# Patient Record
Sex: Female | Born: 1935 | Race: Black or African American | Hispanic: No | Smoking: Never smoker
Health system: Southern US, Community
[De-identification: ages and names within clinical notes are randomized; demographics above are authoritative.]

## PROBLEM LIST (undated history)

## (undated) DIAGNOSIS — D892 Hypergammaglobulinemia, unspecified: Secondary | ICD-10-CM

## (undated) DIAGNOSIS — D131 Benign neoplasm of stomach: Secondary | ICD-10-CM

## (undated) DIAGNOSIS — N189 Chronic kidney disease, unspecified: Secondary | ICD-10-CM

## (undated) DIAGNOSIS — G459 Transient cerebral ischemic attack, unspecified: Secondary | ICD-10-CM

## (undated) DIAGNOSIS — K2990 Gastroduodenitis, unspecified, without bleeding: Secondary | ICD-10-CM

## (undated) DIAGNOSIS — I10 Essential (primary) hypertension: Secondary | ICD-10-CM

## (undated) DIAGNOSIS — H8110 Benign paroxysmal vertigo, unspecified ear: Secondary | ICD-10-CM

## (undated) DIAGNOSIS — Z531 Procedure and treatment not carried out because of patient's decision for reasons of belief and group pressure: Secondary | ICD-10-CM

## (undated) DIAGNOSIS — K2970 Gastritis, unspecified, without bleeding: Secondary | ICD-10-CM

## (undated) DIAGNOSIS — IMO0001 Reserved for inherently not codable concepts without codable children: Secondary | ICD-10-CM

## (undated) DIAGNOSIS — H269 Unspecified cataract: Secondary | ICD-10-CM

## (undated) DIAGNOSIS — I639 Cerebral infarction, unspecified: Secondary | ICD-10-CM

## (undated) HISTORY — DX: Hypergammaglobulinemia, unspecified: D89.2

## (undated) HISTORY — DX: Benign paroxysmal vertigo, unspecified ear: H81.10

## (undated) HISTORY — DX: Cerebral infarction, unspecified: I63.9

## (undated) HISTORY — DX: Chronic kidney disease, unspecified: N18.9

## (undated) HISTORY — PX: REPLACEMENT TOTAL KNEE: SUR1224

## (undated) HISTORY — PX: PARTIAL HIP ARTHROPLASTY: SHX733

## (undated) HISTORY — DX: Transient cerebral ischemic attack, unspecified: G45.9

## (undated) HISTORY — DX: Gastroduodenitis, unspecified, without bleeding: K29.90

## (undated) HISTORY — DX: Unspecified cataract: H26.9

## (undated) HISTORY — DX: Benign neoplasm of stomach: D13.1

## (undated) HISTORY — DX: Gastritis, unspecified, without bleeding: K29.70

---

## 1999-08-16 ENCOUNTER — Emergency Department (HOSPITAL_COMMUNITY): Admission: EM | Admit: 1999-08-16 | Discharge: 1999-08-16 | Payer: Self-pay | Admitting: Emergency Medicine

## 1999-08-16 ENCOUNTER — Encounter: Payer: Self-pay | Admitting: Emergency Medicine

## 2000-05-05 ENCOUNTER — Encounter: Admission: RE | Admit: 2000-05-05 | Discharge: 2000-05-05 | Payer: Self-pay | Admitting: *Deleted

## 2001-10-16 ENCOUNTER — Encounter: Payer: Self-pay | Admitting: Emergency Medicine

## 2001-10-16 ENCOUNTER — Emergency Department (HOSPITAL_COMMUNITY): Admission: EM | Admit: 2001-10-16 | Discharge: 2001-10-16 | Payer: Self-pay | Admitting: Emergency Medicine

## 2002-04-09 ENCOUNTER — Encounter: Payer: Self-pay | Admitting: Orthopedic Surgery

## 2002-04-12 ENCOUNTER — Inpatient Hospital Stay (HOSPITAL_COMMUNITY): Admission: RE | Admit: 2002-04-12 | Discharge: 2002-04-27 | Payer: Self-pay | Admitting: Pulmonary Disease

## 2002-04-12 ENCOUNTER — Encounter: Payer: Self-pay | Admitting: Orthopedic Surgery

## 2002-11-19 ENCOUNTER — Other Ambulatory Visit: Admission: RE | Admit: 2002-11-19 | Discharge: 2002-11-19 | Payer: Self-pay | Admitting: Obstetrics and Gynecology

## 2003-02-25 ENCOUNTER — Encounter: Payer: Self-pay | Admitting: Internal Medicine

## 2003-02-25 ENCOUNTER — Encounter: Admission: RE | Admit: 2003-02-25 | Discharge: 2003-02-25 | Payer: Self-pay | Admitting: Internal Medicine

## 2003-09-30 ENCOUNTER — Emergency Department (HOSPITAL_COMMUNITY): Admission: AD | Admit: 2003-09-30 | Discharge: 2003-09-30 | Payer: Self-pay | Admitting: Family Medicine

## 2003-10-25 ENCOUNTER — Emergency Department (HOSPITAL_COMMUNITY): Admission: AD | Admit: 2003-10-25 | Discharge: 2003-10-25 | Payer: Self-pay | Admitting: Family Medicine

## 2004-01-30 ENCOUNTER — Inpatient Hospital Stay (HOSPITAL_COMMUNITY): Admission: AD | Admit: 2004-01-30 | Discharge: 2004-02-10 | Payer: Self-pay | Admitting: Internal Medicine

## 2004-01-30 ENCOUNTER — Encounter: Payer: Self-pay | Admitting: Internal Medicine

## 2004-01-30 DIAGNOSIS — K2970 Gastritis, unspecified, without bleeding: Secondary | ICD-10-CM

## 2004-01-30 DIAGNOSIS — K2990 Gastroduodenitis, unspecified, without bleeding: Secondary | ICD-10-CM

## 2004-01-30 DIAGNOSIS — D131 Benign neoplasm of stomach: Secondary | ICD-10-CM | POA: Insufficient documentation

## 2004-01-30 HISTORY — DX: Gastroduodenitis, unspecified, without bleeding: K29.90

## 2004-01-30 HISTORY — DX: Benign neoplasm of stomach: D13.1

## 2004-01-30 HISTORY — DX: Gastritis, unspecified, without bleeding: K29.70

## 2004-02-03 ENCOUNTER — Encounter (INDEPENDENT_AMBULATORY_CARE_PROVIDER_SITE_OTHER): Payer: Self-pay | Admitting: *Deleted

## 2004-02-18 ENCOUNTER — Encounter (HOSPITAL_COMMUNITY): Admission: RE | Admit: 2004-02-18 | Discharge: 2004-05-18 | Payer: Self-pay | Admitting: Oncology

## 2004-07-17 ENCOUNTER — Encounter: Admission: RE | Admit: 2004-07-17 | Discharge: 2004-07-17 | Payer: Self-pay | Admitting: Surgery

## 2005-01-28 ENCOUNTER — Ambulatory Visit: Payer: Self-pay | Admitting: Internal Medicine

## 2005-02-16 ENCOUNTER — Ambulatory Visit: Payer: Self-pay | Admitting: Oncology

## 2005-03-16 ENCOUNTER — Ambulatory Visit: Payer: Self-pay | Admitting: Internal Medicine

## 2005-04-07 ENCOUNTER — Inpatient Hospital Stay (HOSPITAL_COMMUNITY): Admission: RE | Admit: 2005-04-07 | Discharge: 2005-04-16 | Payer: Self-pay | Admitting: Orthopedic Surgery

## 2005-04-07 ENCOUNTER — Ambulatory Visit: Payer: Self-pay | Admitting: Physical Medicine & Rehabilitation

## 2005-04-08 ENCOUNTER — Ambulatory Visit: Payer: Self-pay | Admitting: Internal Medicine

## 2005-05-18 ENCOUNTER — Encounter: Admission: RE | Admit: 2005-05-18 | Discharge: 2005-06-10 | Payer: Self-pay | Admitting: Orthopedic Surgery

## 2005-07-08 IMAGING — CR DG CHEST 2V
2 series · 2 of 2 positions shown · non-contrast
Comparison: none

CLINICAL DATA: GI bleeding, anemia, renal insufficiency, preoperative respiratory exam.
 CHEST (TWO VIEWS)
 The heart is at the upper limits of normal in size.  The thoracic aorta is unfolded.  The lungs are clear.  No effusions.  No acute soft tissue or bony finding.
 IMPRESSION
 No active disease.

[view not recorded (1 of 2)]
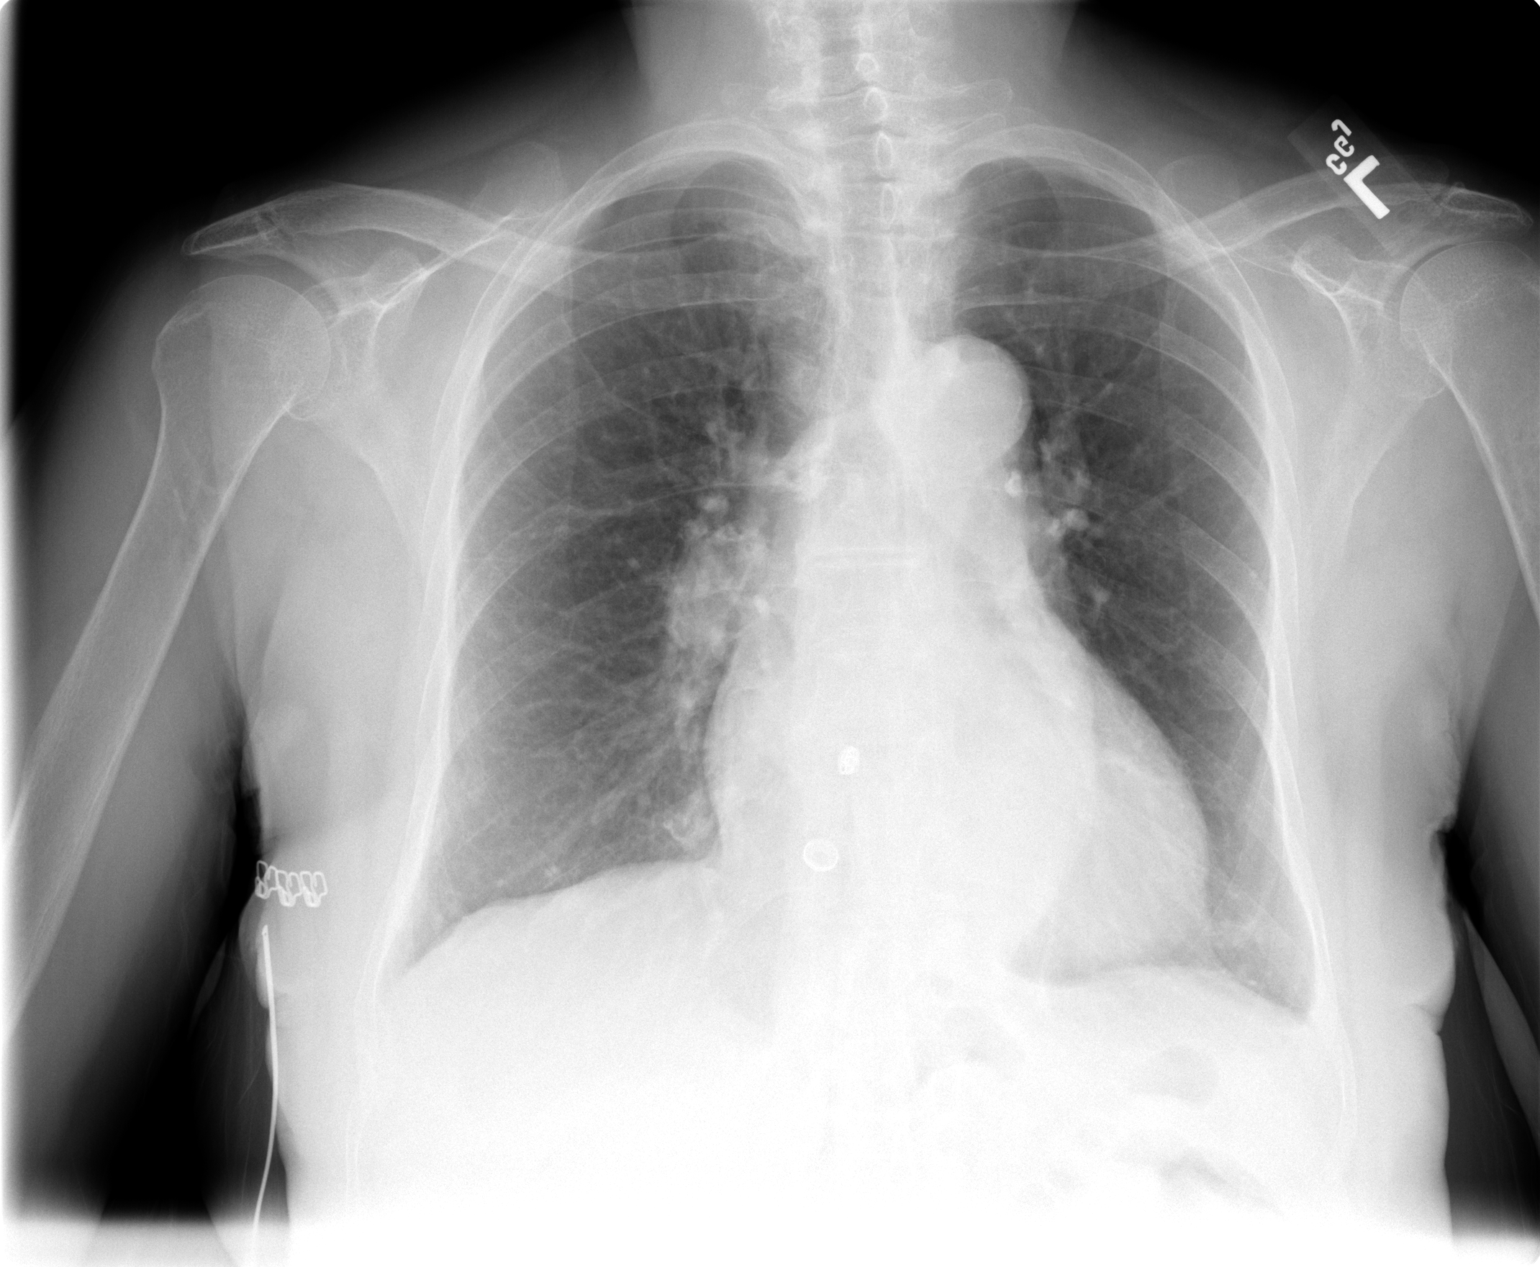

[view not recorded (2 of 2)]
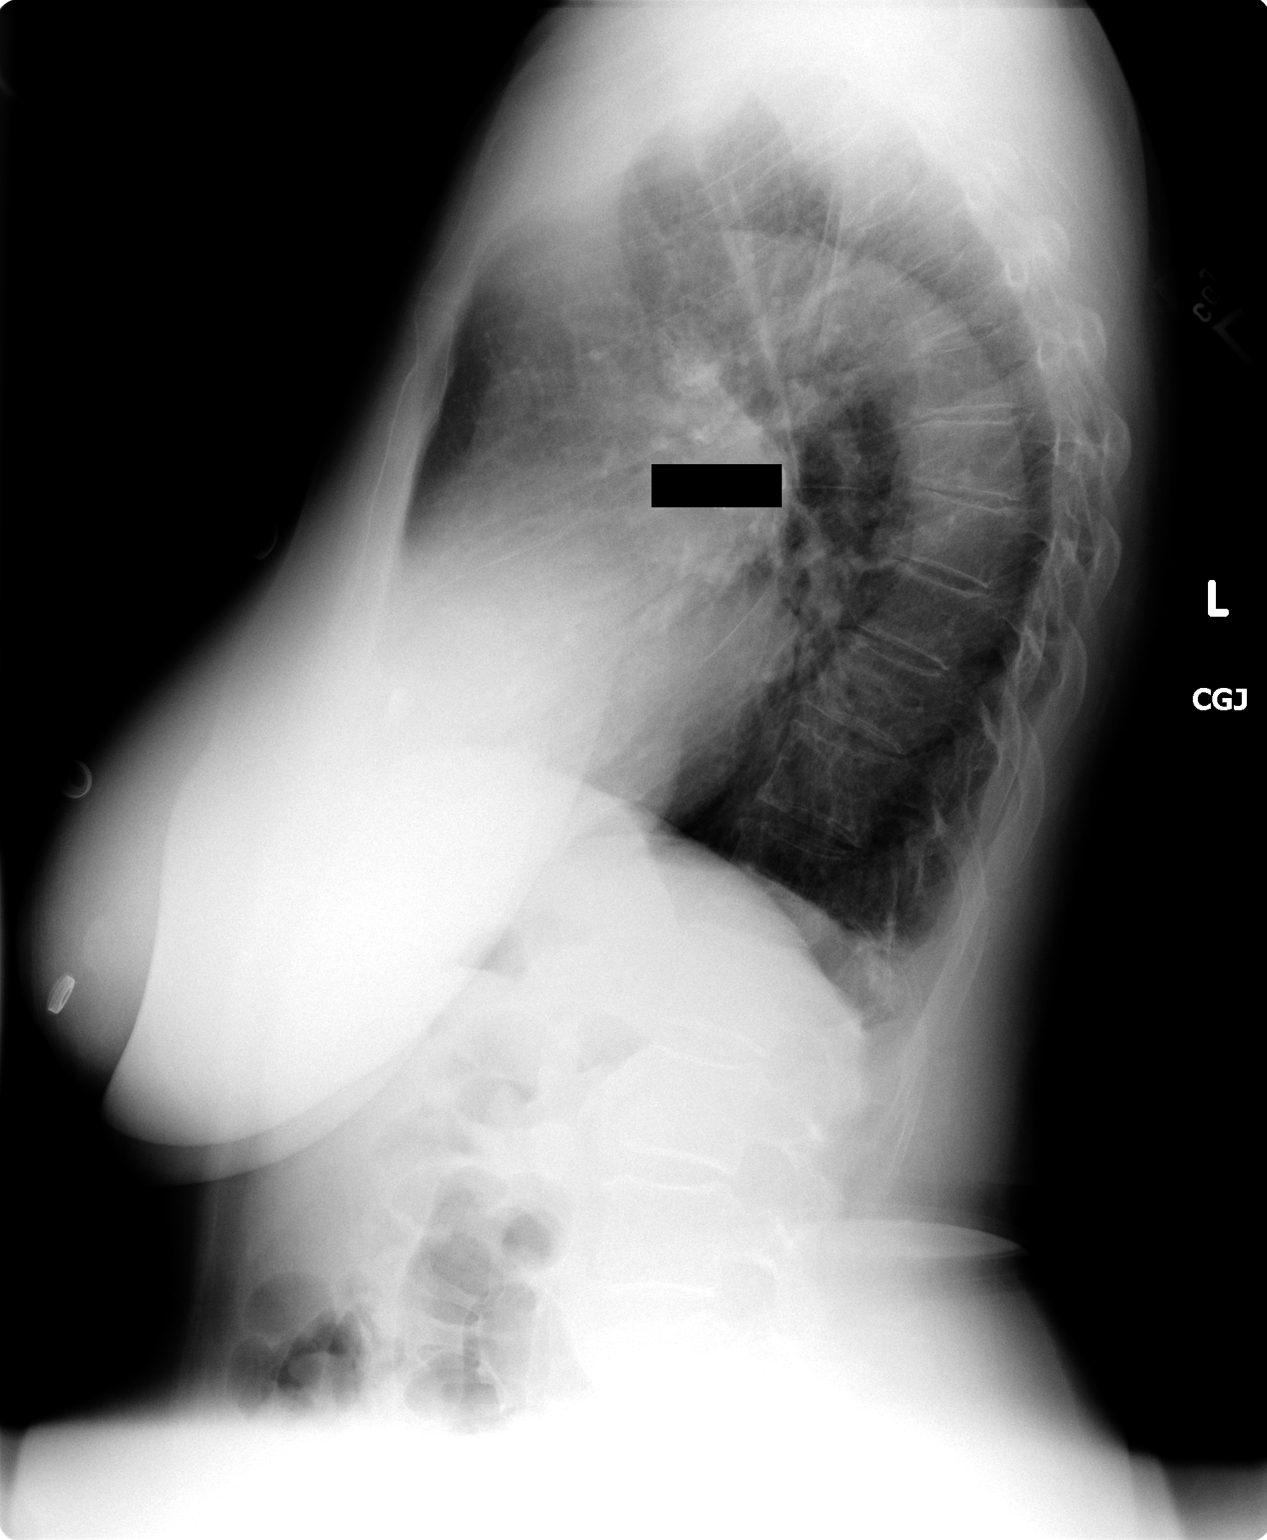

[2 of 2 positions shown; findings below may reference images not displayed]

## 2005-10-04 ENCOUNTER — Ambulatory Visit: Payer: Self-pay | Admitting: Internal Medicine

## 2005-10-08 ENCOUNTER — Ambulatory Visit: Payer: Self-pay | Admitting: Internal Medicine

## 2005-11-05 ENCOUNTER — Encounter: Admission: RE | Admit: 2005-11-05 | Discharge: 2005-11-05 | Payer: Self-pay | Admitting: Internal Medicine

## 2005-11-17 ENCOUNTER — Ambulatory Visit: Payer: Self-pay | Admitting: Endocrinology

## 2005-11-26 ENCOUNTER — Ambulatory Visit: Payer: Self-pay

## 2005-12-08 ENCOUNTER — Ambulatory Visit: Payer: Self-pay | Admitting: Internal Medicine

## 2006-06-08 ENCOUNTER — Encounter: Admission: RE | Admit: 2006-06-08 | Discharge: 2006-06-08 | Payer: Self-pay | Admitting: Orthopedic Surgery

## 2007-07-03 ENCOUNTER — Ambulatory Visit: Payer: Self-pay | Admitting: Internal Medicine

## 2007-07-06 ENCOUNTER — Ambulatory Visit: Payer: Self-pay | Admitting: Internal Medicine

## 2007-08-11 ENCOUNTER — Encounter: Payer: Self-pay | Admitting: *Deleted

## 2007-08-11 DIAGNOSIS — N259 Disorder resulting from impaired renal tubular function, unspecified: Secondary | ICD-10-CM | POA: Insufficient documentation

## 2007-08-11 DIAGNOSIS — E538 Deficiency of other specified B group vitamins: Secondary | ICD-10-CM

## 2007-08-11 DIAGNOSIS — M1990 Unspecified osteoarthritis, unspecified site: Secondary | ICD-10-CM | POA: Insufficient documentation

## 2007-08-11 DIAGNOSIS — K649 Unspecified hemorrhoids: Secondary | ICD-10-CM | POA: Insufficient documentation

## 2007-08-11 DIAGNOSIS — I10 Essential (primary) hypertension: Secondary | ICD-10-CM | POA: Insufficient documentation

## 2007-08-11 DIAGNOSIS — K5909 Other constipation: Secondary | ICD-10-CM

## 2007-11-03 ENCOUNTER — Encounter: Payer: Self-pay | Admitting: Internal Medicine

## 2007-11-03 ENCOUNTER — Ambulatory Visit: Payer: Self-pay | Admitting: Internal Medicine

## 2007-11-06 ENCOUNTER — Emergency Department (HOSPITAL_COMMUNITY): Admission: EM | Admit: 2007-11-06 | Discharge: 2007-11-06 | Payer: Self-pay | Admitting: Emergency Medicine

## 2008-02-08 DIAGNOSIS — H269 Unspecified cataract: Secondary | ICD-10-CM

## 2008-02-08 HISTORY — DX: Unspecified cataract: H26.9

## 2008-03-04 ENCOUNTER — Emergency Department (HOSPITAL_COMMUNITY): Admission: EM | Admit: 2008-03-04 | Discharge: 2008-03-04 | Payer: Self-pay | Admitting: Emergency Medicine

## 2008-06-10 ENCOUNTER — Emergency Department (HOSPITAL_COMMUNITY): Admission: EM | Admit: 2008-06-10 | Discharge: 2008-06-10 | Payer: Self-pay | Admitting: Emergency Medicine

## 2008-07-30 ENCOUNTER — Inpatient Hospital Stay (HOSPITAL_COMMUNITY): Admission: EM | Admit: 2008-07-30 | Discharge: 2008-08-04 | Payer: Self-pay | Admitting: Emergency Medicine

## 2008-07-31 ENCOUNTER — Ambulatory Visit: Payer: Self-pay | Admitting: Internal Medicine

## 2008-08-02 ENCOUNTER — Encounter: Payer: Self-pay | Admitting: Internal Medicine

## 2009-01-02 ENCOUNTER — Ambulatory Visit (HOSPITAL_BASED_OUTPATIENT_CLINIC_OR_DEPARTMENT_OTHER): Admission: RE | Admit: 2009-01-02 | Discharge: 2009-01-03 | Payer: Self-pay | Admitting: Orthopedic Surgery

## 2010-11-29 ENCOUNTER — Encounter: Payer: Self-pay | Admitting: Oncology

## 2011-02-23 LAB — POCT HEMOGLOBIN-HEMACUE: Hemoglobin: 10.6 g/dL — ABNORMAL LOW (ref 12.0–15.0)

## 2011-03-23 NOTE — Assessment & Plan Note (Signed)
[facility]                         GASTROENTEROLOGY OFFICE NOTE   NAME:Rohleder, MAIRE GOVAN                       MRN:          [id number]  DATE:07/03/2007                            DOB:          04-04-1923    ADDENDUM   PAST MEDICAL HISTORY:  B12 deficiency.  Prior appendectomy.  Prior right  total hip replacement.  Cataracts bilaterally.   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     Iva Boop, MD,FACG  Electronically Signed    CEG/MedQ  DD: 07/04/2007  DT: 07/04/2007  Job #: [id number]

## 2011-03-23 NOTE — Op Note (Signed)
NAMECHAROLETTE, BULTMAN                ACCOUNT NO.:  [id number]   MEDICAL RECORD NO.:  [id number]          PATIENT TYPE:  AMB   LOCATION:  NESC                         FACILITY:  [facility]   PHYSICIAN:  Ollen Gross, M.D.    DATE OF BIRTH:  December 17, 1922   DATE OF PROCEDURE:  01/02/2009  DATE OF DISCHARGE:  01/03/2009                               OPERATIVE REPORT   PREOPERATIVE DIAGNOSIS:  Right knee synovitis, questionable infection.   POSTOPERATIVE DIAGNOSES:  Right knee synovitis, questionable infection.   PROCEDURE:  Right knee arthroscopic irrigation, debridement and  synovectomy.   SURGEON:  Ollen Gross, M.D.   ASSISTANT:  None.   ANESTHESIA:  General.   BLOOD LOSS:  Minimal.   DRAIN:  Hemovac times one.   COMPLICATIONS:  None.   CONDITION:  Stable to recovery.   INDICATIONS:  Ms. Lindsay Shannon is an 77 year old female who had a right total  knee arthroplasty done approximately 4 years ago.  She did fine with no  problems at all, but then, a few weeks ago started to develop painful  swelling in the knee spontaneously.  She denied any trauma.  She denied  any recent systemic illnesses.  We did an aspiration in the office,  showing bloody fluid.  The cultures were all negative.  I did a  subsequent aspiration a week later and sent it for cell count also,  which had an elevated white count, but again negative cultures.  Given  her clinical scenario, it was decided to do an irrigation and  debridement arthroscopically.   PROCEDURE IN DETAIL:  After successful administration of general  anesthetic, a tourniquet was placed high on the right thigh and right  lower extremity prepped and draped in the usual sterile fashion.  Standard superomedial and inferolateral incisions were made, inflow  cannula passed superomedial and camera passed inferolateral.  Arthroscopic visualization proceeds.   There is some inflamed synovium in the suprapatellar area.  When we  opened the joint with  the trocars, bloody type fluid came out.  I did  not see any purulence.  We thoroughly irrigated the joint.  Through an  inferomedial portal I then placed a shaver to debride the synovium.  We  got it back to healthy, bleeding tissue.  I used the ArthroCare to  cauterize any bleeding points and to remove any of the remaining  synovium.  After thorough synovectomy was completed, we washed  approximately 12 liters of saline through the joint under pressure.  This effectively cleansed the joint and I then removed the arthroscopic  equipment from the inferior portals, which were closed with interrupted  4-0 nylon.  Then 20 mL of 0.25% Marcaine with epi injected through the  inflow cannula and then a  Hemovac drain was threaded through the cannula.  The cannula was  removed.  The incision was closed with interrupted 4-0 nylon, but the  drain is not sewn in.  The drain is hooked to suction and a bulky  sterile dressing is applied.  She is then awakened and transferred to  recovery in stable condition.  Ollen Gross, M.D.  Electronically Signed     FA/MEDQ  D:  01/02/2009  T:  01/03/2009  Job:  [id number]

## 2011-03-23 NOTE — H&P (Signed)
NAMEPAULETT, KAUFHOLD                ACCOUNT NO.:  [id number]   MEDICAL RECORD NO.:  [id number]          PATIENT TYPE:  INP   LOCATION:  6533                         FACILITY:  [facility]   PHYSICIAN:  Gwen Pounds, MD       DATE OF BIRTH:  1923-09-08   DATE OF ADMISSION:  07/30/2008  DATE OF DISCHARGE:                              HISTORY & PHYSICAL   PRIMARY CARE Rhyanna Sorce:  Vania Rea. Jarold Motto, MD, [facility], F[facility]   GASTROINTESTINAL:  Iva Boop, MD, [facility]   CHIEF COMPLAINT:  Rectal bleed with abdominal pain.   HISTORY OF PRESENT ILLNESS:  This is an 77 year old female who came to  the ED at 7:30 after noting abdominal discomfort, bright blood per  rectum followed by coughing up blood, positive nausea and vomiting  thereafter.  Now currently back to her baseline and no complaints.  She  reports that the bloody bowel movements are over.  The patient is  Jehovah's Witnesses and will not take blood products.  We will admit for  evaluation and treatment.  She has been seen by [facility], Dr. Leone Payor,  in the past and had EGDs and colonoscopies.  Of note, she has got  significant constipation in the past.  Her last colonoscopy, I do not  have results for it now, presumably this showed some polyps,  diverticula, and hemorrhoids.  Currently, feels better with lying down;  if gets up, she gets recurrent symptoms.  No other complaints.  She has  been weak and tired lately.  Four bloody stools at home, she reports.   PAST MEDICAL HISTORY:  1. Hypertension.  2. Constipation, status post taking Amitiza in the past.  3. History of bilateral cataracts.  4. History of right total hip replacement.  5. Chronic renal insufficiency.  6. B12 deficiency.  7. History of appendectomy.  8. History of hemorrhoids.  9. Duodenal stromal cell tumor, status post resection.  10.History of hysterectomy.   ALLERGIES:  No known drug allergies.   MEDICATION LIST:  1. Hydrochlorothiazide 12.5.  2.  Metamucil as needed.  3. Multivitamin p.o. daily.  4. Natural C.  5. Super B-complex.  6. Iron.  7. Potassium 20 mEq p.o. daily.  8. Calcium and D.   SOCIAL HISTORY:  She lives by herself.  No tobacco.  No alcohol.   FAMILY HISTORY:  Old age and tuberculosis.   REVIEW OF SYSTEMS:  She has been weak and tired.  No other complaints  except for what is in the HPI.  Feels better now.   PHYSICAL EXAMINATION:  VITAL SIGNS:  Blood pressure 121/74, heart rate  106, respiratory rate 21, and sating 100% on room air.  GENERAL:  Alert and oriented.  HEENT:  Oropharynx is mildly dry.  PULMONARY:  Clear to auscultation bilaterally.  CARDIAC:  Regular.  ABDOMEN:  Soft, nontender, and nondistended.  Bowel sounds positive.  RECTAL:  Done per ED shows dark stool, heme-positive.  EXTREMITIES:  No edema.   ANCILLARY DATA:  White count 8.6, hemoglobin 10.1, and platelet count  202.  Sodium 146, potassium  3.3, chloride 116, bicarb 22, BUN 31,  creatinine 2.38, glucose 148, and AST 26.  EKG showed sinus rhythm with  first-degree AV block and right bundle-branch block noted.  An i-STAT  was done afterwards and showed hemoglobin 8.8, I do not know the  accuracy of it.   ASSESSMENT:  This is an elderly female Jehovah's Witnesses with a  history of gastrointestinal bleeds, and history of colonoscopy and EGD  per [place] in the last 1 year, being admitted for rectal bleeding and  presumed diverticular bleed.   PLAN:  1. Admit.  2. Telemetry.  3. Follow CBCs.  4. No transfusions.  5. GI consult as needed.  6. If opened up and become hemodynamically unstable, the only options      are surgery, embolization, or comfort measures.  7. Follow blood pressure, hold hydrochlorothiazide, especially with      her creatinine of 2.4.  8. No aspirin, Plavix, or anticoagulation at this current time is      needed or warranted and may make things worse.  9. Follow creatinine.  10.Protonix in case of upper GI  bleed, although I doubt.  11.SCDs for DVT prophylaxis.  12.Dr. Jarold Motto will see her in the morning and further aggressive      care.  Clear liquids for now.      Gwen Pounds, MD  Electronically Signed     JMR/MEDQ  D:  07/30/2008  T:  07/31/2008  Job:  [id number]

## 2011-03-23 NOTE — Assessment & Plan Note (Signed)
[facility]                         GASTROENTEROLOGY OFFICE NOTE   NAME:Lindsay Shannon, Lindsay Shannon                       MRN:          [id number]  DATE:07/03/2007                            DOB:          01-07-1923    REFERRING PHYSICIAN:  Barry Dienes. Eloise Harman, M.D.   CHIEF COMPLAINT:  Heme-positive stool.   ASSESSMENT:  An 77 year old African-American woman who is having  problems with constipation, improved on Amitiza and also with recent  Hemoccult positive stool in May.  She has had an EGD demonstrating  stromal cell tumor January 11, 2003 status post resection.  That was in the  duodenum and has had a colonoscopy at that time, January 11, 2003,  demonstrating mixed hemorrhoids.   PLAN:  She needs a repeat GI workup given the constipation and recurring  heme-positive stool/changed bowel habits.  Plan for colonoscopy July 06, 2007.   Risks, benefits, and indications reviewed and explained.  She  understands and agrees to proceed.  If that is unrevealing, further  workup could include CT scanning, looking for any recurrent stromal cell  tumor and upper GI endoscopy.   HISTORY:  As above, this lady has developed problems with constipation.  She has been started on Amitiza and it is better.  She has had some left  lower quadrant discomfort, it sounds like, which improves when she  empties her bowels.  Her GI review of systems is otherwise negative.  Stool hemoccults coordinated by Dr. Eloise Harman were positive in May.  Laboratory investigation May 22, 2007 shows normal hemoglobin and  hematocrit.  Creatinine 2.4 at that time.   MEDICATIONS:  1. GlycoLax p.r.n.  2. Amitiza 24 mcg daily.   DRUG ALLERGIES:  None known.   PAST MEDICAL HISTORY:  1. Renal insufficiency.  2. Mixed hemorrhoids.  3. Duodenal stromal cell tumor status post resection.  4. Osteoarthritis in the right knee with right total knee arthroplasty      Apr 07, 2005.  5. Hypertension.  6.  History of constipation in the past as well.  7. Prior hysterectomy.  8. B12 deficiency.  9. Prior appendectomy.  10.Prior right total hip replacement.  11.Cataracts bilaterally.   FAMILY HISTORY:  Noncontributory.  No colon cancer.   SOCIAL HISTORY:  Francis Dowse is widowed.  She is here with a friend.  No  alcohol, tobacco, or drugs.  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   REVIEW OF SYSTEMS:  See my medical history form for full details.   PHYSICAL EXAM:  Reveals a spry elderly black woman looking somewhat  younger than stated age.  Weight 126 pounds, blood pressure 138/78, pulse 68.  Eyes anicteric.  HENT:  Dentures.  Otherwise, free of oral lesions.  No posterior  pharyngeal lesions.  NECK:  Supple.  No thyromegaly or mas.  CHEST:  Clear.  HEART:  S1, S2.  No murmurs, rubs, or gallops.  ABDOMEN:  Soft and nontender without organomegaly or mass.  RECTAL:  Deferred at this time.  LYMPHATICS:  No neck or supraclavicular nodes.  EXTREMITIES:  No peripheral edema  noted.  SKIN:  No acute rash.  Warm and dry.  PSYCH:  She is alert and oriented x3.   I appreciate the opportunity to care for this patient.     Iva Boop, MD,FACG  Electronically Signed    CEG/MedQ  DD: 07/04/2007  DT: 07/04/2007  Job #: [id number]   cc:   Barry Dienes. Eloise Harman, M.D.

## 2011-03-26 NOTE — Op Note (Signed)
[place]  Patient:    Lindsay, Shannon Visit Number: [id number] MRN: [id number]          Service Type: SUR Location: MICU 2102 01 Attending Physician:  Burnard Bunting Dictated by:   Cammy Copa, M.D. Proc. Date: 04/12/02 Admit Date:  04/12/2002                             Operative Report  PREOPERATIVE DIAGNOSIS:  Right hip arthritis.  POSTOPERATIVE DIAGNOSIS:  Right hip arthritis.  PROCEDURE:  Right total hip arthroplasty.  SURGEON:  Cammy Copa, M.D.  ASSISTANT:  Humberto Leep. Wyonia Hough, M.D.  ANESTHESIA:  General endotracheal.  ESTIMATED BLOOD LOSS:  950 cc.  DRAINS:  None.  DESCRIPTION OF PROCEDURE:  The patient was brought to the operating room where general endotracheal anesthesia was induced.  Preoperative IV antibiotics were administered.  The right hip, leg, and foot was prepped with DuraPrep solution, and draped in a sterile manner.  Collier Flowers was used in the operative field.  A posterior approach to the hip was utilized.  The skin and subcutaneous tissue were sharply divided.  Bleeding points were controlled using electrocautery.  The fascia lata was identified and divided over the greater trochanteric region.  The bursa was incised.  The external rotators were identified including the piriformis tendon.  The piriformis tendon was tagged and then detached.  The other external rotators were then detached which showed the capsule.  The capsule was split in a T-shaped manner.  At this time, a Steinmann pin was placed into the superior acetabular roof, and a drill bit was placed into the trochanter to measure leg length which was approximately 60 mm.  The hip was then removed and then the hip was dislocated.  A femoral neck cut was made with the oscillating saw about one fingerbreadth above the superior aspect of the lesser trochanter.  With the femoral neck cut made, the anterior acetabular retractor was placed.   The acetabular labrum was removed and the osteophytes around the anterior and posterior walls of the acetabulum were also removed with an osteotome.  The patient had a lot of _______ as well as one loose body in the hip joint itself which was removed.  At this time, the acetabulum was reamed up to a size 54, and according to the preoperative templating.  A 54 trial cup gave a good interfering fit.  The real hydroxyapatite-coated cup was then placed and two screws measuring 16 mm and 20 mm were placed to secure the cup into the posterior-superior quadrant of the acetabulum.  At this time, the femur was prepared.  A sequential cylindrical reaming and broaching was performed.  With a cement 7 broach in position, the hip was reduced with a +0 and +5 neck length.  The +5 neck length gave the best soft tissue tension and was stable on external rotation and full extension, and was also stable in a position of sleep as well as 90 degrees of hip flexion, slight adduction, and internal rotation to about 70 degrees.  At this time, the canal was prepared with a distal cement spacer.  Cement was then placed and a size 7 broach and a size 7 stem was cemented into position in approximately 15 degrees of anteversion.  The cement was allowed to harden. Trial reduction was again performed with the +0 and +5 head.  With the +5 head in position,  the patient was noted to be lengthened by about a centimeter. Preoperatively, he was noted to be about a quarter inch short on that side, and thus this was felt to be acceptable.  At this time, the true +5, 32 mm head was then tapped into [facility] taper stem, and the hip was reduced.  The true trial 10 mm trial liner was placed prior to cementing the stem into position.  At this time, the hip was thoroughly irrigated.  The capsule was closed using the #1 Ethibond suture.  The fascia lata was then closed using #1 Vicryl figure-of-eight sutures.  The skin was closed  using interrupted inverted 2-0 Vicryl and skin staples.  Impervious dressing and a pressure wrap was applied over the hip region.  The patient tolerated the procedure well without any immediate complications. A knee immobilizer was placed.  He was transferred to the recovery room in stable condition. Dictated by:   Cammy Copa, M.D. Attending Physician:  Burnard Bunting DD:  04/12/02 TD:  04/15/02 Job: [id number]

## 2011-03-26 NOTE — Discharge Summary (Signed)
[place]  Patient:    Lindsay Shannon, Lindsay Shannon Visit Number: [id number] MRN: [id number]          Service Type: SUR Location: 5000 [id number] Attending Physician:  Silvio Pate Dictated by:   Cammy Copa, M.D. Admit Date:  04/12/2002 Disc. Date: 04/27/02                             Discharge Summary  DISCHARGE DIAGNOSIS:  Right hip osteoarthritis status post total hip arthroplasty.  SECONDARY DIAGNOSIS:  Postoperative anemia.  OPERATIONS AND PROCEDURES:  Right total hip arthroplasty performed April 12, 2002.  CONSULTATIONS: 1. Critical care. 2. Cardiology.  HISTORY OF PRESENT ILLNESS:  The patient is a 77 year old [place] Witness who had right hip arthritis.  Her medical history is otherwise healthy.  HOSPITAL COURSE:  The patient was admitted to the orthopedic service on April 12, 2002.  At that time, she underwent total hip arthroplasty.  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  This was managed by a five day stay in the intensive care unit. Critical care consultation was obtained and EPO protocol was initiated.  The patient did go into transient atrial fibrillation on postoperative day three which resolved with medication. She was maintained on Lovenox for DVT prophylaxis.  She was transferred to the floor on April 18, 2002.  The patient was started with physical therapy for immobilization.  Incision was intact during the hospitalization. Dorsiflexion and plantar flexion of the feet were also intact.  The patients hemoglobin increased to 7.4 at the time of discharge.  She had no orthostatic hypertension and was ambulating in the halls and performing stairs at that time.  Bilateral lower extremity ultrasounds were negative for deep vein thrombosis.  She was switched from Lovenox to Coumadin on the day of discharge. She will continue with ambulation, weight bearing as tolerated. She will be  transferred to [facility] for two to three weeks and then home thereafter.  I will see her in follow up in three to four weeks.  DISCHARGE MEDICATIONS:  Admission medications plus iron and Coumadin.Dictated by:   Cammy Copa, M.D. Attending Physician:  Silvio Pate DD:  04/27/02 TD:  04/27/02 Job: [id number]

## 2011-03-26 NOTE — Consult Note (Signed)
NAME:  Lindsay Shannon, Lindsay Shannon                          ACCOUNT NO.:  [id number]   MEDICAL RECORD NO.:  [id number]                   PATIENT TYPE:  INP   LOCATION:  5736                                 FACILITY:  [facility]   PHYSICIAN:  Valentino Hue. Magrinat, M.D.            DATE OF BIRTH:  07-05-1923   DATE OF CONSULTATION:  DATE OF DISCHARGE:                                   CONSULTATION   We are asked to see this 77 year old female with a history of anemia and  abnormal serum protein electrophoresis.   HISTORY:  Lindsay Shannon is a 77 year old female who is __________ and has a  history of hypertension and degenerative joint disease.  She was admitted on  the 24th of March 2005 after 10 days of jelly-type hematochezia.  Upon  initial evaluation, her hemoglobin was approximately 8.4 gm/dl with a  creatinine of 2.0.  Unfortunately, I do not have any old records but it  appears that she progressively had a normal hemoglobin concentration.  She  subsequently was referred to see Lindsay Shannon and she had an EGD  performed which showed a mass in the prepyloric area with some ulcerations.  A test for H. pylori was negative.  She was subsequently admitted to the  inpatient service for surgical resection.  She had an exploratory laparotomy  performed on the 28th of March, 2005, with minimal blood loss.  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  As the patient is a  Jehovah's witness, she has not received any blood transfusions during her  hospital course.  She has been begun on treatment with Epogen 5000 units  three times a week, as well as intravenous iron.   Today, she denies any complaints.  She had good energy levels, having  improved over the last several days.  She is able to ambulate without  shortness of breath and she  denies any chest pain, orthopnea, or paroxysmal  nocturnal dyspnea.  She does have some mild, occasional cough, but denies  any fevers or chills.  She has no nausea, vomiting, or change in bowel  habits.  She denies any back pain, groin pain, but does have occasional  right knee pain which she attributes to her known history of degenerative  joint disease.  She denies any paresthesias.   PAST MEDICAL HISTORY:  1. Hypertension.  2. Degenerative joint disease.  3. Status post right hip replacement.  4. Status post appendectomy.  5. Status post hysterectomy.  6. She had a colonoscopy in March 2004 which was essentially unremarkable     except for internal and external hemorrhoids.   CURRENT MEDICATIONS:  1. Epogen 5000 units subcu three times a week.  2. Iron.  3. Sucrose 100  mg IV q.24h.  4. Protonix 40 mg p.o. q.d.  5. Tylenol 50 mg p.o. q.6h. p.r.n. for pain.  6. Benadryl 25 mg p.o. q.4h. p.r.n.  7. Phenergan 12.5-25 mg IV q.4h. p.r.n. for nausea and vomiting.  8. Senna/Docusate one tablet p.o. q.d.  9. Lortab one tablet p.o. q.4h. p.r.n. for pain.   She has no known drug allergies.   PERSONAL HISTORY:  She is a widow and previously worked as a Games developer.  She has no children and lives alone.  She is a TEFL teacher witness.  She  denies any history of smoking or alcohol abuse.   FAMILY HISTORY:  Significant for her mother dying from complications of  tuberculosis, her father died from unknown cause.  Her sister has a history  of a cancer of the bone and her brother died from complications of throat  cancer.  She has a sister who is currently on dialysis.   REVIEW OF SYSTEMS:  CARDIOVASCULAR:  She denies any chest pain,  palpitations, or syncope.  RESPIRATORY:  She has no shortness of breath,  orthopnea, paroxysmal nocturnal dyspnea, but has a mild, nonproductive  cough.  GI:  She has no nausea or vomiting or change in bowel habits.  The  rest of her systems have been  reviewed and are as per the history of present  illness.   PHYSICAL EXAMINATION:  VITAL SIGNS:  She is afebrile with a temperature of  99.8 degrees Fahrenheit.  Blood pressure 121/70; pulse 82 per minute;  respirations 22 per minute.  Oxygen saturation is 100% on room air.  GENERAL:  She is found to be an elderly female in no acute distress who  looks younger than her stated age.  HEENT:  Her pupils are equal, round, and  reactive to light.  She is anicteric with pale conjunctivae.  Her oropharynx  is clear with no thrush or petechiae.  NECK:  Supple with no jugular venous  distention.  LYMPH NODES:  She has no palpable adenopathy.  CHEST:  Lung  sounds are clinically clear.  HEART:  First and second heart sounds are  heard with a soft systolic murmur.  ABDOMEN:  Full, soft, and she is status  post recent surgery with a vertical incision line which appears clean.  She  has mild right lower quadrant tenderness but she has no guarding or rebound.  She has palpable hepatosplenomegaly.  She has normal bowel sounds.  EXTREMITIES:  Reveal no edema, cyanosis, or clubbing.  CNS:  Grossly  nonfocal.   LABS:  White count 5.4, hemoglobin 8.2, hematocrit 24.3, platelet count 266,  MCV 93.4.  Most recent chemistries obtained on the 29th of March revealed a  sodium of 141, potassium 4.5, chloride 108, bicarbonate 26, creatinine 1.2,  BUN 12, glucose 142, bilirubin 1.0, alkaline phosphatase 70, SGOT 42, SGPT  16, total protein 5.7, albumin 3.2, calcium 8.4.  Serum protein  electrophoresis revealed an IgA of 290, IgG 1120, IgM 57, and she had a  protein which was quantitated at 0.044 gm/dl.  Immunofixation revealed a  monoclonal gammopathy with IgG kappa and IgA kappa proteins present.  Microglobulin was 5.4.   ASSESSMENT/PLAN:  Lindsay Shannon is a 77 year old female admitted following the  recent upper gastrointestinal bleed.  ONCOLOGY--Her pathology is consistent with a benign gastrointestinal  stromal  tumor.  It appears that the tumor has been completely excised.  There is no  need for further therapy at this time.   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  Unfortunately, I do not have any old records confirming her  previous normal blood counts.  However, I think it is reasonable to continue  with Epogen and iron.  I would recommend that she obtain Epogen at 20,000  units subcu q.week for convenience.  I would also consider changing her iron  to oral iron, particularly upon discharge to Niferex 160 mg p.o. daily.   For her monoclonal gammopathy, we would need to rule out multiple myeloma.  She will need to get a urine protein electrophoresis while she is in the  hospital, as well as a skeletal survey to rule out lytic lesions.  She will  also require a bone marrow aspiration and biopsy which will be done while  she is in-house or if she chooses to follow up in clinic.  Dr. Darnelle Catalan will  follow the patient before she is discharged and may see her in clinic in  followup.     Tish Frederickson. Sherwood Gambler, MD                      Valentino Hue Magrinat, M.D.    Duane Boston  D:  02/09/2004  T:  02/10/2004  Job:  [id number]

## 2011-03-26 NOTE — Op Note (Signed)
NAME:  Lindsay Shannon, Lindsay Shannon                          ACCOUNT NO.:  [id number]   MEDICAL RECORD NO.:  [id number]                   PATIENT TYPE:  INP   LOCATION:  5725                                 FACILITY:  [facility]   PHYSICIAN:  Abigail Miyamoto, M.D.              DATE OF BIRTH:  February 05, 1923   DATE OF PROCEDURE:  02/03/2004  DATE OF DISCHARGE:                                 OPERATIVE REPORT   PREOPERATIVE DIAGNOSIS:  Gastric tumor.   POSTOPERATIVE DIAGNOSIS:  Gastric tumor.   PROCEDURE:  Exploratory laparotomy with resection of gastric tumor.   SURGEON:  Abigail Miyamoto, M.D.   ASSISTANT:  Thornton Park. Daphine Deutscher, M.D.   ANESTHESIA:  General endotracheal anesthesia.   ESTIMATED BLOOD LOSS:  Minimal.   INDICATIONS:  Lindsay Shannon is a 77 year old female Jehovah's Witness who was  found to be having progressively worsening anemia.  She had an upper  endoscopy by Iva Boop, M.D., after being admitted by her primary care  physician, Dr. 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  Follow-up CT scan showed indeed the 2-3 cm tumor in the anterior wall of the  stomach.  Given these findings, a decision was made to proceed with  exploration.   FINDINGS:  The patient was indeed to found to have an approximate 3 cm  submucosal mass in the anterior wall of the stomach.   PROCEDURE IN DETAIL:  The patient was brought to the operating room and  identified as Lindsay Shannon.  She was placed supine on the operating room  table and general endotracheal anesthesia was induced.  Her abdomen was then  prepped and draped in the usual sterile fashion.  Using a #10 blade, an  upper midline incision was then created.  The incision was carried down  through the fascia with the electrocautery.  The peritoneum was then opened  the entire length of the incision and the falciform ligament was taken down  with the  electrocautery.  The stomach was then examined and the mass was  found to be along the greater curvature of the stomach anteriorly and in the  submucosal area.  The omentum to the stomach was then taken down with  hemostats and 2-0 silk ties.  The mass was then identified  circumferentially.  Stay sutures consisting of 2-0 silk were placed  circumferentially around the mass.  An ellipse was then made around the mass  longitudinally through the stomach wall.  This was taken down into the lumen  of the stomach.  The entire gastric wall was then removed circumferentially  around the submucosal mass.  The mass was then sent to pathology for  identification.  The longitudinal incision was then closed in a transverse  fashion with interrupted 2-0 silk sutures.  Excellent hemostasis appeared to  be achieved in  the suture line and the lumen appeared patent.  The abdomen  was then irrigated with normal saline. Hemostasis appeared to be  achieved.  The midline was then closed with a running #1 PDS suture.  Skin  was then irrigated and closed with skin staples.  The patient tolerated the  procedure well.  All counts were correct at the end of the procedure.  The  patient was then extubated in the operating room and taken in stable  condition to the operating room.                                               Abigail Miyamoto, M.D.    DB/MEDQ  D:  02/03/2004  T:  02/03/2004  Job:  [id number]   cc:   Iva Boop, M.D. [facility]   Rosalyn Gess. Norins, M.D. [facility]

## 2011-03-26 NOTE — H&P (Signed)
NAMEVENNIE, Lindsay Shannon                ACCOUNT NO.:  [id number]   MEDICAL RECORD NO.:  [id number]          PATIENT TYPE:  INP   LOCATION:  0012                         FACILITY:  [facility]   PHYSICIAN:  Ollen Gross, M.D.    DATE OF BIRTH:  1923/07/31   DATE OF ADMISSION:  04/07/2005  DATE OF DISCHARGE:                                HISTORY & PHYSICAL   CHIEF COMPLAINT:  Right knee pain.   HISTORY OF PRESENT ILLNESS:  A 77 year old female with a one to two-year  history of worsening right knee pain.  No specific injury.  She has been  seen in the past by Dr. Burnard Bunting who did a right hip replacement about  two years ago.  She has noted problems in the knee following that which have  been progressive.  She was seen in the office by Dr. Lequita Halt, where x-rays  of the right hip show the prosthesis to be in good position.  No  paraprosthetic abnormalities.  Unfortunately, the right knee x-ray shows  severe end stage arthritis in the right knee with bone-on-bone loss in the  lateral compartment and some lateral femoral condyle erosion.  It is felt  she has reached the point where she could benefit undergoing total knee  replacement.  Risks and benefits are discussed.  The patient is subsequently  admitted to the hospital.   ALLERGIES:  No known drug allergies.   CURRENT MEDICATIONS:  1.  Hydrochlorothiazide 25 mg daily.  2.  Enulose 10 mg/15 ml one tablespoon b.i.d.   PAST MEDICAL HISTORY:  1.  Hypertension.  2.  Hemorrhoids.  3.  History of constipation.  4.  Past history of upper gastrointestinal bleed, March 2005.  5.  Benign gastric tumor.  6.  History of renal insufficiency.   PAST SURGICAL HISTORY:  1.  Appendectomy.  2.  Partial hysterectomy.  3.  Colonoscopy.   SOCIAL HISTORY:  Widowed, nonsmoker, no alcohol, no children.   FAMILY HISTORY:  Sister with a history of hypertension and a sister with a  history of cancer.   REVIEW OF SYSTEMS:  GENERAL:  No fevers, chills,  night sweats.  NEURO:  No  seizures, syncope, paralysis.  She does have a little bit of blurred vision.  RESPIRATORY:  No shortness of breath, productive cough, or hemoptysis.  CARDIOVASCULAR:  No chest pain, angina, orthopnea.  GI:  History of  constipation which she takes Enulose for.  A history of hemorrhoids but no  recent flares.  No blood or mucus in the stool.  No nausea or vomiting.  GU:  History of urinary frequency and nocturia.  No dysuria, hematuria,  discharge.  MUSCULOSKELETAL:  Right knee found in history of present  illness.   PHYSICAL EXAMINATION:  VITAL SIGNS:  Pulse 64, respirations 12, blood  pressure 162/84.  GENERAL:  A 77 year old [place] American female, well nourished, well  developed, no acute distress, alert, oriented, cooperative.  HEENT:  Normocephalic, atraumatic.  Pupils round reactive.  Oropharynx  clear.  EOMs are intact.  Upper and lower dentures noted.  NECK:  Supple.  No carotid bruits.  CHEST:  Clear anterior posterior chest wall.  No rhonchi, rales, or  wheezing.  HEART:  Regular rate and rhythm.  No murmurs.  ABDOMEN:  Soft, nontender.  Bowel sounds present.  RECTAL/BREASTS/GENITALIA:  Not done, not pertinent to present illness.  EXTREMITIES:  Right knee:  Right knee shows a valgus malalignment deformity  over 25 degrees with standing.  She does ambulate with an antalgic gait.  She has an unsteady gait.  Range of motion in the knee of 5-115 degrees.   IMPRESSION:  1.  Osteoarthritis, right knee with severe valgus malalignment deformity.  2.  Renal insufficiency.  3.  Hypertension.  4.  History of gastrointestinal bleed requiring hospitalization, March 2005.  5.  History of benign gastric tumor.  6.  Constipation.   PLAN:  The patient is admitted to [facility] to undergo a right  total knee arthroplasty.  The surgery will be performed by Dr. Ollen Gross.  The patient does live alone and wants to look into inpatient   rehabilitation services postoperatively.   The patient's medical physician is Dr. Debby Bud.  Dr. Debby Bud will be notified  of the room number on admission, be consulted if needed for medical  assistance with the patient throughout the hospital course.      ALP/MEDQ  D:  04/07/2005  T:  04/07/2005  Job:  [id number]   cc:   Rosalyn Gess. Norins, M.D. [facility]   Ollen Gross, M.D.  [facility]  [address]  [address]  [place]  [place] [address]  Fax: [phone]

## 2011-03-26 NOTE — Consult Note (Signed)
[place]  Patient:    Lindsay Shannon, Lindsay Shannon Visit Number: [id number] MRN: [id number]          Service Type: SUR Location: MICU 2102 01 Attending Physician:  Burnard Bunting Dictated by:   Charlcie Cradle Delford Field, M.D. [facility] Proc. Date: 04/12/02 Admit Date:  04/12/2002   CC:         Cammy Copa, M.D.   Consultation Report  CHIEF COMPLAINT:  Shock with anemia status post total hip replacement.  HISTORY OF PRESENT ILLNESS:  A 77 year old African-American female, a Jehovahs Witness, status post right total hip replacement postoperatively, hemoglobin is now down to 5.8, it was 13.1 preop. The patient has hypotension and shock postop. Denies chest pain, denies any shortness of breath. Pre-existing history of hypertension and chronic headaches and severe osteoarthritis only. Postoperatively the patient now went from the recovery room to the TCU and it was apparent the patient required ICU monitoring, therefore now transferred to intensive care unit for further monitoring.  PAST MEDICAL HISTORY:  Medical as noted above, history of hypertension.  SOCIAL HISTORY:  Does not smoke or drink.  ALLERGIES:  None.  PREOPERATIVE MEDICATIONS: 1. Atenolol 75 mg daily. 2. Trileptal 150 mg p.r.n.  PAST SURGICAL HISTORY:  None except as noted above.  FAMILY HISTORY:  Otherwise noncontributory.  PHYSICAL EXAMINATION:  VITAL SIGNS:  Temp is 98, blood pressure 96/60, heart rate 100, respirations 14.  GENERAL:  An ill-appearing African-American female in no acute distress.  CHEST:  Clear bilaterally to auscultation and percussion. There is no evidence of wheeze or rhonchi.  CARDIAC:  Resting tachycardia without S3, normal S1 & S2.  ABDOMEN:  Soft, nontender, bowel sounds hypoactive.  EXTREMITIES:  Cool, poorly perfused.  NEUROLOGICAL:  Intact. The patient moves all fours.  LABORATORY AND ACCESSORY DATA:  Hemoglobin has dropped down to 5.8 from  13.1 preop. Preop white count 8.6. Preop sodium 140, potassium 2.9, chloride 107, CO 2 26, BUN 20, creatinine 1.6, blood sugar 92.  Chest x-ray preop showed no active disease. EKG preop showed sinus bradycardia, first-degree AV block.  IMPRESSION:  Severe acute blood loss anemia status post right total hip replacement in a Jehovahs Witness.  RECOMMENDATIONS:  Begin Epo protocol with erythropoietin and iron. Minimize blood draws. Give colloids for volume expansion. Administer Levophed for pressor support. Administer oxygen. Transfer to ICU, close monitoring involved. Dictated by:   Charlcie Cradle Delford Field, M.D. [facility] Attending Physician:  Burnard Bunting DD:  04/12/02 TD:  04/14/02 Job: [id number]

## 2011-03-26 NOTE — Op Note (Signed)
NAMECHARLCIE, Lindsay Shannon                ACCOUNT NO.:  [id number]   MEDICAL RECORD NO.:  [id number]          PATIENT TYPE:  INP   LOCATION:  0012                         FACILITY:  [facility]   PHYSICIAN:  Ollen Gross, M.D.    DATE OF BIRTH:  1923-08-02   DATE OF PROCEDURE:  04/07/2005  DATE OF DISCHARGE:                                 OPERATIVE REPORT   PREOPERATIVE DIAGNOSIS:  Osteoarthritis, right knee.   POSTOPERATIVE DIAGNOSIS:  Osteoarthritis, right knee.   PROCEDURE:  Right total knee arthroplasty.   SURGEON:  Ollen Gross, M.D.   ASSISTANT:  Avel Peace, PA-C.   ANESTHESIA:  General.   ESTIMATED BLOOD LOSS:  200.   DRAINS:  Hemovac x1.   TOURNIQUET TIME:  Thirty-six minutes at 300 mmHg.   COMPLICATIONS:  None.   CONDITION:  Stable to the recovery room.   CLINICAL NOTE:  Lindsay Shannon is a 77 year old female with a severe end-stage  arthritic change of the right knee with severe valgus deformity.  She has  had pain and instability refractory to nonoperative management and presents  now for total knee arthroplasty.   PROCEDURE IN DETAIL:  After successful administration of general anesthetic,  a tourniquet is placed high on her right thigh and right lower extremity  prepped and draped in the usual sterile fashion.  The extremity is wrapped  in esmarch, the knee flexed, and the tourniquet inflated to 300 mmHg.  A  standard midline incision was made with a 10 blade through the subcutaneous  tissue to the level of the extensor mechanism.  A fresh blade is used to  make a lateral parapatellar arthrotomy, given the severe valgus deformity.  The soft tissue over the proximal medial tibia is subperiosteally elevated  to the joint line with a knife.  The patella is then everted medially.  The  ACL and PCL removed.  The drill is used to create a starting hole in the  distal femur, and the canal is irrigated.  A 5 degree right valgus alignment  guide is placed, and referencing off  the posterior condyle, rotation is  marked in a block pin to remove approximately 10 mm off the distal femur.  Distal femoral resection is made with an oscillating saw.  The sizing block  is placed, and size 3 is most appropriate.  The size 3 cutting block is  placed, rotating the block on the epicondylar axis, and then the anterior  and posterior chamfer cuts are made.   The tibia is subluxed forward, and the menisci are removed.  An  extramedullary tibial alignment guide is placed, referencing proximally at  the medial aspect of the tibial tubercle and distally along the second  metatarsal axis of the tibial crest.  We removed approximately 2 mm off the  deficient lateral side, as there was a pretty large defect.  Tibial  resection is made with an oscillating saw.  The size 2.5 is the most  appropriate tibial component, and then the proximal tibia is prepared with a  modular drill and keel punch for a size 2.5  Femoral  preparation is  completed with the intercondylar cut for the size 3.   A size 2.5 mobile-bearing tibial tray and size 3 posterior stabilized femur,  and a 10 mm posterior stabilized rotating platform insert trial was placed.  With the 10, full extension is achieved with excellent varus and valgus  balance throughout with full range of motion.  The patella is again everted  medially, and thickness measured to be 20 mm.  Free-hand resection is taken  to 12 mm.  A 35 template is placed.  Lug holes are drilled.  The trial  patella is placed, and it tracks normally.  The osteophytes are then removed  off the posterior femur with a trial in place.  All trials were removed,  then the cut-bone surfaces are prepared with pulsatile lavage.  Cement is  mixed, and once ready for implantation, the size 2.5 mobile-bearing tibial  tray, size 3 posterior stabilized femur, and 35 patella are cemented into  place.  The patella is held with a clamp.  A trial 10 mm insert is placed.  The  knee held in full extension, and all extruded cement is removed.  Once  the cement is fully hardened, then the permanent 10 mm posterior stabilized  rotating platform insert is placed into the tibial tray.  The wound is  copiously irrigated with saline solution.  The tourniquet is released for a  total time of 36 minutes.  Minor bleeding stopped with cautery.  The  arthrotomy is closed over a Hemovac drain with #1 interrupted PDS, leaving  an open area laterally from the inferior to superior pole of the patella to  effectively serve as a mini lateral release to assist with tracking.  The  subcu is closed with interrupted 2-0 Vicryl and the subcuticular with  running 4-0 Monocryl.  The incision is cleaned and dried, and Steri-Strips  and a bulky sterile dressing applied.  She is placed into a knee  immobilizer, awakened, and transported to recovery in stable condition with  2+ dorsalis pedis pulses.      FA/MEDQ  D:  04/07/2005  T:  04/07/2005  Job:  [id number]

## 2011-03-26 NOTE — Discharge Summary (Signed)
NAMEJEMIA, Lindsay Shannon                ACCOUNT NO.:  [id number]   MEDICAL RECORD NO.:  [id number]          PATIENT TYPE:  INP   LOCATION:  1409                         FACILITY:  [facility]   PHYSICIAN:  Ollen Gross, M.D.    DATE OF BIRTH:  [birth year]   DATE OF ADMISSION:  04/07/2005  DATE OF DISCHARGE:  04/16/2005                                 DISCHARGE SUMMARY   ADMISSION DIAGNOSES:  1.  Osteoarthritis right knee with severe valgus alignment deformity.  2.  Renal insufficiency.  3.  Hypertension.  4.  History of gastrointestinal bleeding requiring hospitalization.  5.  History of benign gastric tumor.  6.  Constipation.   DISCHARGE DIAGNOSES:  1.  Osteoarthritis right knee status post right total knee arthroplasty.  2.  Postoperative blood loss anemia.  3.  Hypokalemia improved.  4.  Chronic renal insufficiency.  5.  Postoperative hypotension multifactorial.  6.  Hypertension.  7.  History of gastrointestinal bleeding requiring hospitalization.  8.  History of benign gastric tumor.  9.  Constipation.   PROCEDURE:  Apr 07, 2005 right total knee arthroplasty, surgeon Ollen Gross, M.D., assistant Avel Peace, P.A.-C., anesthesia general, 200 mL  blood loss. Hemovac drain x1. Tourniquet time x36 minutes at 300 mmHg.   BRIEF HISTORY:  Ms. Furnari is a 77 year old female with severe end-stage  arthritic changes of the right knee with severe valgus deformity. The pain  has been refractory to nonoperative management and now presents for a total  knee.   CONSULTATIONS:  1.  [place] hospitalist internal medicine, Rene Paci, M.D.  2.  Rehab services.   HOSPITAL COURSE:  The patient was admitted to [facility], taken to  the OR, underwent above procedure without complications. The patient  tolerated the procedure well, later transferred to the recovery room and  then to the orthopedic floor to continue postop care. The patient did have  some hypotension which was  asymptomatic initially following the surgery but  she was noted to have some acute blood loss anemia, hemoglobin of 7.2. She  was started on Procrit. A hemovac drain placed at the time of surgery. Also  had known chronic renal insufficiency. Held her hydrochlorothiazide,  consulted internal medicine hospitalist with Louis Meckel,  M.D., to assist with medical management. Also consulted rehab [facility]  to see  if possibility of staying on the rehab floor for a short period of time. She  was doing well by day two. She had already been up walking 120 feet.  Fortunately her blood pressure remained low and was seen by Dr. Felicity Coyer and  felt to be due to the volume loss. Aggressive IV fluid resuscitation. She  was known to have chronic renal insufficiency also as her BUN and creatinine  was followed very closely and actually remained very stable postoperatively.  Due to religious beliefs being Jehovah's Witness, she refused blood  products. She was also seen by [facility]  and felt that she would be a good  patient in the [facility]  setting. She would be transferred at which time she was  stable. Unfortunately  her hemoglobin continued to decline down, it was noted  as low as 5.5 on day two. Blood pressure was still low; however, she does  not have any symptoms. Creatinine was holding in at 1.7. She was therapeutic  on her INR; however, due to her low hemoglobin and history of GI problems,  Coumadin was held, stools were monitored for any type blood loss. It was  decided if her hemoglobin continued to drop any lower that she would be  transferred to the ICU for strict monitoring. She was on telemetry postop.  All therapy was held until her hemoglobin was back above 6. Hemoglobin  declined a little bit further down to 5.3 by postop day three although again  she remained asymptomatic. All ambulation was held. She remained at bedrest.  She did use the CPM. She remained at bedrest until the hemoglobin  was back  up and drifted a little bit further down to 4.8. Again she had bee placed on  Procrit and it was noted that she still remained asymptomatic even at this  point. However, by postoperative day 5, her hemoglobin had turned around and  started trending back up. She was noted to be 5.1. The knee was looking  fantastic postoperatively, healing well, very little swelling, no obvious  bleed intraarticularly. She denies any symptoms with the anemia, her renal  function was holding steady compared with her preop levels. Once her  hemoglobin was back up, it came back up to 5.7, her therapy was resumed. On  postoperative day six, she slowly started getting up with physical therapy  and she actually walked 400 feet with a rolling walker and did feel fatigue  and tired after this. Hemoglobin was back up to 5.9 by postoperative day  seven. The incision continued to heal well. There was a question of whether  she would go to [facility]  versus skilled nursing facility. The patient's family  preferred [facility] . By postoperative day eight, the patient was sitting up in  the bed, no complaints. She had had some difficulty after the massive amount  of therapy that she performed the day before. When the events were reviewed,  she actually did have a slight syncopal episode secondary to her anemia;  however, by day eight, this had resolved and she was feeling much better.  Hemoglobin was back up to 6.2, steady on the incline. It was noted that the  patient was doing quite well. She had progressed well with physical therapy  and decided that she wanted to go home. Arrangements were made for the  patient to receive home health therapy. Once all arrangements had been made,  the patient was discharged home on April 16, 2005.   PLAN:  1.  The patient discharged home on April 16, 2005.  2.  Discharge diagnoses please see above. 3.  Discharge meds:  Continue current home medications, aspirin, Trinsicon,      Percocet  and Robaxin.   DIET:  Resume previous home diet.   ACTIVITY:  Weightbearing as tolerated right lower extremity, continue gait  training, ambulation, ADL's as per home health PT and home health nursing.   No Coumadin protocol postoperatively. Aspirin daily. Followup two weeks from  surgery. Call the office at [phone].   DISPOSITION:  Home with family.   CONDITION ON DISCHARGE:  Improving.       ALP/MEDQ  D:  05/19/2005  T:  05/19/2005  Job:  [id number]   cc:   Rosalyn Gess. Norins, M.D.  [facility]   Rehab [facility]

## 2011-03-26 NOTE — Consult Note (Signed)
[place]  Patient:    Lindsay, Shannon Visit Number: [id number] MRN: [id number]          Service Type: SUR Location: MICU 2102 01 Attending Physician:  Burnard Bunting Dictated by:   Noralyn Pick Eden Emms, M.D. [facility] Admit Date:  04/12/2002                            Consultation Report  HISTORY OF PRESENT ILLNESS:  Lindsay Shannon is a 77 year old [place] Witness who is just status post right hip arthroplasty.  She lost some blood during her surgery and her hemoglobin dropped to the 3-4 range.  She does not have a previous cardiac history.  She has a history of hypertension, non-insulin-dependent diabetes.  She has been fairly asymptomatic postoperatively.  However, she developed rapid atrial fibrillation with a rate of 130-140.  Her baseline pressure was only in the 95 range and it dropped to the 70 range. Dr. Sherene Sires had been seeing her and started the patient on Neo-Synephrine and Cardizem drip.  She is currently stable but I would agree with him that it is better to proceed with cardioversion given the fact that she cannot take blood thinners and her hemodynamics are somewhat tenuous.  The patient herself is not really coherent enough to consent to this.  I talked at length to the two nieces and family members who consented to cardioversion if needed.  PHYSICAL EXAMINATION  VITAL SIGNS:  Blood pressure 80/palp.  She is in atrial fibrillation at a rate of 90.  LUNGS:  Clear.  NECK:  Carotids normal.  HEART:  There is an S1, S2 with systolic ejection murmur.  ABDOMEN:  Benign.  EXTREMITIES:  She is status post right hip arthroplasty.  LABORATORIES:  EKG shows atrial fibrillation with no acute changes.  IMPRESSION:  After being on the Cardizem drip and Neo-Synephrine, the patient appears to have converted to sinus rhythm on her own.  If this is the case we will not have to mechanically cardiovert her.  If she has converted and her blood  pressure is near 100 we will stop her Cardizem and start her on intravenous amiodarone for 48 hours to make sure she does not flip back into atrial fibrillation recurrently.  Dr. Sherene Sires will manage her other issues including weaning her Neo-Synephrine.  I do not think there is any need for further cardiac work-up at this time. She is on iron and Epogen.  In regards to getting her hemoglobin back up, further care will be provided by Dr. Sherene Sires and Dr. August Saucer. Dictated by:   Noralyn Pick Eden Emms, M.D. [facility] Attending Physician:  Burnard Bunting DD:  04/15/02 TD:  04/17/02 Job: [id number]

## 2011-03-26 NOTE — Consult Note (Signed)
NAME:  Lindsay Shannon, Lindsay Shannon                          ACCOUNT NO.:  [id number]   MEDICAL RECORD NO.:  [id number]                   PATIENT TYPE:  INP   LOCATION:  5725                                 FACILITY:  [facility]   PHYSICIAN:  Abigail Miyamoto, M.D.              DATE OF BIRTH:  07/19/1923   DATE OF CONSULTATION:  01/31/2004  DATE OF DISCHARGE:                                   CONSULTATION   REFERRING PHYSICIAN:  Rosalyn Gess. Norins, M.D.   CHIEF COMPLAINT:  1. Anemia.  2. Gastrointestinal bleed.  3. Leiomyoma of the stomach.   HISTORY:  Lindsay Shannon is a pleasant, 77 year old, African American female,  who is also a TEFL teacher Witness, who presented with some possible  hematochezia for approximately 10-14 days prior to admission.  She had been  seen in the office on January 27, 2004, and at that time had a hemoglobin of  8.4 and heme-positive stools.  She has since been seen by Iva Boop,  M.D., for GI evaluation and had an upper endoscopy on January 30, 2004,  showing her to have a possible leiomyoma with some central ulceration which  was not actively bleeding, but certainly would explain her anemia.  She had  had a negative colonoscopy in March of 2004.  The patient was admitted with  weakness.  She currently reports that she is feeling moderately well.  She  has had no syncopal episodes and no chest pain, fever, or shortness of  breath.  She has had no dysuria and has otherwise been moving her bowels  well.   PAST MEDICAL HISTORY:  Significant for:  1. Hypertension.  2. Some renal insufficiency.  3. Osteoarthritis as well.   PAST SURGICAL HISTORY:  1. Appendectomy.  2. Hysterectomy.  3. Right hip replacement.   MEDICATIONS:  Hydrochlorothiazide.   ALLERGIES:  None.   SOCIAL HISTORY:  She does not smoke and she does not drink alcohol.  Again,  she is a Scientist, product/process development.   REVIEW OF SYSTEMS:  Otherwise negative for a cardiopulmonary standpoint or  as above.   PHYSICAL EXAMINATION:  GENERAL APPEARANCE:  A thin female in no acute  distress.  She is well appearing.  VITAL SIGNS:  The temperature is 98.8 degrees, the respiratory rate is 16,  the pulse is 81, and the blood pressure is 115/65.  HEENT:  Eyes:  She is anicteric.  Pupils are reactive bilaterally.  Ears,  Nose, Mouth, and Throat:  The external ears and nose are normal.  Hearing is  normal.  The oropharynx is clear.  NECK:  Supple.  There is no cervical adenopathy.  There is no thyromegaly.  LUNGS:  Clear to auscultation bilaterally.  Normal respiratory effort.  CARDIOVASCULAR:  Regular rate and rhythm with no murmurs.  There is  peripheral edema.  ABDOMEN:  Soft.  There are well-healed incisions with no hernias.  There are  no  masses.  There is no organomegaly.  EXTREMITIES:  Warm and well perfused with no cyanosis, clubbing, or edema.   X-RAY DATA:  The patient has a CT scan of the abdomen and pelvis which shows  her to have approximately a 3 cm mass in the anterior wall of the stomach in  the antral area.  She also has polycystic kidneys.  There is no adenopathy  and no evidence of metastatic disease.   LABORATORY DATA:  Current laboratory data shows her to have a hemoglobin of  7.9.  Creatinine 2.3.  Potassium 2.8.   IMPRESSION AND PLAN:  This is a patient with anemia most likely from a  leiomyoma of the stomach.  At this point, this remains a difficult  situation.  Her hemoglobin has drifted down and she refuses blood products.  I explained to her the need to remove this mass surgically, but the great  risk it plays upon her with postoperative bleeding and the cardiopulmonary  issues she may run into as her hemoglobin drifts down further.  She  understands the potential risk of death should she not receive a  transfusion.  At this point, I have recommended anything to help boost her  hemoglobin and hematocrit like iron, erythropoietin, etc.  We will plan for  exploratory  laparotomy and resection of this tumor next week should she  agree to this, but we will do it this weekend should she acutely bleed.                                               Abigail Miyamoto, M.D.    DB/MEDQ  D:  01/31/2004  T:  02/01/2004  Job:  [id number]

## 2011-03-26 NOTE — Discharge Summary (Signed)
NAME:  Lindsay Shannon, Lindsay Shannon                          ACCOUNT NO.:  [id number]   MEDICAL RECORD NO.:  [id number]                   PATIENT TYPE:  INP   LOCATION:  5736                                 FACILITY:  [facility]   PHYSICIAN:  Rene Paci, M.D. [facility]          DATE OF BIRTH:  1923-07-16   DATE OF ADMISSION:  01/30/2004  DATE OF DISCHARGE:                                 DISCHARGE SUMMARY   DISCHARGE DIAGNOSES:  1. Upper gastrointestinal bleed.  2. Gastritis.  3. Central ulceration.  4. Multifactorial anemia secondary to chronic renal insufficiency/iron     deficiency/upper gastrointestinal bleed.  5. Benign gastrointestinal tumor.   BRIEF ADMISSION HISTORY:  Lindsay Shannon is a 77 year old white female who  underwent endoscopy on January 30, 2004. This revealed neoplasia that appeared  to be benign in the stomach with gastritis. She also had evidence of a  leiomyoma with central ulceration, not currently actively bleeding but  thought to be the source of her anemia and probable upper GI bleed. The  patient was admitted for further radiological imaging and a surgery consult.   PAST MEDICAL HISTORY:  1. Hypertension.  2. Chronic renal insufficiency.  3. Osteoarthritis.  4. Status post appendectomy.  5. Hysterectomy.  6. Right hip replacement.   HOSPITAL COURSE:  1. Gastrointestinal. The patient presented with a probable leiomyoma with     central ulceration. She was admitted for CT of her abdomen and pelvis.     This was unremarkable. She also had a surgical consult, and it was     recommended that she have a resection. On February 03, 2004, the patient     underwent exploratory laparotomy with resection of a gastric tumor.     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 The patient has slowly improved and has recovered from her     surgery. She is currently tolerating a regular diet and felt to be stable     for discharge.  2. Anemia. The patient  had significant anemia. This is felt to be     multifactorial secondary to chronic renal insufficiency and iron     deficiency with a recent GI bleed. The patient is a Jehovah's Witness and     unable to receive blood products. Therefore, transfusion has not been an     option. The patient has been treated with Epogen and IV Venofer. The     patient was seen in consultation by hematology who agreed with her     diagnosis and made recommendations to continue Epogen every week and to     change her to oral Niferex. He did feel that multiple myeloma needed to     be ruled out. We have obtained a 24-hour urine protein for     electrophoresis which is still pending. Skeletal bone scan is also     pending. The patient will need  outpatient followup with Dr. Darnelle Catalan for     possible bone marrow aspiration and biopsy. She is hemodynamically stable     with a hemoglobin of 7.8.   DISCHARGE LABORATORY DATA:  Hemoglobin 10.8, hematocrit 23.3. BUN 12,  creatinine 1.8. AST 42, otherwise LFTs were normal. Twenty-four urine  protein electrophoresis pending. Blood cultures were negative. ANA negative.   MEDICATIONS AT DISCHARGE:  1. Epogen 20,000 units subcu every week. We will arrange for this at day     hospital.  2. Niferex 150 mg q.d.  3. Protonix 40 mg q.d.  4. Hydrochlorothiazide 25 mg q.h.s.   FOLLOW UP:  With Dr. Magnus Ivan as instructed. We will encourage the patient  to follow up with Dr. Darnelle Catalan in one to two weeks and follow up with Dr.  Debby Bud in about six weeks.      Cornell Barman, P.A. [facility]                  Rene Paci, M.D. [facility]    LC/MEDQ  D:  02/10/2004  T:  02/11/2004  Job:  [id number]   cc:   Abigail Miyamoto, M.D.  [address]  [place]  [place] [address]  Fax: [phone]   Valentino Hue. Magrinat, M.D.  [address] [facility]  [place]  [place] [address]  Fax: [phone]   Rosalyn Gess. Norins, M.D. [facility]

## 2011-03-26 NOTE — H&P (Signed)
NAME:  Lindsay Shannon, Lindsay Shannon                          ACCOUNT NO.:  [id number]   MEDICAL RECORD NO.:  [id number]                   PATIENT TYPE:  INP   LOCATION:  5725                                 FACILITY:  [facility]   PHYSICIAN:  Rosalyn Gess. Norins, M.D. [facility]         DATE OF BIRTH:  06-11-1923   DATE OF ADMISSION:  01/30/2004  DATE OF DISCHARGE:                                HISTORY & PHYSICAL   CHIEF COMPLAINT:  Upper GI bleed.   HISTORY OF PRESENT ILLNESS:  Lindsay Shannon is a 77 year old widowed black  female who has a history of weakness, a history of an episodes of currant  jelly-type hematochezia approximately 10 days prior to admission.  She was  seen in the office January 27, 2004 and at that time was hemodynamically  stable and did have heme positive stool.  Laboratory was drawn and revealed  a hemoglobin of 8.4 gm as well as creatinine of 2.0 up from her baseline of  1.4.   The patient was referred urgently to Dr. Stan Head in the GI department  who saw the patient.  She was taken on the day of admission to the  [facility] for EGD which revealed a probable  leiomyoma of the stomach lining with ulceration as the source of her blood  loss.  Repeat laboratory revealed that her hemoglobin had dropped to 8.0 gm.  The patient is now admitted for IV hydration, IV iron, further diagnostic  testing and surgical consult.   PAST MEDICAL HISTORY:  SURGICAL:  Appendectomy in 1944.  Hysterectomy in the  past.  Right hip replacement in the past.  MEDICAL ILLNESSES:  The patient had the usual childhood diseases.  She is a  gravida 0, para 0.  She has a history of osteoarthritis, history of  hypertension.  The patient did have heme positive stool and underwent  colonoscopy in March of 2004 which was unremarkable except for internal and  external hemorrhoids.   CURRENT MEDICATIONS:  1. Hydrochlorothiazide.  2. Calcium.  3. Oral iron.   HABITS:  Tobacco none.   Alcohol none.   FAMILY HISTORY:  Mother died in her 18's from tuberculosis.  Father died in  his 70's of old age, cause of death not known.  She had a brother who died  at age 63 of throat cancer.   SOCIAL HISTORY:  The patient was widowed in 50.  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  She has no children.  She does  live alone, she is independent in her activities of daily living.  She does  have a very supportive family network.  The patient is a TEFL teacher Witness  and by the precepts of her belief will not accept transfusion.   REVIEW OF SYSTEMS:  The patient has had weakness and light headedness per  the history of present illness but no other  systemic or constitutional  complaints.   PHYSICAL EXAM AT ADMISSION AND PERFORMED IN THE OFFICE:  VITAL SIGNS:  Blood  pressure 138/60, respirations were 16, heart rate 75, weight 129.  GENERAL:  This is a slender, well-nourished elderly black female, awake,  alert and cooperative.  HEENT:  Exam normocephalic, atraumatic.  The patient is edentulous with  loose fitting dentures.  Posterior pharynx was clear.  Conjunctivae and  sclerae were clear.  Pupils are equal, round and reactive.  NECK:  Supple, there was no thyromegaly, nodes, no adenopathy was noted in  the cervical or supraclavicular regions.  CHEST:  No CVA tenderness.  LUNGS:  Clear to auscultation and percussion with no rales, wheezes or  rhonchi.  She had no CVA tenderness.  BREAST EXAM:  Breasts were pendulous, skin was normal, nipples without  discharge. There were no fixed masses or lesions.  There is no axillary  adenopathy.  CARDIOVASCULAR:  She had 2+ radial pulse, no JVD or carotid bruits.  She had  a quiet precordium, a regular rate and rhythm without murmurs, rubs, or  gallops.  ABDOMEN:  The patient had positive bowel sounds in all 4 quadrants, no  organomegaly or splenomegaly, no guarding, no rebound.  RECTAL EXAM:  Deferred to exam on  the 21st which had revealed normal  sphincter tone, no masses in the rectal vault. Heme positive stool.  PELVIC EXAM:  Deferred.  EXTREMITIES:  The patient has a valgus deformity of her right knee,  otherwise unremarkable.  NEUROLOGIC EXAM:  Revealed the patient to be awake, alert and oriented to  person, place, time and context.  It was a nonfocal examination.   DATA BASE:  The patient did have laboratory performed at [facility] on January 29, 2004 revealing a hemoglobin of 8 gm, white count was 7100, MCV was  normal at 93.6.  The patient had a total iron of 37, transferrin was low at  182.9, iron saturation was low at 14.4%.  Ferritin was normal at 45, folate  was high at 12.4, B12 was normal at 7.91.   Upper endoscopy performed at the [facility], reported test revealed a mass in the  stomach in the prepyloric area.  Biopsy was taken for H. pylori and was  negative.  It did appear that one of these lesions had erosions suggestive  of a possible source of early bleeding.  Dr. Marvell Fuller impression was  neoplasia, probably benign and nonspecific gastritis.  Recommendation was  for proton pump inhibitors b.i.d.   ASSESSMENT AND PLAN:  1. Anemia.  A patient with a history of recent acute blood loss.  She has     had a colonoscopy recently which was unremarkable.  Upper GI as noted.     Suspect that as the source of her anemia and blood loss.  The patient's     hemoglobin at baseline was 12.2 gm.  PLAN:  The patient will be given iron by IV infusion to stimulating  reticulation and improve her hemoglobin.  If this is unsuccessful in getting  a good response would need to talk with her about the use of Epogen which is  an albumin product.   1. Gastrointestinal.  A patient with upper GI bleed from erosion of     leiomyoma.  PLAN:  CT scan of the abdomen and pelvis to look for adenopathy, thickening of the gastric wall or other indications of disease.  We will request a  surgical consult and I  have  spoken with Dr. Avel Peace.  The patient  will probably be seen on January 31, 2004.   1. Renal insufficiency.  A patient with significant bump in her creatinine     from a baseline of 1.2 to 2.0.  Question whether this represents prerenal     azotemia or an indigenous renal disease.  Question of whether she has     some underlying anemia secondary to erythropoietin deficiency.  PLAN:  A 24 hour urine for creatinine clearance and total protein.  Serum  protein electrophoresis, urine protein electrophoresis, ANA, imaging of her  kidneys by CT as noted.   IN SUMMARY:  This is a very pleasant 77 year old woman with problems  outlined above.  I have explained the plan of action to the patient in  detail and she is willing to proceed although would prefer as brief a  hospital stay as possible.                                                Rosalyn Gess Norins, M.D. [facility]    MEN/MEDQ  D:  01/30/2004  T:  02/01/2004  Job:  [id number]   cc:   Iva Boop, M.D. [facility]

## 2011-03-26 NOTE — Discharge Summary (Signed)
   NAME:  Lindsay Shannon, Lindsay Shannon NO.:  [id number]   MEDICAL RECORD NO.:  [id number]                   PATIENT TYPE:   LOCATION:                                       FACILITY:   PHYSICIAN:  Burnard Bunting, M.D.                 DATE OF BIRTH:   DATE OF ADMISSION:  04/12/2002  DATE OF DISCHARGE:  04/27/2002                                 DISCHARGE SUMMARY   DISCHARGE DIAGNOSIS:  Right hip arthritis.   SECONDARY DIAGNOSIS:  Hypertension.   OPERATIONS/PROCEDURES:  Right total hip arthroplasty performed April 12, 2002.   See dictated History and Physical for history of present illness.   HOSPITAL COURSE:  The patient is a 77 year old Jehovah's Witness who  underwent total hip arthroplasty on April 12, 2002.  The patient tolerated the  procedure well but had acute blood loss anemia and was transferred to the  unit postoperatively.  At that time EPO protocol was initiated.  The patient  did not require pressors and maintained her mentation during her admission  to the unit.  Critical care consultation was obtained.  Mechanical DVT  prophylaxis was initiated.  The patient was maintained on EPO protocol.  She  did develop atrial fibrillation on April 16, 2002 which did not require  cardioversion.  She did convert on her own.  She maintained her oxygen  saturation and came off of pressors on April 17, 2002.  Her hemoglobin and  hematocrit began to slowly rise.  She was transferred to the orthopedic  floor on April 20, 2002.  Hemoglobin was 5.3 at that time.  She was placed on  Lovenox for DVT prophylaxis.  The patient was started with physical therapy  on April 23, 2002 and actually made good progress in mobilization.  Ultrasound was negative for DVT in the lower extremities.  Her incision was  intact on postoperative day #14.  Staples were removed April 27, 2002.  She  was transferred to the rehab service at that time.   DISCHARGE MEDICATIONS:  Iron, Percocet for pain, as  well as Coumadin for DVT  prophylaxis.   FOLLOW-UP:  She will follow up with me in about a month for repeat x-rays.                                               Burnard Bunting, M.D.    GSD/MEDQ  D:  06/27/2002  T:  06/29/2002  Job:  [id number]

## 2011-08-09 LAB — OCCULT BLOOD X 1 CARD TO LAB, STOOL
Fecal Occult Bld: POSITIVE
Fecal Occult Bld: POSITIVE

## 2011-08-09 LAB — VITAMIN B12: Vitamin B-12: 358 (ref 211–911)

## 2011-08-09 LAB — CBC
HCT: 23.5 — ABNORMAL LOW
Hemoglobin: 10.1 — ABNORMAL LOW
Hemoglobin: 8 — ABNORMAL LOW
Hemoglobin: 8.3 — ABNORMAL LOW
MCHC: 33
MCHC: 33.4
MCHC: 34.1
MCV: 94.2
Platelets: 155
Platelets: 169
Platelets: 178
RBC: 2.52 — ABNORMAL LOW
RBC: 2.63 — ABNORMAL LOW
RBC: 3.22 — ABNORMAL LOW
RDW: 13.5
RDW: 13.9
WBC: 6.5
WBC: 7.9

## 2011-08-09 LAB — POCT I-STAT, CHEM 8
BUN: 32 — ABNORMAL HIGH
Calcium, Ion: 1.03 — ABNORMAL LOW
Chloride: 113 — ABNORMAL HIGH
Creatinine, Ser: 2.4 — ABNORMAL HIGH
Glucose, Bld: 137 — ABNORMAL HIGH
Potassium: 3.3 — ABNORMAL LOW

## 2011-08-09 LAB — DIFFERENTIAL
Basophils Relative: 0
Eosinophils Absolute: 0.1
Eosinophils Relative: 1
Lymphs Abs: 1.8
Monocytes Relative: 8

## 2011-08-09 LAB — COMPREHENSIVE METABOLIC PANEL
ALT: 15
AST: 26
Albumin: 2.9 — ABNORMAL LOW
Alkaline Phosphatase: 80
Alkaline Phosphatase: 92
BUN: 28 — ABNORMAL HIGH
CO2: 22
CO2: 22
Calcium: 8.5
Chloride: 119 — ABNORMAL HIGH
GFR calc Af Amer: 24 — ABNORMAL LOW
GFR calc non Af Amer: 19 — ABNORMAL LOW
GFR calc non Af Amer: 22 — ABNORMAL LOW
Glucose, Bld: 103 — ABNORMAL HIGH
Glucose, Bld: 148 — ABNORMAL HIGH
Potassium: 3.3 — ABNORMAL LOW
Potassium: 3.9
Sodium: 146 — ABNORMAL HIGH
Total Bilirubin: 0.5
Total Protein: 6.1

## 2011-08-09 LAB — BASIC METABOLIC PANEL
BUN: 17
CO2: 21
Calcium: 8.5
Chloride: 119 — ABNORMAL HIGH
Creatinine, Ser: 2.03 — ABNORMAL HIGH
GFR calc Af Amer: 26 — ABNORMAL LOW
GFR calc non Af Amer: 21 — ABNORMAL LOW
Sodium: 142

## 2011-08-09 LAB — FOLATE: Folate: 5.5

## 2011-08-09 LAB — IRON AND TIBC: TIBC: 177 — ABNORMAL LOW

## 2011-08-09 LAB — RETICULOCYTES: Retic Count, Absolute: 34.3

## 2011-08-09 LAB — HEMOGLOBIN AND HEMATOCRIT, BLOOD: Hemoglobin: 8.9 — ABNORMAL LOW

## 2011-08-09 LAB — PROTIME-INR: Prothrombin Time: 14.6

## 2011-10-30 ENCOUNTER — Emergency Department (HOSPITAL_COMMUNITY): Payer: Medicare Other

## 2011-10-30 ENCOUNTER — Encounter: Payer: Self-pay | Admitting: *Deleted

## 2011-10-30 ENCOUNTER — Emergency Department (HOSPITAL_COMMUNITY)
Admission: EM | Admit: 2011-10-30 | Discharge: 2011-10-30 | Disposition: A | Discharge status: Home / Self Care | Payer: Medicare Other | Attending: Emergency Medicine | Admitting: Emergency Medicine

## 2011-10-30 DIAGNOSIS — M25461 Effusion, right knee: Secondary | ICD-10-CM

## 2011-10-30 DIAGNOSIS — R110 Nausea: Secondary | ICD-10-CM | POA: Insufficient documentation

## 2011-10-30 DIAGNOSIS — M25469 Effusion, unspecified knee: Secondary | ICD-10-CM | POA: Insufficient documentation

## 2011-10-30 DIAGNOSIS — M25569 Pain in unspecified knee: Secondary | ICD-10-CM | POA: Insufficient documentation

## 2011-10-30 DIAGNOSIS — I10 Essential (primary) hypertension: Secondary | ICD-10-CM | POA: Insufficient documentation

## 2011-10-30 DIAGNOSIS — Z79899 Other long term (current) drug therapy: Secondary | ICD-10-CM | POA: Insufficient documentation

## 2011-10-30 HISTORY — DX: Essential (primary) hypertension: I10

## 2011-10-30 MED ORDER — HYDROCODONE-ACETAMINOPHEN 5-325 MG PO TABS
1.0000 | ORAL_TABLET | Freq: Once | ORAL | Status: AC
  Administered 2011-10-30: 1 via ORAL
  Filled 2011-10-30: qty 1

## 2011-10-30 MED ORDER — LIDOCAINE HCL (PF) 1 % IJ SOLN: 5.0000 mL | Freq: Once | INTRAMUSCULAR | Status: DC

## 2011-10-30 MED ORDER — HYDROCODONE-ACETAMINOPHEN 5-500 MG PO TABS: 1.0000 | ORAL_TABLET | Freq: Four times a day (QID) | ORAL | Status: AC | PRN

## 2011-10-30 NOTE — ED Notes (Signed)
Discharge instructions reviewed with pt and friend at bedside.  Pt verbalizes understanding.  No questions asked; no further c/o's voiced.  Pt to lobby via wheelchair escorted by tech.  NAD noted;  VSS.

## 2011-10-30 NOTE — ED Provider Notes (Signed)
History     CSN: [id number]  Arrival date & time 10/30/11  1533   First MD Initiated Contact with Patient 10/30/11 1733      Chief Complaint  Patient presents with  . Leg Pain    (Consider location/radiation/quality/duration/timing/severity/associated sxs/prior treatment) HPI Comments: Hx of R knee replacement 2-3 years ago. Patient w/ R knee pain for a few days, increased swelling today. Unable to bear weight after attempting to walk this morning. No hx of trauma. After initial surgery had multiple arthrocenteses to remove fluid.  Patient is a 77 y.o. female presenting with leg pain. The history is provided by the patient. No language interpreter was used.  Leg Pain  The incident occurred 2 days ago. The incident occurred at home. There was no injury mechanism. The pain is present in the right knee. The quality of the pain is described as aching. The pain is at a severity of 5/10. The pain is moderate. The pain has been constant since onset. Associated symptoms include inability to bear weight (walked to kitchen this morning but experienced severe pain). Pertinent negatives include no numbness, no loss of motion, no muscle weakness, no loss of sensation and no tingling. She reports no foreign bodies present. The symptoms are aggravated by activity and bearing weight. She has tried nothing for the symptoms. The treatment provided no relief.    Past Medical History  Diagnosis Date  . Hypertension     Past Surgical History  Procedure Date  . Partial hip arthroplasty     No family history on file.  History  Substance Use Topics  . Smoking status: Never Smoker   . Smokeless tobacco: Not on file  . Alcohol Use: No    OB History    Grav Para Term Preterm Abortions TAB SAB Ect Mult Living                  Review of Systems  Constitutional: Negative for fever and chills.  Respiratory: Negative for cough and shortness of breath.   Cardiovascular: Negative for chest pain.    Gastrointestinal: Positive for nausea. Negative for vomiting and diarrhea.  Neurological: Negative for tingling and numbness.  All other systems reviewed and are negative.    Allergies  Review of patient's allergies indicates no known allergies.  Home Medications   Current Outpatient Rx  Name Route Sig Dispense Refill  . AMLODIPINE BESYLATE 5 MG PO TABS Oral Take 5 mg by mouth daily.      Marland Kitchen OVER THE COUNTER MEDICATION Oral Take 2 tablets by mouth daily. Natural swiss kriss laxative tablets     . SENNOSIDES 25 MG PO TABS Oral Take 2 tablets by mouth 2 (two) times daily.        BP 159/93  Pulse 75  Temp(Src) 99.5 F (37.5 C) (Oral)  Resp 16  SpO2 99%  Physical Exam  Nursing note and vitals reviewed. Constitutional: She is oriented to person, place, and time. She appears well-developed and well-nourished. No distress.  HENT:  Head: Normocephalic and atraumatic.  Eyes: EOM are normal. Pupils are equal, round, and reactive to light.  Neck: Normal range of motion. Neck supple.  Cardiovascular: Normal rate and regular rhythm.  Exam reveals no friction rub.   No murmur heard. Pulmonary/Chest: Effort normal and breath sounds normal. No respiratory distress. She has no wheezes. She has no rales.  Abdominal: Soft. She exhibits no distension. There is no tenderness. There is no rebound.  Musculoskeletal: She exhibits no  edema.       Right knee: She exhibits decreased range of motion (Decreased active ROM. Able to passively flex and extend knee) and effusion (Moderate). She exhibits no deformity, no laceration, no erythema and normal alignment. tenderness (entire joint) found.  Neurological: She is alert and oriented to person, place, and time.  Skin: She is not diaphoretic.    ED Course  ARTHOCENTESIS Date/Time: 10/30/2011 10:00 PM Performed by: Elwin Mocha Authorized by: Geoffery Lyons Consent: Verbal consent obtained. Risks and benefits: risks, benefits and alternatives were  discussed Consent given by: patient Patient understanding: patient states understanding of the procedure being performed Indications: joint swelling  Body area: knee Joint: right knee Local anesthesia used: yes Anesthesia: local infiltration Local anesthetic: lidocaine 1% without epinephrine Anesthetic total: 10 ml Patient sedated: no Preparation: Patient was prepped and draped in the usual sterile fashion. Needle gauge: 18 & 21 gauge needle used. Approach: lateral Aspirate amount: 0 ml Comments: Unable to enter joint space   (including critical care time)  Labs Reviewed - No data to display No results found.   No diagnosis found.  DG Knee 2 Views Right (Final result)   Result time:10/30/11 1935    Final result by Rad Results In Interface (10/30/11 19:35:14)    Narrative:   *RADIOLOGY REPORT*  Clinical Data: Pain and swelling at right knee, effusion, prior right knee joint replacement  RIGHT KNEE - 1-2 VIEW  Comparison: 02/10/2004  Findings: Diffuse osseous demineralization. Components of right knee prosthesis in expected positions, new since previous exam. Knee joint effusion present. No periprosthetic lucency. No acute fracture, dislocation or bone destruction. Scattered atherosclerotic calcification.  IMPRESSION: Right knee prosthesis. Osseous demineralization. Knee joint effusion.  Original Report Authenticated By: Lollie Marrow, M.D.      MDM  [phone] p/w knee pain. Began 2 days ago. Increased swelling and inability to ambulate earlier today. No hx of trauma. Moderate-sized R knee effusion with increased warmth compared to contralateral side.  Knee tender. No erythema or induration. Decreased active ROM, however full ROM  Xray obtained showing effusion, no fractures. Arthrocentesis performed, unable to enter joint space. Arthritic bone encountered. With improvement with Vicodin and no physical findings concerning for infection, patient given Vicodin and  instructed to f/u with Orthopedics. Discharged home in stable condition.     Elwin Mocha, MD 10/31/11 [phone]

## 2011-10-30 NOTE — ED Notes (Signed)
Patient had right knee replacement years ago and her right knee has not been the same.  Right hip replacement a week ago and now her right leg hurts.  Right knee is swollen and very painful

## 2011-11-01 NOTE — ED Provider Notes (Signed)
I saw and evaluated the patient, reviewed the resident's note and I agree with the findings and plan.  The patient arrives complaining of pain and swelling of the right knee.  She had surgery on the same in the past.  On exam, the knee has an effusion , but is not hot or red.  Dr. Gwendolyn Grant made an attempt at aspiration of the fluid, however this was unable to be obtained.  Will discharge to home with pain meds and follow up with ortho.  To return prn for fevers or increasing pain.  Geoffery Lyons, MD 11/01/11 [phone]

## 2012-08-29 ENCOUNTER — Telehealth: Payer: Self-pay | Admitting: Oncology

## 2012-08-29 NOTE — Telephone Encounter (Signed)
C/D 08/29/12 for appt 09/06/12

## 2012-08-29 NOTE — Telephone Encounter (Signed)
S/W PT IN REF TO NP APPT. ON 09/06/12 @ 10:30 REFERRING DR. Arman Bogus MAILED NP PACKET

## 2012-09-01 ENCOUNTER — Other Ambulatory Visit: Payer: Self-pay | Admitting: Oncology

## 2012-09-01 DIAGNOSIS — D729 Disorder of white blood cells, unspecified: Secondary | ICD-10-CM

## 2012-09-06 ENCOUNTER — Ambulatory Visit: Payer: Medicare Other

## 2012-09-06 ENCOUNTER — Ambulatory Visit (HOSPITAL_COMMUNITY)
Admission: RE | Admit: 2012-09-06 | Discharge: 2012-09-06 | Disposition: A | Discharge status: Home / Self Care | Payer: Medicare Other | Source: Ambulatory Visit | Attending: Oncology | Admitting: Oncology

## 2012-09-06 ENCOUNTER — Telehealth: Payer: Self-pay | Admitting: Oncology

## 2012-09-06 ENCOUNTER — Ambulatory Visit (HOSPITAL_BASED_OUTPATIENT_CLINIC_OR_DEPARTMENT_OTHER): Payer: Medicare Other | Admitting: Oncology

## 2012-09-06 ENCOUNTER — Other Ambulatory Visit (HOSPITAL_BASED_OUTPATIENT_CLINIC_OR_DEPARTMENT_OTHER): Payer: Medicare Other | Admitting: Lab

## 2012-09-06 ENCOUNTER — Encounter: Payer: Self-pay | Admitting: Oncology

## 2012-09-06 VITALS — BP 158/85 | HR 55 | Temp 97.0°F | Resp 20 | Ht 62.0 in | Wt 108.3 lb

## 2012-09-06 DIAGNOSIS — D649 Anemia, unspecified: Secondary | ICD-10-CM

## 2012-09-06 DIAGNOSIS — R894 Abnormal immunological findings in specimens from other organs, systems and tissues: Secondary | ICD-10-CM

## 2012-09-06 DIAGNOSIS — D729 Disorder of white blood cells, unspecified: Secondary | ICD-10-CM

## 2012-09-06 DIAGNOSIS — D892 Hypergammaglobulinemia, unspecified: Secondary | ICD-10-CM

## 2012-09-06 DIAGNOSIS — M899 Disorder of bone, unspecified: Secondary | ICD-10-CM | POA: Insufficient documentation

## 2012-09-06 HISTORY — DX: Hypergammaglobulinemia, unspecified: D89.2

## 2012-09-06 LAB — COMPREHENSIVE METABOLIC PANEL (CC13)
ALT: <6 U/L (ref 0–55)
Albumin: 3.1 g/dL — ABNORMAL LOW (ref 3.5–5.0)
CO2: 22 mEq/L (ref 22–29)
Calcium: 8.8 mg/dL (ref 8.4–10.4)
Chloride: 114 mEq/L — ABNORMAL HIGH (ref 98–107)
Glucose: 90 mg/dl (ref 70–99)
Potassium: 4.2 mEq/L (ref 3.5–5.1)
Sodium: 144 mEq/L (ref 136–145)
Total Bilirubin: 0.24 mg/dL (ref 0.20–1.20)
Total Protein: 7.5 g/dL (ref 6.4–8.3)

## 2012-09-06 LAB — CBC WITH DIFFERENTIAL/PLATELET
BASO%: 0.2 % (ref 0.0–2.0)
Eosinophils Absolute: 0.1 10*3/uL (ref 0.0–0.5)
LYMPH%: 22.6 % (ref 14.0–49.7)
MONO#: 0.6 10*3/uL (ref 0.1–0.9)
NEUT#: 4.4 10*3/uL (ref 1.5–6.5)
Platelets: 246 10*3/uL (ref 145–400)
RBC: 3.05 10*6/uL — ABNORMAL LOW (ref 3.70–5.45)
WBC: 6.6 10*3/uL (ref 3.9–10.3)
lymph#: 1.5 10*3/uL (ref 0.9–3.3)

## 2012-09-06 NOTE — Telephone Encounter (Signed)
gv and printed appt schedule for pt for NOV °

## 2012-09-06 NOTE — Progress Notes (Signed)
Note dictated

## 2012-09-06 NOTE — Progress Notes (Signed)
Checked in new patient. No financial issues. Patient has misplaced her card. We gave her ph# to call and request a new card. She has no drivers license. She does not drive.

## 2012-09-07 NOTE — Progress Notes (Signed)
CC:   Lindsay Shannon, M.D. Lindsay Showers, MD  REASON FOR CONSULTATION:  Evaluation for plasma cell disorder.  HISTORY OF PRESENT ILLNESS:  This is an 78 year old African American woman currently of [place], lived the majority of her life around this area.  She lives alone and has had multiple jobs in the past, but currently retired.  She has a past medical history significant for hypertension and long-standing renal insufficiency.  Her creatinine dating back to 2009 around 2.1; however, most recently was up to 2.67 and a GFR of about 20 cc/minute.  She was referred by Dr. Clent Ridges to Dr. Lowell Shannon at the [facility] for an evaluation.  The patient had a repeat workup at that time including a serum protein electrophoresis which showed an M spike about 1.2 g/dL.  Quantitative immunoglobulin showed a biclonal elevation in her quantitative immunoglobulin.  Her IgG level was elevated at 2406.  Her IgA level was 869.  The immunofixation did confirm the presence of IgG and IgA monoclonal kappa subtype.  Iron levels showed iron binding capacity to be low at 177, iron level was low at 34, vitamin B12 was normal, ferritin was 117, which was normal.  Based on these findings, the patient was referred to me for evaluation for plasma cell disorder and her chronic renal insufficiency.  Clinically, she reports some weakness, some fatigue, some tiredness, but is not reporting any back pain.  Does not report any shoulder pain.  She does report some occasional knee pain.  She also has lost some weight, but she is exhibiting a lot of signs of geriatric syndrome.  She has had a lot of forgetfulness, maybe early signs of dementia.  REVIEW OF SYSTEMS:  She does not report any headaches, blurry vision, double vision.  Does not report any motor or sensory neuropathy.  Does not report any alteration in mental status.  Does not report any psychiatric issues or depression.  Does not report any  fever, chills, sweats.  Does not report any cough, hemoptysis, hematemesis.  No nausea or vomiting, abdominal pain, hematochezia, melena, genitourinary complaints.  Rest of review of systems is unremarkable.  PAST MEDICAL HISTORY:  Really unremarkable other than history of hypertension, status post knee replacement, hip replacement, status post appendectomy.  MEDICATION:  She is on amlodipine 10 mg daily.  She is on Tenormin 50 mg.  ALLERGIES:  None.  FAMILY HISTORY:  Mother had tuberculosis, but no history of any chronic diseases, no history of any malignancies.  She has a number of siblings, close to 9.  She is not really clear about their health.  There is history of some dementia from what she can tell.  SOCIAL HISTORY:  She is widowed.  She does not have any children. Denied any alcohol or tobacco abuse.  She currently lives alone, currently not driving.  She does have friends that check on her on a regular basis.  PHYSICAL EXAMINATION:  General:  Alert, awake woman, does not appear in any active distress today.  Vital Signs:  Blood pressure is 146/67, pulse 58, respirations 20, temperature is 97.  Weight is 108 pounds. ECOG performance status is 1.  HEENT:  Head is normocephalic, atraumatic.  Pupils equal, round, and reactive to light.  Oral mucosa moist and pink.  Neck:  Supple without adenopathy.  Heart:  Regular rate and rhythm.  S1 and S2.  Lungs:  Clear to auscultation.  No rhonchi, wheeze, dullness to percussion.  Abdomen:  Soft, nontender.  No hepatosplenomegaly.  Extremities:  No clubbing, cyanosis, or edema. Neurological:  Intact motor, sensory, and deep tendon reflexes.  LABORATORY DATA:  Today showed a hemoglobin of 9.1, white count of 6.6, platelet count of 246.  ASSESSMENT AND PLAN:  An 78 year old woman with the following issues:  1. A biclonal elevation of her protein electrophoresis with an M spike     of 1.2 g/dL.  She has an elevation of IgG and IgA  kappa light     chain.  The differential diagnosis was discussed today in detail     with Lindsay Shannon and her friend who accompanied her today.  I     explained to her that the biclonal elevation of her IgA and IgG     could be a sign of a plasma cell disorder such as monoclonal     gammopathy of undetermined significance, amyloidosis, multiple     myeloma.  It could also be a reactive process.  To work this up I     will obtain a skeletal survey.  Also I would like to obtain a 24-     hour urine collection to check for protein electrophoresis.  Based     on these findings, will determine the possible need for a bone     marrow biopsy.  She is not in the greatest of shapes, I do not     think she will be a great candidate for any aggressive treatment.     She is relatively asymptomatic.  I think a lot of her symptoms are     probably related to geriatric syndrome.  The mild dementia is     probably what is causing her weight loss.  However, I will make     that discussion with her after all these results.  Again, I favor     probably a monoclonal gammopathy of unknown significance at this     point. 2. Anemia.  Multifactorial in nature.  This could be an anemia of     chronic disease, anemia of renal disease, but also anemia due to a     plasma cell disorder.  We are investigating that.  All their questions were answered today.    ______________________________ Benjiman Core, M.D. FNS/MEDQ  D:  09/06/2012  T:  09/07/2012  Job:  [id number]

## 2012-09-08 LAB — SPEP & IFE WITH QIG
Albumin ELP: 45.2 % — ABNORMAL LOW (ref 55.8–66.1)
Alpha-1-Globulin: 5.4 % — ABNORMAL HIGH (ref 2.9–4.9)
Alpha-2-Globulin: 10 % (ref 7.1–11.8)
Beta 2: 10.4 % — ABNORMAL HIGH (ref 3.2–6.5)
Beta Globulin: 5.2 % (ref 4.7–7.2)
Gamma Globulin: 23.8 % — ABNORMAL HIGH (ref 11.1–18.8)
IgA: 856 mg/dL — ABNORMAL HIGH (ref 69–380)
IgG (Immunoglobin G), Serum: 2270 mg/dL — ABNORMAL HIGH (ref 690–1700)
IgM, Serum: 81 mg/dL (ref 52–322)
M-Spike, %: 0.68 g/dL
Total Protein, Serum Electrophoresis: 7.5 g/dL (ref 6.0–8.3)

## 2012-09-08 LAB — KAPPA/LAMBDA LIGHT CHAINS
Kappa free light chain: 5.51 mg/dL — ABNORMAL HIGH (ref 0.33–1.94)
Kappa:Lambda Ratio: 1.65 (ref 0.26–1.65)
Lambda Free Lght Chn: 3.34 mg/dL — ABNORMAL HIGH (ref 0.57–2.63)

## 2012-09-12 LAB — UIFE/LIGHT CHAINS/TP QN, 24-HR UR
Alpha 1, Urine: DETECTED — AB
Alpha 2, Urine: DETECTED — AB
Free Kappa Lt Chains,Ur: 10.1 mg/dL — ABNORMAL HIGH (ref 0.14–2.42)
Free Kappa/Lambda Ratio: 20.2 ratio — ABNORMAL HIGH (ref 2.04–10.37)
Free Lambda Excretion/Day: 7.25 mg/d
Free Lt Chn Excr Rate: 146.45 mg/d
Time: 24 hours
Total Protein, Urine-Ur/day: 162 mg/d — ABNORMAL HIGH (ref 10–140)
Total Protein, Urine: 11.2 mg/dL

## 2012-09-28 ENCOUNTER — Ambulatory Visit: Payer: Medicare Other | Admitting: Oncology

## 2012-10-02 ENCOUNTER — Other Ambulatory Visit: Payer: Self-pay | Admitting: Nephrology

## 2012-10-02 DIAGNOSIS — I10 Essential (primary) hypertension: Secondary | ICD-10-CM

## 2012-10-11 ENCOUNTER — Ambulatory Visit
Admission: RE | Admit: 2012-10-11 | Discharge: 2012-10-11 | Disposition: A | Discharge status: Home / Self Care | Payer: Medicare Other | Source: Ambulatory Visit | Attending: Nephrology | Admitting: Nephrology

## 2012-10-11 DIAGNOSIS — I10 Essential (primary) hypertension: Secondary | ICD-10-CM

## 2012-11-09 ENCOUNTER — Telehealth: Payer: Self-pay | Admitting: Oncology

## 2012-11-09 NOTE — Telephone Encounter (Signed)
Pt came into office and needed to r/s missed appt from Nov 2013...Marland KitchenMarland KitchenDone

## 2012-11-29 ENCOUNTER — Telehealth: Payer: Self-pay | Admitting: Oncology

## 2012-11-29 NOTE — Telephone Encounter (Signed)
s.w. pt and advised on r/s appt to 1.28.14...Marland KitchenMarland KitchenMarland Kitchenpt ok and aware

## 2012-12-08 ENCOUNTER — Ambulatory Visit: Payer: Medicare Other | Admitting: Oncology

## 2013-01-05 ENCOUNTER — Encounter: Payer: Self-pay | Admitting: *Deleted

## 2013-01-05 ENCOUNTER — Ambulatory Visit (HOSPITAL_BASED_OUTPATIENT_CLINIC_OR_DEPARTMENT_OTHER): Payer: Medicare Other | Admitting: Oncology

## 2013-01-05 ENCOUNTER — Telehealth: Payer: Self-pay | Admitting: Oncology

## 2013-01-05 DIAGNOSIS — R894 Abnormal immunological findings in specimens from other organs, systems and tissues: Secondary | ICD-10-CM

## 2013-01-05 NOTE — Telephone Encounter (Signed)
Gave pt appt for for August 2014 MD and lab

## 2013-01-05 NOTE — Progress Notes (Signed)
Hematology and Oncology Follow Up Visit  Lindsay Shannon [id number] [birth year] 79 y.o. 01/05/2013 3:45 PM   Principle Diagnosis: 79 year old with possible plasma cell disorder with M spike about 1.2 g/dL. Quantitative immunoglobulin showed a biclonal elevation in her quantitative immunoglobulin. Her IgG level was elevated at 2406. Her IgA level was  869  Current therapy: observation and follow up.   Interim History: Lindsay Shannon presents today for a follow up visit. She is a pleasant women with the above history and renal insufficiency. Her multiple myeloma work up is negative at this point. She has no bone lesions or her skeletal survey. She has an elevated Kappa and Lambda chains with a normal ration indicating less likely a monoclonal elevation. She continued to be asymptomatic at this point.   Medications: I have reviewed the patient's current medications. Current outpatient prescriptions:amLODipine (NORVASC) 5 MG tablet, Take 10 mg by mouth daily. , Disp: , Rfl: ;  atenolol (TENORMIN) 50 MG tablet, Take 50 mg by mouth daily., Disp: , Rfl:   Allergies: No Known Allergies  Past Medical History, Surgical history, Social history, and Family History were reviewed and updated.  Review of Systems: Constitutional:  Negative for fever, chills, night sweats, anorexia, weight loss, pain. Cardiovascular: no chest pain or dyspnea on exertion Respiratory: negative Neurological: negative Dermatological: negative ENT: negative Skin: Negative. Gastrointestinal: negative Genito-Urinary: negative Hematological and Lymphatic: negative Breast: negative Musculoskeletal: negative Remaining ROS negative. Physical Exam: There were no vitals taken for this visit. ECOG: 1 General appearance: alert Head: Normocephalic, without obvious abnormality, atraumatic Neck: no adenopathy, no carotid bruit, no JVD, supple, symmetrical, trachea midline and thyroid not enlarged, symmetric, no  tenderness/mass/nodules Lymph nodes: Cervical, supraclavicular, and axillary nodes normal. Heart:regular rate and rhythm, S1, S2 normal, no murmur, click, rub or gallop Lung:chest clear, no wheezing, rales, normal symmetric air entry Abdomin: soft, non-tender, without masses or organomegaly EXT:no erythema, induration, or nodules   Lab Results: Lab Results  Component Value Date   WBC 6.6 09/06/2012   HGB 9.1* 09/06/2012   HCT 27.5* 09/06/2012   MCV 90.3 09/06/2012   PLT 246 09/06/2012     Chemistry      Component Value Date/Time   NA 144 09/06/2012 1032   NA 142 08/03/2008 1030   K 4.2 09/06/2012 1032   K 4.1 08/03/2008 1030   CL 114* 09/06/2012 1032   CL 115* 08/03/2008 1030   CO2 22 09/06/2012 1032   CO2 20 08/03/2008 1030   BUN 26.0 09/06/2012 1032   BUN 13 08/03/2008 1030   CREATININE 2.6* 09/06/2012 1032   CREATININE 2.21* 08/03/2008 1030      Component Value Date/Time   CALCIUM 8.8 09/06/2012 1032   CALCIUM 8.5 08/03/2008 1030   ALKPHOS 96 09/06/2012 1032   ALKPHOS 80 07/31/2008 0340   AST 14 09/06/2012 1032   AST 21 07/31/2008 0340   ALT <6 09/06/2012 1032   ALT 15 07/31/2008 0340   BILITOT 0.24 09/06/2012 1032   BILITOT 0.5 07/31/2008 0340     Results for Lindsay Shannon, Lindsay Shannon (MRN [id number]) as of 01/05/2013 14:51  Ref. Range 09/06/2012 10:32  M-SPIKE, % No range found 0.68  SPE Interp. No range found *  IgG (Immunoglobin G), Serum Latest Range: [phone] mg/dL 8657 (H)  IgA Latest Range: 69-380 mg/dL 846 (H)  IgM, Serum Latest Range: 52-322 mg/dL 81  Total Protein, serum electrophor Latest Range: 6.0-8.3 g/dL 7.5  Kappa free light chain Latest Range: 0.33-1.94 mg/dL 9.62 (  H)  Lambda Free Lght Chn Latest Range: 0.57-2.63 mg/dL 1.61 (H)  Kappa:Lambda Ratio Latest Range: 0.26-1.65  1.65    Impression and Plan:  79 year old woman with the following issues:  1. A biclonal elevation of her protein electrophoresis with an M spike of 0.68 g/dL. She has an elevation of IgG  and IgA and both Kappa and lambda. This goes against multiple myeloma or any plasma cell disorder. Her work up has been negative at this point.  I see no need for a bone marrow biopsy but I will continue to follow her and repeat her protein studies in 6 months.   2. Anemia. Multifactorial in nature. This could be an anemia of chronic disease, anemia of renal disease.    [facility], MD 2/28/20143:45 PM

## 2013-06-13 ENCOUNTER — Ambulatory Visit (HOSPITAL_COMMUNITY)
Admission: RE | Admit: 2013-06-13 | Discharge: 2013-06-13 | Disposition: A | Discharge status: Home / Self Care | Payer: Medicare Other | Source: Ambulatory Visit | Attending: Nephrology | Admitting: Nephrology

## 2013-06-13 ENCOUNTER — Other Ambulatory Visit (HOSPITAL_COMMUNITY): Payer: Self-pay | Admitting: Nephrology

## 2013-06-13 DIAGNOSIS — R930 Abnormal findings on diagnostic imaging of skull and head, not elsewhere classified: Secondary | ICD-10-CM | POA: Insufficient documentation

## 2013-06-15 ENCOUNTER — Telehealth: Payer: Self-pay | Admitting: *Deleted

## 2013-06-15 NOTE — Telephone Encounter (Signed)
sw pt informed her that FNS will be out of the office on 07/06/13. gv appt d/t for 07/12/13 labs @1pm  and ov@ 1:30pm. Pt ask me to mail her a reminder , and im gladly doing so...td

## 2013-06-21 ENCOUNTER — Telehealth: Payer: Self-pay | Admitting: Oncology

## 2013-06-21 NOTE — Telephone Encounter (Signed)
Talked to pt and gave pt appt again for September 2014

## 2013-06-21 NOTE — Telephone Encounter (Signed)
Talked to pt gave her appt for September 2014 lab and MD

## 2013-06-22 ENCOUNTER — Telehealth: Payer: Self-pay | Admitting: Oncology

## 2013-06-22 NOTE — Telephone Encounter (Signed)
returned pt call and advised on 9.4.14 appt...pt ok and awaer

## 2013-07-06 ENCOUNTER — Ambulatory Visit: Payer: Medicare Other | Admitting: Oncology

## 2013-07-06 ENCOUNTER — Other Ambulatory Visit: Payer: Medicare Other | Admitting: Lab

## 2013-07-09 DIAGNOSIS — I639 Cerebral infarction, unspecified: Secondary | ICD-10-CM

## 2013-07-09 HISTORY — DX: Cerebral infarction, unspecified: I63.9

## 2013-07-12 ENCOUNTER — Ambulatory Visit (HOSPITAL_BASED_OUTPATIENT_CLINIC_OR_DEPARTMENT_OTHER): Payer: Medicare Other | Admitting: Oncology

## 2013-07-12 ENCOUNTER — Telehealth: Payer: Self-pay | Admitting: Oncology

## 2013-07-12 ENCOUNTER — Other Ambulatory Visit (HOSPITAL_BASED_OUTPATIENT_CLINIC_OR_DEPARTMENT_OTHER): Payer: Medicare Other | Admitting: Lab

## 2013-07-12 VITALS — BP 152/98 | HR 77 | Temp 97.8°F | Resp 18 | Ht 62.0 in | Wt 106.7 lb

## 2013-07-12 DIAGNOSIS — D892 Hypergammaglobulinemia, unspecified: Secondary | ICD-10-CM

## 2013-07-12 DIAGNOSIS — D729 Disorder of white blood cells, unspecified: Secondary | ICD-10-CM

## 2013-07-12 DIAGNOSIS — D649 Anemia, unspecified: Secondary | ICD-10-CM

## 2013-07-12 DIAGNOSIS — D7289 Other specified disorders of white blood cells: Secondary | ICD-10-CM

## 2013-07-12 LAB — COMPREHENSIVE METABOLIC PANEL (CC13)
ALT: 8 U/L (ref 0–55)
AST: 16 U/L (ref 5–34)
Albumin: 3.2 g/dL — ABNORMAL LOW (ref 3.5–5.0)
Alkaline Phosphatase: 103 U/L (ref 40–150)
Calcium: 9.4 mg/dL (ref 8.4–10.4)
Chloride: 113 mEq/L — ABNORMAL HIGH (ref 98–109)
Potassium: 4.6 mEq/L (ref 3.5–5.1)
Sodium: 144 mEq/L (ref 136–145)
Total Protein: 8.3 g/dL (ref 6.4–8.3)

## 2013-07-12 LAB — CBC WITH DIFFERENTIAL/PLATELET
Basophils Absolute: 0 10*3/uL (ref 0.0–0.1)
EOS%: 0.7 % (ref 0.0–7.0)
HGB: 10.2 g/dL — ABNORMAL LOW (ref 11.6–15.9)
MCH: 30.5 pg (ref 25.1–34.0)
MCV: 93.8 fL (ref 79.5–101.0)
MONO%: 9.6 % (ref 0.0–14.0)
NEUT#: 5.7 10*3/uL (ref 1.5–6.5)
RBC: 3.34 10*6/uL — ABNORMAL LOW (ref 3.70–5.45)
RDW: 13.9 % (ref 11.2–14.5)
lymph#: 1.6 10*3/uL (ref 0.9–3.3)

## 2013-07-12 NOTE — Progress Notes (Signed)
Hematology and Oncology Follow Up Visit  Lindsay Shannon [id number] 1923-08-29 79 y.o. 07/12/2013 1:51 PM   Principle Diagnosis: 79 year old with possible plasma cell disorder with M spike about 1.2 g/dL. Quantitative immunoglobulin showed a biclonal elevation in her quantitative immunoglobulin. Her IgG level was elevated at 2406. Her IgA level was 869.  Current therapy: observation and follow up.   Interim History: Lindsay Shannon presents today for a follow up visit. She is a pleasant women with the above history and renal insufficiency. Her multiple myeloma work up is negative at this point. She has no bone lesions or her skeletal survey. She has an elevated Kappa and Lambda chains with a normal ration indicating less likely a monoclonal elevation. She continued to be asymptomatic at this point. She does report fatigue at times and but still functional and lives a lone.   Medications: I have reviewed the patient's current medications.  Current Outpatient Prescriptions  Medication Sig Dispense Refill  . amLODipine (NORVASC) 5 MG tablet Take 10 mg by mouth daily.       Marland Kitchen atenolol (TENORMIN) 50 MG tablet Take 50 mg by mouth daily.       No current facility-administered medications for this visit.    Allergies: No Known Allergies  Past Medical History, Surgical history, Social history, and Family History were reviewed and updated.  Review of Systems: Remaining ROS negative. Physical Exam: Blood pressure 152/98, pulse 77, temperature 97.8 F (36.6 C), temperature source Oral, resp. rate 18, height 5\' 2"  (1.575 m), weight 106 lb 11.2 oz (48.399 kg), SpO2 100.00%. ECOG: 1 General appearance: alert Head: Normocephalic, without obvious abnormality, atraumatic Neck: no adenopathy, no carotid bruit, no JVD, supple, symmetrical, trachea midline and thyroid not enlarged, symmetric, no tenderness/mass/nodules Lymph nodes: Cervical, supraclavicular, and axillary nodes normal. Heart:regular rate and  rhythm, S1, S2 normal, no murmur, click, rub or gallop Lung:chest clear, no wheezing, rales, normal symmetric air entry Abdomin: soft, non-tender, without masses or organomegaly EXT:no erythema, induration, or nodules   Lab Results: Lab Results  Component Value Date   WBC 8.2 07/12/2013   HGB 10.2* 07/12/2013   HCT 31.3* 07/12/2013   MCV 93.8 07/12/2013   PLT 271 07/12/2013     Chemistry      Component Value Date/Time   NA 144 09/06/2012 1032   NA 142 08/03/2008 1030   K 4.2 09/06/2012 1032   K 4.1 08/03/2008 1030   CL 114* 09/06/2012 1032   CL 115* 08/03/2008 1030   CO2 22 09/06/2012 1032   CO2 20 08/03/2008 1030   BUN 26.0 09/06/2012 1032   BUN 13 08/03/2008 1030   CREATININE 2.6* 09/06/2012 1032   CREATININE 2.21* 08/03/2008 1030      Component Value Date/Time   CALCIUM 8.8 09/06/2012 1032   CALCIUM 8.5 08/03/2008 1030   ALKPHOS 96 09/06/2012 1032   ALKPHOS 80 07/31/2008 0340   AST 14 09/06/2012 1032   AST 21 07/31/2008 0340   ALT <6 09/06/2012 1032   ALT 15 07/31/2008 0340   BILITOT 0.24 09/06/2012 1032   BILITOT 0.5 07/31/2008 0340      Impression and Plan:  79 year old woman with the following issues:  1. A biclonal elevation of her protein electrophoresis with an M spike of 0.68 g/dL. She has an elevation of IgG and IgA and both Kappa and lambda. This goes against multiple myeloma or any plasma cell disorder. Her work up has been negative at this point.  I see no  need for a bone marrow biopsy but I will continue to follow her and repeat her protein studies in 6 months.   2. Anemia. Multifactorial in nature. This could be an anemia of chronic disease, anemia of renal disease. Her Hgb is better today.   Lindsay Hose, MD 9/4/20141:51 PM

## 2013-07-12 NOTE — Telephone Encounter (Signed)
gv and printed appt sched and avs forpt for March 2015  °

## 2013-07-13 ENCOUNTER — Encounter: Payer: Self-pay | Admitting: Nephrology

## 2013-07-16 LAB — SPEP & IFE WITH QIG
Albumin ELP: 45.4 % — ABNORMAL LOW (ref 55.8–66.1)
Alpha-1-Globulin: 5.3 % — ABNORMAL HIGH (ref 2.9–4.9)
Alpha-2-Globulin: 9.6 % (ref 7.1–11.8)
Beta 2: 10.9 % — ABNORMAL HIGH (ref 3.2–6.5)
Beta Globulin: 5 % (ref 4.7–7.2)
Gamma Globulin: 23.8 % — ABNORMAL HIGH (ref 11.1–18.8)

## 2013-07-16 LAB — KAPPA/LAMBDA LIGHT CHAINS: Kappa:Lambda Ratio: 1.68 — ABNORMAL HIGH (ref 0.26–1.65)

## 2013-07-23 ENCOUNTER — Inpatient Hospital Stay (HOSPITAL_COMMUNITY)
Admission: EM | Admit: 2013-07-23 | Discharge: 2013-07-27 | DRG: 065 | Disposition: A | Discharge status: Skilled Nursing Facility | Payer: Medicare Other | Attending: Internal Medicine | Admitting: Internal Medicine

## 2013-07-23 ENCOUNTER — Encounter (HOSPITAL_COMMUNITY): Payer: Self-pay | Admitting: *Deleted

## 2013-07-23 DIAGNOSIS — I6529 Occlusion and stenosis of unspecified carotid artery: Secondary | ICD-10-CM | POA: Diagnosis present

## 2013-07-23 DIAGNOSIS — I129 Hypertensive chronic kidney disease with stage 1 through stage 4 chronic kidney disease, or unspecified chronic kidney disease: Secondary | ICD-10-CM | POA: Diagnosis present

## 2013-07-23 DIAGNOSIS — N390 Urinary tract infection, site not specified: Secondary | ICD-10-CM | POA: Diagnosis present

## 2013-07-23 DIAGNOSIS — R4701 Aphasia: Secondary | ICD-10-CM | POA: Diagnosis present

## 2013-07-23 DIAGNOSIS — I4510 Unspecified right bundle-branch block: Secondary | ICD-10-CM | POA: Diagnosis present

## 2013-07-23 DIAGNOSIS — H269 Unspecified cataract: Secondary | ICD-10-CM

## 2013-07-23 DIAGNOSIS — K2970 Gastritis, unspecified, without bleeding: Secondary | ICD-10-CM

## 2013-07-23 DIAGNOSIS — M1990 Unspecified osteoarthritis, unspecified site: Secondary | ICD-10-CM

## 2013-07-23 DIAGNOSIS — N259 Disorder resulting from impaired renal tubular function, unspecified: Secondary | ICD-10-CM

## 2013-07-23 DIAGNOSIS — G459 Transient cerebral ischemic attack, unspecified: Secondary | ICD-10-CM

## 2013-07-23 DIAGNOSIS — D892 Hypergammaglobulinemia, unspecified: Secondary | ICD-10-CM

## 2013-07-23 DIAGNOSIS — Z96649 Presence of unspecified artificial hip joint: Secondary | ICD-10-CM

## 2013-07-23 DIAGNOSIS — E538 Deficiency of other specified B group vitamins: Secondary | ICD-10-CM

## 2013-07-23 DIAGNOSIS — I10 Essential (primary) hypertension: Secondary | ICD-10-CM | POA: Diagnosis present

## 2013-07-23 DIAGNOSIS — K5909 Other constipation: Secondary | ICD-10-CM

## 2013-07-23 DIAGNOSIS — Z79899 Other long term (current) drug therapy: Secondary | ICD-10-CM

## 2013-07-23 DIAGNOSIS — G8190 Hemiplegia, unspecified affecting unspecified side: Secondary | ICD-10-CM | POA: Diagnosis present

## 2013-07-23 DIAGNOSIS — Z66 Do not resuscitate: Secondary | ICD-10-CM | POA: Diagnosis present

## 2013-07-23 DIAGNOSIS — I6350 Cerebral infarction due to unspecified occlusion or stenosis of unspecified cerebral artery: Principal | ICD-10-CM | POA: Diagnosis present

## 2013-07-23 DIAGNOSIS — F0391 Unspecified dementia with behavioral disturbance: Secondary | ICD-10-CM

## 2013-07-23 DIAGNOSIS — D131 Benign neoplasm of stomach: Secondary | ICD-10-CM

## 2013-07-23 DIAGNOSIS — I639 Cerebral infarction, unspecified: Secondary | ICD-10-CM

## 2013-07-23 DIAGNOSIS — R269 Unspecified abnormalities of gait and mobility: Secondary | ICD-10-CM | POA: Diagnosis present

## 2013-07-23 DIAGNOSIS — N189 Chronic kidney disease, unspecified: Secondary | ICD-10-CM

## 2013-07-23 DIAGNOSIS — R471 Dysarthria and anarthria: Secondary | ICD-10-CM | POA: Diagnosis present

## 2013-07-23 DIAGNOSIS — N184 Chronic kidney disease, stage 4 (severe): Secondary | ICD-10-CM | POA: Diagnosis present

## 2013-07-23 DIAGNOSIS — I6509 Occlusion and stenosis of unspecified vertebral artery: Secondary | ICD-10-CM | POA: Diagnosis present

## 2013-07-23 DIAGNOSIS — K649 Unspecified hemorrhoids: Secondary | ICD-10-CM

## 2013-07-23 LAB — COMPREHENSIVE METABOLIC PANEL
ALT: 6 U/L (ref 0–35)
Alkaline Phosphatase: 102 U/L (ref 39–117)
BUN: 31 mg/dL — ABNORMAL HIGH (ref 6–23)
CO2: 20 mEq/L (ref 19–32)
GFR calc Af Amer: 14 mL/min — ABNORMAL LOW (ref >90)
GFR calc non Af Amer: 12 mL/min — ABNORMAL LOW (ref >90)
Glucose, Bld: 98 mg/dL (ref 70–99)
Potassium: 4 mEq/L (ref 3.5–5.1)
Sodium: 144 mEq/L (ref 135–145)
Total Bilirubin: 0.2 mg/dL — ABNORMAL LOW (ref 0.3–1.2)

## 2013-07-23 LAB — CBC
HCT: 28.7 % — ABNORMAL LOW (ref 36.0–46.0)
Hemoglobin: 9.6 g/dL — ABNORMAL LOW (ref 12.0–15.0)
RBC: 3.16 MIL/uL — ABNORMAL LOW (ref 3.87–5.11)

## 2013-07-23 NOTE — ED Notes (Signed)
Per pt's good friend/POA pt admits to fall today and was "on the floor for a while" - pt leaves independently at home alone - pt also exhibiting slurred speech - facial symmetry WNL, pt w/ chronic RLE weakness, equal hand grips. Per pt's friend pt was last seen normal approx 10am today. Pt alert to person, place, and situation - has difficulty stating date.

## 2013-07-24 ENCOUNTER — Inpatient Hospital Stay (HOSPITAL_COMMUNITY): Payer: Medicare Other

## 2013-07-24 ENCOUNTER — Emergency Department (HOSPITAL_COMMUNITY): Payer: Medicare Other

## 2013-07-24 ENCOUNTER — Encounter (HOSPITAL_COMMUNITY): Payer: Self-pay | Admitting: Radiology

## 2013-07-24 ENCOUNTER — Observation Stay (HOSPITAL_COMMUNITY): Payer: Medicare Other

## 2013-07-24 DIAGNOSIS — N390 Urinary tract infection, site not specified: Secondary | ICD-10-CM | POA: Diagnosis present

## 2013-07-24 DIAGNOSIS — D892 Hypergammaglobulinemia, unspecified: Secondary | ICD-10-CM

## 2013-07-24 DIAGNOSIS — I639 Cerebral infarction, unspecified: Secondary | ICD-10-CM | POA: Diagnosis present

## 2013-07-24 DIAGNOSIS — K5909 Other constipation: Secondary | ICD-10-CM

## 2013-07-24 DIAGNOSIS — E538 Deficiency of other specified B group vitamins: Secondary | ICD-10-CM

## 2013-07-24 DIAGNOSIS — G459 Transient cerebral ischemic attack, unspecified: Secondary | ICD-10-CM

## 2013-07-24 DIAGNOSIS — I129 Hypertensive chronic kidney disease with stage 1 through stage 4 chronic kidney disease, or unspecified chronic kidney disease: Secondary | ICD-10-CM | POA: Diagnosis present

## 2013-07-24 DIAGNOSIS — I6350 Cerebral infarction due to unspecified occlusion or stenosis of unspecified cerebral artery: Principal | ICD-10-CM

## 2013-07-24 DIAGNOSIS — M1990 Unspecified osteoarthritis, unspecified site: Secondary | ICD-10-CM

## 2013-07-24 HISTORY — DX: Transient cerebral ischemic attack, unspecified: G45.9

## 2013-07-24 LAB — URINALYSIS, ROUTINE W REFLEX MICROSCOPIC
Bilirubin Urine: NEGATIVE
Glucose, UA: NEGATIVE mg/dL
Glucose, UA: NEGATIVE mg/dL
Hgb urine dipstick: NEGATIVE
Hgb urine dipstick: NEGATIVE
Ketones, ur: NEGATIVE mg/dL
Protein, ur: 30 mg/dL — AB
Specific Gravity, Urine: 1.01 (ref 1.005–1.030)
pH: 5.5 (ref 5.0–8.0)

## 2013-07-24 LAB — URINE MICROSCOPIC-ADD ON

## 2013-07-24 LAB — CK: Total CK: 110 U/L (ref 7–177)

## 2013-07-24 LAB — HEMOGLOBIN A1C
Hgb A1c MFr Bld: 5.6 % (ref <5.7)
Mean Plasma Glucose: 114 mg/dL (ref <117)

## 2013-07-24 LAB — CBC
HCT: 32.8 % — ABNORMAL LOW (ref 36.0–46.0)
MCV: 91.6 fL (ref 78.0–100.0)
RDW: 14 % (ref 11.5–15.5)
WBC: 6.8 10*3/uL (ref 4.0–10.5)

## 2013-07-24 LAB — GLUCOSE, CAPILLARY: Glucose-Capillary: 115 mg/dL — ABNORMAL HIGH (ref 70–99)

## 2013-07-24 LAB — CREATININE, SERUM: GFR calc Af Amer: 17 mL/min — ABNORMAL LOW (ref >90)

## 2013-07-24 MED ORDER — AMLODIPINE BESYLATE 10 MG PO TABS
10.0000 mg | ORAL_TABLET | Freq: Every day | ORAL | Status: DC
  Administered 2013-07-24 – 2013-07-27 (×4): 10 mg via ORAL
  Filled 2013-07-24 (×4): qty 1

## 2013-07-24 MED ORDER — ASPIRIN 325 MG PO TABS
325.0000 mg | ORAL_TABLET | Freq: Every day | ORAL | Status: DC
  Administered 2013-07-24 – 2013-07-27 (×4): 325 mg via ORAL
  Filled 2013-07-24 (×4): qty 1

## 2013-07-24 MED ORDER — SODIUM CHLORIDE 0.9 % IV SOLN
INTRAVENOUS | Status: DC
  Administered 2013-07-24 – 2013-07-26 (×3): via INTRAVENOUS

## 2013-07-24 MED ORDER — HYDRALAZINE HCL 20 MG/ML IJ SOLN: 5.0000 mg | INTRAMUSCULAR | Status: DC | PRN

## 2013-07-24 MED ORDER — CEFTRIAXONE SODIUM 1 G IJ SOLR
1.0000 g | INTRAMUSCULAR | Status: DC
  Administered 2013-07-24 – 2013-07-25 (×2): 1 g via INTRAVENOUS
  Filled 2013-07-24 (×3): qty 10

## 2013-07-24 MED ORDER — ATENOLOL 50 MG PO TABS
50.0000 mg | ORAL_TABLET | Freq: Every day | ORAL | Status: DC
  Administered 2013-07-24 – 2013-07-27 (×4): 50 mg via ORAL
  Filled 2013-07-24 (×4): qty 1

## 2013-07-24 MED ORDER — HEPARIN SODIUM (PORCINE) 5000 UNIT/ML IJ SOLN
5000.0000 [IU] | Freq: Three times a day (TID) | INTRAMUSCULAR | Status: DC
  Administered 2013-07-24 – 2013-07-27 (×10): 5000 [IU] via SUBCUTANEOUS
  Filled 2013-07-24 (×13): qty 1

## 2013-07-24 NOTE — Progress Notes (Signed)
Patient seen and examined, admitted by Dr. Joneen Roach this morning  Briefly 79 year old female with a history of hypertension, ? Dementia who presented to ER with slurred speech, accelerated hypertension. Patient was found to have acute CVA, creatinine of 3.12 - Continue CVA workup - MRI positive for acute infarct in the left external capsule, moderate to advanced atrophy - MRA showed intracranial atherosclerotic ds, stenosis of the distal left vertebral artery. 2-D echo, carotid Dopplers pending, ordered bedside swallow evaluation, speech therapy for PT, OT evaluation - Continue aspirin, BP control - Lipid panel, hemoglobin A1c pending Neurology following   Urinary tract infection?: Urinary with positive leukocytes, WBC 7-10, rare bacteria, F/u urine cultures. For now placed on Rocephin   Cailee Blanke M.D. [facility] 07/24/2013, 1:53 PM  Pager: [phone]

## 2013-07-24 NOTE — Evaluation (Signed)
Occupational Therapy Evaluation Patient Details Name: Lindsay Shannon MRN: [id number] DOB: Mar 18, 1923 Today's Date: 07/24/2013 Time: 4098-1191 OT Time Calculation (min): 16 min  OT Assessment / Plan / Recommendation History of present illness 79 year old female who states she's been having a headache for a "good while". She went to develop dizziness and staggering and fell today. Friends found pt with slurred speech and brought her to ED. Pt with hx of Hip and knee surg in past MRI (+) Acute infarct left external capsule   Clinical Impression   PT admitted with dizziness and s/p fall. Pt currently with functional limitiations due to the deficits listed below (see OT problem list). MRI (+) Left external capsule infarct Pt will benefit from skilled OT to increase their independence and safety with adls and balance to allow discharge CIR. Pt demonstrates balance and cognition deficits that affect adls     OT Assessment  Patient needs continued OT Services    Follow Up Recommendations  CIR    Barriers to Discharge      Equipment Recommendations  Other (comment) (tba)    Recommendations for Other Services Rehab consult  Frequency  Min 2X/week    Precautions / Restrictions Precautions Precautions: Fall (high risk for skin break down) Restrictions Weight Bearing Restrictions: No   Pertinent Vitals/Pain No pain reported    ADL  Eating/Feeding: Set up Where Assessed - Eating/Feeding: Chair Grooming: Wash/dry face;Set up Where Assessed - Grooming: Supported sitting Toilet Transfer: Maximal assistance (dragging right LE) Toilet Transfer Method: Sit to stand Toilet Transfer Equipment: Raised toilet seat with arms (or 3-in-1 over toilet) Toileting - Clothing Manipulation and Hygiene: Maximal assistance Where Assessed - Toileting Clothing Manipulation and Hygiene: Sit to stand from 3-in-1 or toilet Equipment Used: Gait belt Transfers/Ambulation Related to ADLs: Pt prior to admission  ambulating without DME and living alone. pt now with Rt foot dragging.  ADL Comments: Pt supine on arrival. pt educated on reason for OT and asked orientation questions. pt unaware of location. pt educated that she is currently at hospital and pt said "[facility] Pt encouraged for OOB. pt states "nah lets not" when therapist states "Come lets sit up - do you have pain? Do you not want to get up?" Pt pauses and states with laughter "oh I can't even think of an excuse now" Pt voiding bladder and needing max (A) for hygiene    OT Diagnosis: Generalized weakness;Cognitive deficits;Paresis  OT Problem List: Decreased strength;Decreased activity tolerance;Impaired balance (sitting and/or standing);Decreased safety awareness;Decreased knowledge of use of DME or AE;Decreased knowledge of precautions;Decreased cognition;Impaired UE functional use OT Treatment Interventions: Self-care/ADL training;Therapeutic exercise;Neuromuscular education;DME and/or AE instruction;Therapeutic activities;Patient/family education;Balance training;Cognitive remediation/compensation   OT Goals(Current goals can be found in the care plan section) Acute Rehab OT Goals Patient Stated Goal: none specifically stated. Pt admiring furniture in hospital room  OT Goal Formulation: With patient Time For Goal Achievement: 08/07/13 Potential to Achieve Goals: Good  Visit Information  Last OT Received On: 07/24/13 Assistance Needed: +1 History of Present Illness: 79 year old female who states she's been having a headache for a "good while". She went to develop dizziness and staggering and fell today. Friends found pt with slurred speech and brought her to ED. Pt with hx of Hip and knee surg in past MRI (+) Acute infarct left external capsule       Prior Functioning     Home Living Family/patient expects to be discharged to:: Private residence Living Arrangements: Alone Available  Help at Discharge: Family Type of Home:  House Home Access: Stairs to enter Home Layout: One level Prior Function Level of Independence: Independent Communication Communication: Expressive difficulties (slurred speech) Dominant Hand: Right         Vision/Perception Vision - History Patient Visual Report:  (reading calendar with central vision) Vision - Assessment Vision Assessment: Vision not tested   Cognition  Cognition Arousal/Alertness: Awake/alert Behavior During Therapy: WFL for tasks assessed/performed Overall Cognitive Status: Impaired/Different from baseline Area of Impairment: Orientation;Memory;Following commands;Safety/judgement;Awareness;Attention Orientation Level: Disoriented to;Time;Situation Current Attention Level: Sustained Memory: Decreased short-term memory Following Commands: Follows one step commands with increased time Safety/Judgement: Decreased awareness of safety Awareness: Emergent;Anticipatory    Extremity/Trunk Assessment Upper Extremity Assessment Upper Extremity Assessment: RUE deficits/detail RUE Deficits / Details: shoulder flexion 3 out 5, grasp 3 out 5, decr pronation / supination RUE Coordination: decreased fine motor;decreased gross motor Lower Extremity Assessment Lower Extremity Assessment: Defer to PT evaluation Cervical / Trunk Assessment Cervical / Trunk Assessment: Normal     Mobility Bed Mobility Bed Mobility: Supine to Sit;Sitting - Scoot to [place] of Bed;Rolling Left;Left Sidelying to Sit Rolling Left: 4: Min assist Left Sidelying to Sit: 4: Min assist;HOB elevated;With rails Supine to Sit: 4: Min assist;With rails;HOB elevated Sitting - Scoot to Edge of Bed: 4: Min assist Details for Bed Mobility Assistance: cues for safety and progressing toward EOB. pt easily distracted Transfers Transfers: Sit to Stand;Stand to Sit Sit to Stand: 2: Max assist;With upper extremity assist;From bed Stand to Sit: 2: Max assist;With upper extremity assist;To  chair/3-in-1 Details for Transfer Assistance: pt dragging right LE and reaching for environmental supports. pt needed max v/c only to allow therapist to (A).      Exercise     Balance Static Sitting Balance Static Sitting - Balance Support: Bilateral upper extremity supported;Feet supported Static Sitting - Level of Assistance: 5: Stand by assistance   End of Session OT - End of Session Activity Tolerance: Patient tolerated treatment well Patient left: in chair;with call bell/phone within reach Nurse Communication: Mobility status;Precautions  GO Functional Assessment Tool Used: clinical judgement Functional Limitation: Self care Self Care Current Status (O1308): At least 60 percent but less than 80 percent impaired, limited or restricted Self Care Goal Status (M5784): At least 60 percent but less than 80 percent impaired, limited or restricted   Harolyn Rutherford 07/24/2013, 1:36 PM Pager: [phone]

## 2013-07-24 NOTE — Evaluation (Signed)
Speech Language Pathology Evaluation Patient Details Name: Lindsay Shannon MRN: [id number] DOB: 1923/03/01 Today's Date: 07/24/2013 Time: 7829-5621 SLP Time Calculation (min): 20 min  Problem List:  Patient Active Problem List   Diagnosis Date Noted  . TIA (transient ischemic attack) 07/24/2013  . CVA (cerebral infarction) 07/24/2013  . UTI (urinary tract infection) 07/24/2013  . Chronic kidney disease- stage IV 07/24/2013  . Paraproteinemia 09/06/2012  . CATARACTS, BILATERAL 02/08/2008  . VITAMIN B12 DEFICIENCY 08/11/2007  . HYPERTENSION 08/11/2007  . HEMORRHOIDS 08/11/2007  . CONSTIPATION, CHRONIC 08/11/2007  . RENAL INSUFFICIENCY 08/11/2007  . OSTEOARTHRITIS 08/11/2007  . NEOPLASM, BENIGN, STOMACH 01/30/2004  . GASTRITIS 01/30/2004   Past Medical History:  Past Medical History  Diagnosis Date  . Hypertension    Past Surgical History:  Past Surgical History  Procedure Laterality Date  . Partial hip arthroplasty    . Replacement total knee     HPI:  79 y.o. female presenting with difficulty with speech and right sided weakness.  CT negative.  There is a right frontal hygroma that would not cause the patient's presenting symptoms.  F/u MRI revealed acute infarct in the left external capsule.     Assessment / Plan / Recommendation Clinical Impression  Orders received for speech and swallow evals.    Pt presents with a moderate dysarthria of speech secondary to ext capsule infarct on left.  Language comprehension and expression are intact.  Pt's cognition slightly decreased from baseline per family and pt, who describes baseline difficulty with short-term memory.  Pt passed RN stroke swallow screen, therefore formal SLP swallow eval not warranted.    SLP will follow acutely for dysarthria.  Pt/family agree.    SLP Assessment  Patient needs continued Speech Language Pathology Services    Follow Up Recommendations  Inpatient Rehab    Frequency and Duration min 2x/week   1 week   Pertinent Vitals/Pain No pain   SLP Goals  SLP Goals Potential to Achieve Goals: Good Progress/Goals/Alternative treatment plan discussed with pt/caregiver and they: Agree SLP Goal #1: Pt will  identify three methods to assist with speech clarity with no cues. SLP Goal #2: Pt will implement intelligibility strategies during phrase production with min cues for 70% clarity.   SLP Evaluation Prior Functioning  Cognitive/Linguistic Baseline: Baseline deficits Baseline deficit details: short-term memory Type of Home: House Available Help at Discharge: Family   Cognition  Overall Cognitive Status: Impaired/Different from baseline Orientation Level: Disoriented to situation Attention: Sustained Sustained Attention: Appears intact Memory: Impaired Memory Impairment: Retrieval deficit;Decreased short term memory (impaired at baseline) Decreased Short Term Memory: Verbal basic;Functional basic Awareness: Impaired    Comprehension  Auditory Comprehension Overall Auditory Comprehension: Appears within functional limits for tasks assessed Yes/No Questions: Within Functional Limits Commands: Within Functional Limits Conversation: Simple Visual Recognition/Discrimination Discrimination: Within Function Limits Reading Comprehension Reading Status: Within funtional limits    Expression Expression Primary Mode of Expression: Verbal Verbal Expression Overall Verbal Expression: Impaired Initiation: No impairment Automatic Speech: Name;Social Response;Counting Level of Generative/Spontaneous Verbalization: Conversation Repetition: No impairment Naming: No impairment Pragmatics: No impairment Written Expression Dominant Hand: Right   Oral / Motor Oral Motor/Sensory Function Overall Oral Motor/Sensory Function:  (asymmetry CN VII on right) Motor Speech Overall Motor Speech: Impaired Resonance: Hypernasality Articulation: Impaired Intelligibility: Intelligibility  reduced Phrase: 25-49% accurate Sentence: 25-49% accurate Conversation: 25-49% accurate Motor Planning: Witnin functional limits Effective Techniques: Slow rate;Over-articulate;Pause   GO     Blenda Mounts Laurice 07/24/2013, 3:35 PM

## 2013-07-24 NOTE — ED Notes (Signed)
Spoke with CT regarding delay. Pt and family updated.

## 2013-07-24 NOTE — H&P (Signed)
PCP:   Lillia Mountain, MD   Chief Complaint:  Slurred speech  HPI: History provided by ER physician, no one at bedside to assist with providing history. This is a pleasant 79 year old female who states she's been having a headache for a "good while". She went to develop dizziness and staggering and fell today. She states she did not hit her head nor had any loss of consciousness. She denies any nausea or vomiting. Friends visited today and found the patient with slurred speech and brought her to the ER. Here the ER the patient's blood pressure is quite elevated, she states this is chronic for her, stating is occasionally in the 200s..She does have chronic kidney disease. She states she's not urinated since she's been in the ER. During my interview the patient does continued to slur her speech.     Review of Systems:  The patient denies anorexia, fever, weight loss,, vision loss, decreased hearing, hoarseness, chest pain, syncope, dyspnea on exertion, peripheral edema, balance deficits, hemoptysis, abdominal pain, melena, hematochezia, severe indigestion/heartburn, hematuria, incontinence, genital sores, muscle weakness, suspicious skin lesions, transient blindness, difficulty walking, depression, unusual weight change, abnormal bleeding, enlarged lymph nodes, angioedema, and breast masses.  Past Medical History: Past Medical History  Diagnosis Date  . Hypertension    Past Surgical History  Procedure Laterality Date  . Partial hip arthroplasty    . Replacement total knee      Medications: Prior to Admission medications   Medication Sig Start Date End Date Taking? Authorizing Provider  amLODipine (NORVASC) 5 MG tablet Take 10 mg by mouth daily.    Yes Historical Provider, MD  atenolol (TENORMIN) 50 MG tablet Take 50 mg by mouth daily.   Yes Historical Provider, MD    Allergies:  No Known Allergies  Social History:  reports that she has never smoked. She does not have any  smokeless tobacco history on file. She reports that she does not drink alcohol or use illicit drugs. patient lives alone, uses a cane  Family History: History reviewed. No pertinent family history.  Physical Exam: Filed Vitals:   07/24/13 0300 07/24/13 0301 07/24/13 0400 07/24/13 0500  BP: 194/73  176/72 179/78  Pulse: 55  63 55  Temp:  98.9 F (37.2 C)    Resp: 13  20 17   Weight:      SpO2: 100%  100% 100%    General:  Alert and oriented times three, well developed and nourished, no acute distress Eyes: PERRLA, pink conjunctiva, no scleral icterus ENT: Moist oral mucosa, neck supple, no thyromegaly Lungs: clear to ascultation, no wheeze, no crackles, no use of accessory muscles Cardiovascular: regular rate and rhythm, no regurgitation, no gallops, no murmurs. No carotid bruits, no JVD Abdomen: soft, positive BS, non-tender, non-distended, no organomegaly, not an acute abdomen GU: not examined Neuro: CN V11 with some facial asymmetry otherwise cranial nerves are grossly intact, sensation intact Musculoskeletal: strength 5/5 all extremities, except left upper extremity which is weaker approximately 4-1/2 out of 5, no clubbing, cyanosis or edema Skin: no rash, no subcutaneous crepitation, no decubitus Psych: appropriate patient   Labs on Admission:   Recent Labs  07/23/13 2250  NA 144  K 4.0  CL 113*  CO2 20  GLUCOSE 98  BUN 31*  CREATININE 3.12*  CALCIUM 9.2    Recent Labs  07/23/13 2250  AST 16  ALT 6  ALKPHOS 102  BILITOT 0.2*  PROT 7.8  ALBUMIN 3.2*   No results found for  this basename: LIPASE, AMYLASE,  in the last 72 hours  Recent Labs  07/23/13 2250  WBC 6.4  HGB 9.6*  HCT 28.7*  MCV 90.8  PLT 233    Recent Labs  07/23/13 2250  CKTOTAL 110    Micro Results: Ordered   Radiological Exams on Admission: Ct Head Wo Contrast  07/24/2013   CLINICAL DATA:  Aphasia. Fall.  EXAM: CT HEAD WITHOUT CONTRAST  TECHNIQUE: Contiguous axial images  were obtained from the base of the skull through the vertex without intravenous contrast.  COMPARISON:  None.  FINDINGS: Skull:Parietal lucency, noted on recent skull radiography,is not changed in the interim. CT does not add further specificity.  Orbits: Bilateral cataract resection.  Brain: CSF density collection in the right frontal region. There appears to be a vessel, or possibly a septation, centrally. This space measures 14 mm in maximal thickness, and mildly flattens the right frontal pole.  No evidence of acute hemorrhage, hydrocephalus, large territory infarct, or mass lesion. There is global cerebral atrophy and patchy bilateral cerebral white matter low-attenuation. Extensive intracranial calcified atherosclerosis.  IMPRESSION: 1. No evidence of acute intracranial disease. 2. Right frontal hygroma, mildly flattening the right frontal lobe. 3. Diffuse brain atrophy.  Chronic small vessel ischemia.   Electronically Signed   By: Tiburcio Pea   On: 07/24/2013 02:55   EKG: NSR, RBBB  Assessment/Plan Present on Admission:  Slurred speech Bringing for 23 hour observation for TIA workup See orders Neurohospitalist on board Urinalysis ordered  . HYPERTENSION poor control As needed medications the systolic blood pressure   Chronic kidney disease stage IV Monitor i/o's  Chronic/stable Patient does not know the name of her nephrologist    DO NOT RESUSCITATE DVT prophylaxis  Hedda Crumbley 07/24/2013, 5:55 AM

## 2013-07-24 NOTE — ED Notes (Signed)
Pt remains pleasantly confused. Pt is alert to self and situation but unable to tell me her age or what year it is. Pt remains to have slurred speech that is hard to understand at times. Pt with jumbled thought process that family at bedside states is not per her norm. Pt was able to take care of herself prior to admission.

## 2013-07-24 NOTE — ED Provider Notes (Signed)
CSN: [id number]     Arrival date & time 07/23/13  2116 History   First MD Initiated Contact with Patient 07/24/13 0000     Chief Complaint  Patient presents with  . Aphasia  . Fall   (Consider location/radiation/quality/duration/timing/severity/associated sxs/prior Treatment) HPI  Past Medical History  Diagnosis Date  . Hypertension    Past Surgical History  Procedure Laterality Date  . Partial hip arthroplasty    . Replacement total knee     History reviewed. No pertinent family history. History  Substance Use Topics  . Smoking status: Never Smoker   . Smokeless tobacco: Not on file  . Alcohol Use: No   OB History   Grav Para Term Preterm Abortions TAB SAB Ect Mult Living                 Review of Systems  Allergies  Review of patient's allergies indicates no known allergies.  Home Medications   Current Outpatient Rx  Name  Route  Sig  Dispense  Refill  . amLODipine (NORVASC) 5 MG tablet   Oral   Take 10 mg by mouth daily.          Marland Kitchen atenolol (TENORMIN) 50 MG tablet   Oral   Take 50 mg by mouth daily.          BP 186/82  Pulse 55  Temp(Src) 98.9 F (37.2 C)  Resp 15  Wt 106 lb (48.081 kg)  BMI 19.38 kg/m2  SpO2 100% Physical Exam  ED Course  Procedures (including critical care time) Labs Review Labs Reviewed  CBC - Abnormal; Notable for the following:    RBC 3.16 (*)    Hemoglobin 9.6 (*)    HCT 28.7 (*)    All other components within normal limits  COMPREHENSIVE METABOLIC PANEL - Abnormal; Notable for the following:    Chloride 113 (*)    BUN 31 (*)    Creatinine, Ser 3.12 (*)    Albumin 3.2 (*)    Total Bilirubin 0.2 (*)    GFR calc non Af Amer 12 (*)    GFR calc Af Amer 14 (*)    All other components within normal limits  CK  GLUCOSE, CAPILLARY  URINALYSIS, ROUTINE W REFLEX MICROSCOPIC   Imaging Review Ct Head Wo Contrast  07/24/2013   CLINICAL DATA:  Aphasia. Fall.  EXAM: CT HEAD WITHOUT CONTRAST  TECHNIQUE: Contiguous axial  images were obtained from the base of the skull through the vertex without intravenous contrast.  COMPARISON:  None.  FINDINGS: Skull:Parietal lucency, noted on recent skull radiography,is not changed in the interim. CT does not add further specificity.  Orbits: Bilateral cataract resection.  Brain: CSF density collection in the right frontal region. There appears to be a vessel, or possibly a septation, centrally. This space measures 14 mm in maximal thickness, and mildly flattens the right frontal pole.  No evidence of acute hemorrhage, hydrocephalus, large territory infarct, or mass lesion. There is global cerebral atrophy and patchy bilateral cerebral white matter low-attenuation. Extensive intracranial calcified atherosclerosis.  IMPRESSION: 1. No evidence of acute intracranial disease. 2. Right frontal hygroma, mildly flattening the right frontal lobe. 3. Diffuse brain atrophy.  Chronic small vessel ischemia.   Electronically Signed   By: Tiburcio Pea   On: 07/24/2013 02:55    Date: 07/24/2013  Rate: 61  Rhythm: normal sinus rhythm  QRS Axis: normal  Intervals: normal  ST/T Wave abnormalities: nonspecific T wave changes  Conduction Disutrbances:right bundle  branch block  Narrative Interpretation:   Old EKG Reviewed: unchanged    MDM   1. CVA (cerebral infarction)    This patient is an 79 year old female brought to the emergency department for evaluation of expressive aphasia and right-handed weakness. This started earlier when the family found her apartment in this condition. Her last seen normal time was approximately 10 this morning. Workup revealed no acute process on the CT scan laboratory studies are unremarkable as well. Due to her persistent difficulty with speech, Dr. Thad Ranger was consult from neurology who came to see the patient. She feels as though the patient may have had a stroke and recommends admission and further workup. I have spoken to the hospitalist who agrees to  admit.    Geoffery Lyons, MD 07/24/13 [phone]

## 2013-07-24 NOTE — Progress Notes (Signed)
Stroke Team Progress Note  HISTORY TERIANNE THAKER is an 79 y.o. female who reports that for the past month she has been having trouble with weakness and feeling that she was having difficulty with her right leg. She was visited on yesterday and was at her baseline. Speech was at basline when she was spoken to today around midday as well. When she as visited this afternoon they noted that her speech was not normal and felt that her face was drooping. She was brought in for evaluation at that time. Patient was not a TPA candidate secondary to delay in arrival. She was admitted for further evaluation and treatment.  SUBJECTIVE Overall she feels her condition is stable. She wants to get better.  OBJECTIVE Most recent Vital Signs: Filed Vitals:   07/24/13 0500 07/24/13 0600 07/24/13 0949 07/24/13 1051  BP: 179/78 186/82 174/79 177/74  Pulse: 55 55 52 55  Temp:   97.9 F (36.6 C) 97.6 F (36.4 C)  TempSrc:   Oral Oral  Resp: 17 15 16 16   Height:   5' (1.524 m)   Weight:   49.2 kg (108 lb 7.5 oz)   SpO2: 100% 100% 100% 100%   CBG (last 3)   Recent Labs  07/24/13 0003 07/24/13 1155  GLUCAP 87 115*    IV Fluid Intake:   . sodium chloride 75 mL/hr at 07/24/13 1202    MEDICATIONS   . amLODipine  10 mg Oral Daily  . aspirin  325 mg Oral Daily  . atenolol  50 mg Oral Daily  . cefTRIAXone (ROCEPHIN)  IV  1 g Intravenous Q24H  . heparin  5,000 Units Subcutaneous Q8H   PRN:  hydrALAZINE  Diet:  Cardiac thin liquids Activity:   Bathroom privileges with assistance DVT Prophylaxis:  Heparin 5000 units sq tid   CLINICALLY SIGNIFICANT STUDIES Basic Metabolic Panel:  Recent Labs Lab 07/23/13 2250 07/24/13 1115  NA 144  --   K 4.0  --   CL 113*  --   CO2 20  --   GLUCOSE 98  --   BUN 31*  --   CREATININE 3.12* 2.64*  CALCIUM 9.2  --    Liver Function Tests:  Recent Labs Lab 07/23/13 2250  AST 16  ALT 6  ALKPHOS 102  BILITOT 0.2*  PROT 7.8  ALBUMIN 3.2*   CBC:   Recent Labs Lab 07/23/13 2250 07/24/13 1115  WBC 6.4 6.8  HGB 9.6* 10.8*  HCT 28.7* 32.8*  MCV 90.8 91.6  PLT 233 222   Coagulation: No results found for this basename: LABPROT, INR,  in the last 168 hours Cardiac Enzymes:  Recent Labs Lab 07/23/13 2250  CKTOTAL 110   Urinalysis:  Recent Labs Lab 07/24/13 0701  COLORURINE YELLOW  LABSPEC 1.011  PHURINE 5.0  GLUCOSEU NEGATIVE  HGBUR NEGATIVE  BILIRUBINUR NEGATIVE  KETONESUR NEGATIVE  PROTEINUR 30*  UROBILINOGEN 0.2  NITRITE NEGATIVE  LEUKOCYTESUR LARGE*   Lipid Panel No results found for this basename: chol, trig, hdl, cholhdl, vldl, ldlcalc   HgbA1C  No results found for this basename: HGBA1C   Urine Drug Screen:   No results found for this basename: labopia, cocainscrnur, labbenz, amphetmu, thcu, labbarb    Alcohol Level: No results found for this basename: ETH,  in the last 168 hours   CT of the brain  07/24/2013  1. No evidence of acute intracranial disease. 2. Right frontal hygroma, mildly flattening the right frontal lobe. 3. Diffuse brain atrophy.  Chronic small vessel ischemia.    MRI of the brain  07/24/2013    Acute infarct left external capsule  Moderate to advanced atrophy.  Chronic microvascular ischemic change in the white matter, left greater than right.  MRA of the brain  07/24/2013   Intracranial atherosclerotic disease.  There is stenosis in the distal left vertebral artery.  Mild stenosis in the basilar.  There is irregularity of the left posterior cerebral artery.  Atherosclerotic irregularity in the middle cerebral artery branches bilaterally and in the right A1 segment.   2D Echocardiogram    Carotid Doppler    CXR    EKG  normal sinus rhythm, RBBB, 1st degree AV block.   Therapy Recommendations CIR  Physical Exam   GENERAL EXAM: Patient is in no distress  CARDIOVASCULAR: Regular rate and rhythm, no murmurs, no carotid bruits  NEUROLOGIC: MENTAL STATUS: awake, alert, language  MILD APHASIA, comprehension intact, naming intact CRANIAL NERVE: pupils equal and reactive to light, visual fields full to confrontation, extraocular muscles intact, no nystagmus, facial sensation and strength symmetric, uvula midline, shoulder shrug symmetric, tongue midline. MOTOR: RUE 2, RLE 2, LEFT SIDE FULL STRENGTH. SENSORY: normal and symmetric to light touch GAIT/STATION: SITTING AT BEDSIDE.   ASSESSMENT Ms. ESMERALDA MALAY is a 79 y.o. female presenting with Difficulty with speech and facial droop.  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 Infarct most likely felt to be thrombotic secondary to small vessel disease, though workup is pending.  On no antithrombotics prior to admission. Now on aspirin 325 mg orally every day for secondary stroke prevention. Patient with resultant mild right hemiparesis, ataxic gait, mild expressive aphasia and dysarthria. Work up underway.  Hypertension  Abnormal UA, UCx pending  Hospital day # 1  TREATMENT/PLAN  Continue aspirin 325 mg orally every day for secondary stroke prevention.  F/u lipid panel, HgbA1c, carotid dopplers, 2D echo  Rehab consult (i ordered)  Annie Main, MSN, RN, ANVP-BC, ANP-BC, GNP-BC [facility] Pager: [phone] 07/24/2013 1:39 PM  I evaluated and examined patient, reviewed records, labs and imaging, and agree with note and plan.  Suanne Marker, MD 07/24/2013, 10:36 PM Certified in Neurology, Neurophysiology and Neuroimaging [facility] - Stroke Team  Please refer to [email or web address] for on-call Stroke MD

## 2013-07-24 NOTE — Progress Notes (Signed)
Rehab Admissions Coordinator Note:  Patient was screened by Clois Dupes for appropriateness for an Inpatient Acute Rehab Consult.  At this time, we are recommending Inpatient Rehab consult. I will contact Dr. Isidoro Donning.  Clois Dupes 07/24/2013, 4:47 PM  I can be reached at [phone].

## 2013-07-24 NOTE — ED Provider Notes (Signed)
CSN: [id number]     Arrival date & time 07/23/13  2116 History   First MD Initiated Contact with Patient 07/24/13 0000     Chief Complaint  Patient presents with  . Aphasia  . Fall   (Consider location/radiation/quality/duration/timing/severity/associated sxs/prior Treatment) HPI Comments: This patient is a 79 year old female past medical history significant for hypertension and total hip replacement. She is brought to the emergency department by friends for evaluation of slurred speech. She was at home by her self in the friends that brought her today check on her daily. She was found this evening at about 6:30 to be exhibiting slurred speech. Prior to that she was last seen normal at approximately 10 AM. She denies to me that she is having any discomfort. There is no headache, fever, or stiff neck. She denies any weakness in her arms or legs. She lives at home by herself and is normally self-sufficient.  The history is provided by the patient.    Past Medical History  Diagnosis Date  . Hypertension    Past Surgical History  Procedure Laterality Date  . Partial hip arthroplasty    . Replacement total knee     History reviewed. No pertinent family history. History  Substance Use Topics  . Smoking status: Never Smoker   . Smokeless tobacco: Not on file  . Alcohol Use: No   OB History   Grav Para Term Preterm Abortions TAB SAB Ect Mult Living                 Review of Systems  All other systems reviewed and are negative.    Allergies  Review of patient's allergies indicates no known allergies.  Home Medications   Current Outpatient Rx  Name  Route  Sig  Dispense  Refill  . amLODipine (NORVASC) 5 MG tablet   Oral   Take 10 mg by mouth daily.          Marland Kitchen atenolol (TENORMIN) 50 MG tablet   Oral   Take 50 mg by mouth daily.          BP 143/92  Pulse 76  Temp(Src) 97.4 F (36.3 C)  Resp 16  Wt 106 lb (48.081 kg)  BMI 19.38 kg/m2  SpO2 100% Physical Exam   Nursing note and vitals reviewed. Constitutional: She is oriented to person, place, and time. She appears well-developed and well-nourished. No distress.  HENT:  Head: Normocephalic and atraumatic.  Neck: Normal range of motion. Neck supple.  Cardiovascular: Normal rate and regular rhythm.  Exam reveals no gallop and no friction rub.   No murmur heard. Pulmonary/Chest: Effort normal and breath sounds normal. No respiratory distress. She has no wheezes.  Abdominal: Soft. Bowel sounds are normal. She exhibits no distension. There is no tenderness.  Musculoskeletal: Normal range of motion.  Neurological: She is alert and oriented to person, place, and time. No cranial nerve deficit. She exhibits normal muscle tone. Coordination normal.  Patient is alert and oriented. Her speech is rapid and appropriate. There may be some slurring of her words, however I suspect that this is likely her baseline.  Skin: Skin is warm and dry. She is not diaphoretic.    ED Course  Procedures (including critical care time) Labs Review Labs Reviewed  CBC - Abnormal; Notable for the following:    RBC 3.16 (*)    Hemoglobin 9.6 (*)    HCT 28.7 (*)    All other components within normal limits  COMPREHENSIVE METABOLIC  PANEL - Abnormal; Notable for the following:    Chloride 113 (*)    BUN 31 (*)    Creatinine, Ser 3.12 (*)    Albumin 3.2 (*)    Total Bilirubin 0.2 (*)    GFR calc non Af Amer 12 (*)    GFR calc Af Amer 14 (*)    All other components within normal limits  CK  GLUCOSE, CAPILLARY   Imaging Review No results found.   Date: 07/24/2013  Rate: 61  Rhythm: normal sinus rhythm  QRS Axis: normal  Intervals: normal  ST/T Wave abnormalities: nonspecific T wave changes  Conduction Disutrbances:right bundle branch block  Narrative Interpretation:   Old EKG Reviewed: unchanged    MDM  No diagnosis found. Patient presents here with complaints of slurred speech and confusion that started this  evening. Patient's friends who are at bedside state that her speech is definitely altered from her baseline. Workup here reveals a negative CT for stroke and laboratory studies which revealed anemia but otherwise unremarkable. As the patient's speech continues to be garbled and incomprehensible, I will consult neurology. The remainder of her neurology exam is otherwise nonfocal.    Geoffery Lyons, MD 07/24/13 [phone]

## 2013-07-24 NOTE — Consult Note (Signed)
Referring Physician: Delo    Chief Complaint: Difficulty with speech and facial droop  HPI: Lindsay Shannon is an 79 y.o. female who reports that for the past month she has been having trouble with weakness and feeling that she was having difficulty with her right leg.  She was visited on yesterday and was at her baseline.  Speech was at basline when she was spoken to today around midday as well.  When she as visited this afternoon they noted that her speech was not normal and felt that her face was drooping.  She was brought in for evaluation at that time.    Date last known well: Date: 07/23/2013 Time last known well: Time: 11:00 tPA Given: No: Outside time window  Past Medical History  Diagnosis Date  . Hypertension     Past Surgical History  Procedure Laterality Date  . Partial hip arthroplasty    . Replacement total knee      Family history: Mother died of tuberculosis and father died of old age  Social History:  reports that she has never smoked. She does not have any smokeless tobacco history on file. She reports that she does not drink alcohol or use illicit drugs.  Allergies: No Known Allergies  Medications: I have reviewed the patient's current medications. Prior to Admission:  Current outpatient prescriptions: amLODipine (NORVASC) 5 MG tablet, Take 10 mg by mouth daily. , Disp: , Rfl: ;   atenolol (TENORMIN) 50 MG tablet, Take 50 mg by mouth daily., Disp: , Rfl:   ROS: History obtained from the patient  General ROS: negative for - chills, fatigue, fever, night sweats, weight gain or weight loss Psychological ROS: negative for - behavioral disorder, hallucinations, memory difficulties, mood swings or suicidal ideation Ophthalmic ROS: negative for - blurry vision, double vision, eye pain or loss of vision ENT ROS: negative for - epistaxis, nasal discharge, oral lesions, sore throat, tinnitus or vertigo Allergy and Immunology ROS: negative for - hives or itchy/watery  eyes Hematological and Lymphatic ROS: negative for - bleeding problems, bruising or swollen lymph nodes Endocrine ROS: negative for - galactorrhea, hair pattern changes, polydipsia/polyuria or temperature intolerance Respiratory ROS: negative for - cough, hemoptysis, shortness of breath or wheezing Cardiovascular ROS: negative for - chest pain, dyspnea on exertion, edema or irregular heartbeat Gastrointestinal ROS: negative for - abdominal pain, diarrhea, hematemesis, nausea/vomiting or stool incontinence Genito-Urinary ROS: negative for - dysuria, hematuria, incontinence or urinary frequency/urgency Musculoskeletal ROS: right leg pain Neurological ROS: as noted in HPI Dermatological ROS: negative for rash and skin lesion changes  Physical Examination: Blood pressure 179/78, pulse 55, temperature 98.9 F (37.2 C), resp. rate 17, weight 48.081 kg (106 lb), SpO2 100.00%.  Neurologic Examination: Mental Status: Alert, oriented, thought content appropriate.  Speech slurred and at times non fluent.  Able to follow simple commands without difficulty but requires some reinforcement to follow 3 step commands. Cranial Nerves: II: Discs flat bilaterally; Visual fields grossly normal, pupils equal, round, reactive to light and accommodation III,IV, VI: ptosis not present, extra-ocular motions intact bilaterally V,VII: right facial droop, facial light touch sensation normal bilaterally VIII: hearing normal bilaterally IX,X: gag reflex present XI: bilateral shoulder shrug XII: midline tongue extension Motor: Right : Upper extremity   4-/5     Left:     Upper extremity   5/5  Lower extremity   4+/5     Lower extremity   5/5 Tone and bulk:normal tone throughout; no atrophy noted Sensory: Pinprick and  light touch decreased in the RUE Deep Tendon Reflexes: Symmetric throughout Plantars: Right: upgoing   Left: upgoing Cerebellar: normal finger-to-nose and normal heel-to-shin test Gait: Unable to  test CV: pulses palpable throughout   Laboratory Studies:  Basic Metabolic Panel:  Recent Labs Lab 07/23/13 2250  NA 144  K 4.0  CL 113*  CO2 20  GLUCOSE 98  BUN 31*  CREATININE 3.12*  CALCIUM 9.2    Liver Function Tests:  Recent Labs Lab 07/23/13 2250  AST 16  ALT 6  ALKPHOS 102  BILITOT 0.2*  PROT 7.8  ALBUMIN 3.2*   No results found for this basename: LIPASE, AMYLASE,  in the last 168 hours No results found for this basename: AMMONIA,  in the last 168 hours  CBC:  Recent Labs Lab 07/23/13 2250  WBC 6.4  HGB 9.6*  HCT 28.7*  MCV 90.8  PLT 233    Cardiac Enzymes:  Recent Labs Lab 07/23/13 2250  CKTOTAL 110    BNP: No components found with this basename: POCBNP,   CBG:  Recent Labs Lab 07/24/13 0003  GLUCAP 87    Microbiology: No results found for this or any previous visit.  Coagulation Studies: No results found for this basename: LABPROT, INR,  in the last 72 hours  Urinalysis: No results found for this basename: COLORURINE, APPERANCEUR, LABSPEC, PHURINE, GLUCOSEU, HGBUR, BILIRUBINUR, KETONESUR, PROTEINUR, UROBILINOGEN, NITRITE, LEUKOCYTESUR,  in the last 168 hours  Lipid Panel: No results found for this basename: chol, trig, hdl, cholhdl, vldl, ldlcalc    HgbA1C:  No results found for this basename: HGBA1C    Urine Drug Screen:   No results found for this basename: labopia, cocainscrnur, labbenz, amphetmu, thcu, labbarb    Alcohol Level: No results found for this basename: ETH,  in the last 168 hours  Other results: EKG: sinus rhythm at 61 bpm with 1st degree heart block.  Imaging: Ct Head Wo Contrast  07/24/2013   CLINICAL DATA:  Aphasia. Fall.  EXAM: CT HEAD WITHOUT CONTRAST  TECHNIQUE: Contiguous axial images were obtained from the base of the skull through the vertex without intravenous contrast.  COMPARISON:  None.  FINDINGS: Skull:Parietal lucency, noted on recent skull radiography,is not changed in the interim. CT  does not add further specificity.  Orbits: Bilateral cataract resection.  Brain: CSF density collection in the right frontal region. There appears to be a vessel, or possibly a septation, centrally. This space measures 14 mm in maximal thickness, and mildly flattens the right frontal pole.  No evidence of acute hemorrhage, hydrocephalus, large territory infarct, or mass lesion. There is global cerebral atrophy and patchy bilateral cerebral white matter low-attenuation. Extensive intracranial calcified atherosclerosis.  IMPRESSION: 1. No evidence of acute intracranial disease. 2. Right frontal hygroma, mildly flattening the right frontal lobe. 3. Diffuse brain atrophy.  Chronic small vessel ischemia.   Electronically Signed   By: Tiburcio Pea   On: 07/24/2013 02:55    Assessment: 79 y.o. female presenting with difficulty with speech and right sided weakness.  CT reviewed and shows no acute changes.  There is a right frontal hygroma that would not cause the patient's presenting symptoms.  Can not rule out acute infarct.  Patient not a tPA candidate due to being outside time window.    Stroke Risk Factors - hypertension  Plan: 1. HgbA1c, fasting lipid panel 2. MRI, MRA  of the brain without contrast 3. PT consult, OT consult, Speech consult 4. Echocardiogram 5. Carotid dopplers 6. Prophylactic  therapy-Antiplatelet med: Aspirin - dose 325mg  daily 7. Risk factor modification 8. Telemetry monitoring 9. Frequent neuro checks  Case discussed with Dr Judd Lien  Thana Farr, MD [facility] [phone] 07/24/2013, 5:36 AM

## 2013-07-24 NOTE — Evaluation (Signed)
Physical Therapy Evaluation Patient Details Name: Lindsay Shannon MRN: [id number] DOB: 03-17-1923 Today's Date: 07/24/2013 Time: 4098-1191 PT Time Calculation (min): 28 min  PT Assessment / Plan / Recommendation History of Present Illness  79 year old female who states she's been having a headache for a "good while". She went to develop dizziness and staggering and fell today. Friends found pt with slurred speech and brought her to ED. Pt with hx of Hip and knee surg in past MRI (+) Acute infarct left external capsule  Clinical Impression  Pt admitted with the above. Pt with impaired cognition and safety awareness and needs max cues for safety. Pt currently with functional limitations due to the deficits listed below (see PT Problem List). Pt will benefit from skilled PT to increase their independence and safety with mobility to allow discharge to the venue listed below.      PT Assessment  Patient needs continued PT services    Follow Up Recommendations  CIR    Equipment Recommendations  Other (comment) (TBD)    Recommendations for Other Services Rehab consult   Frequency Min 4X/week    Precautions / Restrictions Precautions Precautions: Fall (high risk for skin break down) Restrictions Weight Bearing Restrictions: No   Pertinent Vitals/Pain No c/o pain      Mobility  Bed Mobility Bed Mobility: Supine to Sit;Sitting - Scoot to [place] of Bed;Rolling Left;Left Sidelying to Sit Rolling Left: 4: Min assist Left Sidelying to Sit: 4: Min assist;HOB elevated;With rails Supine to Sit: 4: Min assist;With rails;HOB elevated Sitting - Scoot to Edge of Bed: 4: Min assist Details for Bed Mobility Assistance: cues for safety and progressing toward EOB. pt easily distracted Transfers Transfers: Sit to Stand;Stand to Sit Sit to Stand: 2: Max assist;With upper extremity assist;From bed Stand to Sit: 2: Max assist;With upper extremity assist;To chair/3-in-1 Details for Transfer Assistance:  pt dragging right LE and reaching for environmental supports. pt needed max v/c only to allow therapist to (A).  Ambulation/Gait Ambulation/Gait Assistance: 3: Mod assist Ambulation Distance (Feet): 10 Feet Assistive device: 1 person hand held assist Ambulation/Gait Assistance Details: (A) to maintain balance and complete upright posture due to right LE occasional knee buckling.  Need to further assess pt's ability to use RW.   Pt tends to hold onto objects with left hand when attempting to ambulation.  Gait Pattern: Step-to pattern;Decreased stance time - right;Shuffle;Trunk flexed;Decreased stride length;Narrow base of support Gait velocity: decreased Stairs: No Modified Rankin (Stroke Patients Only) Pre-Morbid Rankin Score: No significant disability Modified Rankin: Severe disability    Exercises     PT Diagnosis: Difficulty walking  PT Problem List: Decreased strength;Decreased activity tolerance;Decreased balance;Decreased mobility;Decreased coordination;Decreased knowledge of use of DME;Decreased safety awareness PT Treatment Interventions: DME instruction;Gait training;Functional mobility training;Therapeutic activities;Therapeutic exercise;Balance training;Neuromuscular re-education;Cognitive remediation;Patient/family education     PT Goals(Current goals can be found in the care plan section) Acute Rehab PT Goals Patient Stated Goal: Did not state PT Goal Formulation: With patient Time For Goal Achievement: 07/31/13 Potential to Achieve Goals: Good  Visit Information  Last PT Received On: 07/24/13 Assistance Needed: +1 History of Present Illness: 79 year old female who states she's been having a headache for a "good while". She went to develop dizziness and staggering and fell today. Friends found pt with slurred speech and brought her to ED. Pt with hx of Hip and knee surg in past MRI (+) Acute infarct left external capsule       Prior Functioning  Home  Living Family/patient  expects to be discharged to:: Private residence Living Arrangements: Alone Available Help at Discharge: Family Type of Home: House Home Access: Stairs to enter Home Layout: One level Prior Function Level of Independence: Independent Communication Communication: Expressive difficulties (slurred speech) Dominant Hand: Right    Cognition  Cognition Arousal/Alertness: Awake/alert Behavior During Therapy: WFL for tasks assessed/performed Overall Cognitive Status: Impaired/Different from baseline Area of Impairment: Orientation;Memory;Following commands;Safety/judgement;Awareness;Attention Orientation Level: Disoriented to;Time;Situation Current Attention Level: Sustained Memory: Decreased short-term memory Following Commands: Follows one step commands with increased time Safety/Judgement: Decreased awareness of safety Awareness: Emergent;Anticipatory    Extremity/Trunk Assessment Upper Extremity Assessment Upper Extremity Assessment: RUE deficits/detail RUE Deficits / Details: shoulder flexion 3 out 5, grasp 3 out 5, decr pronation / supination RUE Coordination: decreased fine motor;decreased gross motor Lower Extremity Assessment Lower Extremity Assessment: RLE deficits/detail RLE Deficits / Details: quad 3/5, hip flexor 2+/5; difficult to assess DF/PF due to cognition RLE Coordination: decreased gross motor Cervical / Trunk Assessment Cervical / Trunk Assessment: Normal   Balance Balance Balance Assessed: Yes Static Sitting Balance Static Sitting - Balance Support: Bilateral upper extremity supported;Feet supported Static Sitting - Level of Assistance: 5: Stand by assistance  End of Session PT - End of Session Equipment Utilized During Treatment: Gait belt Activity Tolerance: Patient tolerated treatment well Patient left: in chair;with call bell/phone within reach;with family/visitor present Nurse Communication: Mobility status  GP      Lindsay Shannon 07/24/2013, 2:14 PM  Jake Shark, PT DPT [phone]

## 2013-07-24 NOTE — Progress Notes (Signed)
While performing shift assessment on patient, pt complained of new right facial numbness. Patient also unable to raise right arm, which was not documented previously or passed along in report. NIH increased from 4 to 7. VSS. Neurology notified. CT scan ordered. Will accompany patient to CT. Awaiting their call. Will continue to monitor.

## 2013-07-24 NOTE — ED Notes (Signed)
Pt sleeping. 

## 2013-07-24 NOTE — ED Notes (Signed)
Dr. Reynolds at bedside.

## 2013-07-24 NOTE — ED Notes (Signed)
Admitting at bedside. Family at bedside has went home for the morning.

## 2013-07-24 NOTE — ED Notes (Addendum)
Pt given coffee. Updated on POC. Pt remains on monitor.

## 2013-07-25 DIAGNOSIS — I10 Essential (primary) hypertension: Secondary | ICD-10-CM

## 2013-07-25 DIAGNOSIS — I6330 Cerebral infarction due to thrombosis of unspecified cerebral artery: Secondary | ICD-10-CM

## 2013-07-25 DIAGNOSIS — I359 Nonrheumatic aortic valve disorder, unspecified: Secondary | ICD-10-CM

## 2013-07-25 DIAGNOSIS — N390 Urinary tract infection, site not specified: Secondary | ICD-10-CM

## 2013-07-25 DIAGNOSIS — N189 Chronic kidney disease, unspecified: Secondary | ICD-10-CM

## 2013-07-25 LAB — BASIC METABOLIC PANEL
BUN: 29 mg/dL — ABNORMAL HIGH (ref 6–23)
Calcium: 8.8 mg/dL (ref 8.4–10.5)
Creatinine, Ser: 2.86 mg/dL — ABNORMAL HIGH (ref 0.50–1.10)
GFR calc Af Amer: 16 mL/min — ABNORMAL LOW (ref >90)
GFR calc non Af Amer: 14 mL/min — ABNORMAL LOW (ref >90)

## 2013-07-25 LAB — LIPID PANEL
LDL Cholesterol: 89 mg/dL (ref 0–99)
Total CHOL/HDL Ratio: 2.8 RATIO
VLDL: 11 mg/dL (ref 0–40)

## 2013-07-25 LAB — RAPID URINE DRUG SCREEN, HOSP PERFORMED: Benzodiazepines: NOT DETECTED

## 2013-07-25 MED ORDER — LORAZEPAM 2 MG/ML IJ SOLN
1.0000 mg | Freq: Once | INTRAMUSCULAR | Status: AC
  Administered 2013-07-25: 1 mg via INTRAVENOUS
  Filled 2013-07-25: qty 1

## 2013-07-25 NOTE — Progress Notes (Signed)
Physical Therapy Treatment Patient Details Name: EMILEE MARKET MRN: [id number] DOB: 06-11-1923 Today's Date: 07/25/2013 Time: 4098-1191 PT Time Calculation (min): 29 min  PT Assessment / Plan / Recommendation  History of Present Illness 79 year old female who states she's been having a headache for a "good while". She went to develop dizziness and staggering and fell today. Friends found pt with slurred speech and brought her to ED. Pt with hx of Hip and knee surg in past MRI (+) Acute infarct left external capsule   PT Comments   Pt is very motivated to work with PT today.  She continues to move quickly and has decreased awareness of where her body is in space and whether or not she is balanced in both sitting and in standing.  She needs quite a bit of help during gait and would benefit from a second person to be safe ambulating.  I agree, as long as her right shoulder is not subluxed that we may want to start trying a R PFRW.  PT continues to recommend CIR level therapies at discharge.    Follow Up Recommendations  CIR     Does the patient have the potential to tolerate intense rehabilitation    Yes  Barriers to Discharge   decreased caregiver support-she lived alone PTA      Equipment Recommendations  Wheelchair (measurements PT);Wheelchair cushion (measurements PT);Rolling walker with 5" wheels (with R platform)    Recommendations for Other Services   NA  Frequency Min 4X/week   Progress towards PT Goals Progress towards PT goals: Progressing toward goals  Plan Current plan remains appropriate    Precautions / Restrictions Precautions Precautions: Fall Precaution Comments: impulsive, decreased awareness of deficits, right sided inattention and left gaze preference.     Pertinent Vitals/Pain See vitals flow sheet.     Mobility  Bed Mobility Bed Mobility: Supine to Sit;Sitting - Scoot to Edge of Bed Rolling Left: 3: Mod assist;With rail Left Sidelying to Sit: 3: Mod  assist;With rails;HOB elevated Details for Bed Mobility Assistance: mod assist to support trunk to get to sitting EOB.  Max verbal, tactile and manual cues for sequencing and for sustained attention to task.  I asled her to come out of the right side of the bed and I believe her right inattention contributed to this being more difficult today compared to yesterday.   Transfers Transfers: Sit to Stand;Stand to Sit Sit to Stand: 2: Max assist;With upper extremity assist;With armrests;From bed Stand to Sit: 2: Max assist;With upper extremity assist;With armrests;To chair/3-in-1 Details for Transfer Assistance: max assist to support trunk for balance and stability over right buckling knee.  Max verbal, tactile and manual cues for foot placement prior to standing and to support trunk for balance during transitions.  Pt with decreased awareness of body in space as she felt I was pulling her over when in fact I was pulling her to midline.   Ambulation/Gait Ambulation/Gait Assistance: 2: Max assist Ambulation Distance (Feet): 10 Feet Assistive device: 1 person hand held assist Ambulation/Gait Assistance Details: assist to weght shift body to the left so she could unweight and move right leg forward, assist to stabilize trunk for balance and support trunk when WB through right leg as the knee buckles in stance.  I need to assess her right shoulder to see if she could tolerate a right platform RW.   Gait Pattern: Step-to pattern;Decreased stance time - right;Shuffle;Trunk flexed;Decreased stride length;Wide base of support Modified Rankin (Stroke Patients Only) Modified  Rankin: Severe disability      PT Goals (current goals can now be found in the care plan section) Acute Rehab PT Goals Patient Stated Goal: to go home  Visit Information  Last PT Received On: 07/25/13 Assistance Needed: +2 (for gait) History of Present Illness: 79 year old female who states she's been having a headache for a "good  while". She went to develop dizziness and staggering and fell today. Friends found pt with slurred speech and brought her to ED. Pt with hx of Hip and knee surg in past MRI (+) Acute infarct left external capsule    Subjective Data  Subjective: Pt very distractable, difficulty articulating her words.   Patient Stated Goal: to go home   Cognition  Cognition Arousal/Alertness: Awake/alert Behavior During Therapy: Impulsive Overall Cognitive Status: Impaired/Different from baseline Current Attention Level: Sustained Memory: Decreased short-term memory Following Commands: Follows one step commands with increased time Safety/Judgement: Decreased awareness of safety;Decreased awareness of deficits Awareness: Emergent    Balance  Balance Balance Assessed: Yes Static Sitting Balance Static Sitting - Balance Support: Bilateral upper extremity supported;Feet supported Static Sitting - Level of Assistance: 5: Stand by assistance Static Sitting - Comment/# of Minutes: supervision, only if not moving Dynamic Sitting Balance Dynamic Sitting - Balance Support: Bilateral upper extremity supported;Feet supported Dynamic Sitting - Level of Assistance: 3: Mod assist Dynamic Sitting Balance - Compensations: up to mod assist for dynamic sitting balance while scooting to EOB.  LOB usually occurs posteriorly and to the right.   Static Standing Balance Static Standing - Balance Support: Left upper extremity supported Static Standing - Level of Assistance: 3: Mod assist Dynamic Standing Balance Dynamic Standing - Balance Support: Left upper extremity supported Dynamic Standing - Level of Assistance: 2: Max assist Dynamic Standing - Comments: due to trunk support and safety during gait.    End of Session PT - End of Session Equipment Utilized During Treatment: Gait belt Activity Tolerance: Patient limited by fatigue Patient left: in chair;with call bell/phone within reach;with family/visitor present      Lurena Joiner B. Daune Divirgilio, PT, DPT [phone]   07/25/2013, 12:09 PM

## 2013-07-25 NOTE — Progress Notes (Signed)
Stroke Team Progress Note  HISTORY Lindsay Shannon is an 79 y.o. female who reports that for the past month she has been having trouble with weakness and feeling that she was having difficulty with her right leg. She was visited on yesterday and was at her baseline. Speech was at basline when she was spoken to today around midday as well. When she as visited this afternoon they noted that her speech was not normal and felt that her face was drooping. She was brought in for evaluation at that time. Patient was not a TPA candidate secondary to delay in arrival. She was admitted for further evaluation and treatment.  SUBJECTIVE Family at bedside. Pt lying in the bed, talkative.  OBJECTIVE Most recent Vital Signs: Filed Vitals:   07/24/13 2122 07/25/13 0117 07/25/13 0519 07/25/13 1050  BP: 157/64 141/82 154/77 154/65  Pulse: 64 61 77 55  Temp: 98.3 F (36.8 C) 98.2 F (36.8 C) 98.6 F (37 C) 97.8 F (36.6 C)  TempSrc: Oral Oral Oral Oral  Resp: 18 16 16 16   Height:      Weight:      SpO2: 100% 100% 100% 100%   CBG (last 3)   Recent Labs  07/24/13 0003 07/24/13 1155 07/24/13 1633  GLUCAP 87 115* 155*    IV Fluid Intake:   . sodium chloride 75 mL/hr at 07/24/13 1202    MEDICATIONS   . amLODipine  10 mg Oral Daily  . aspirin  325 mg Oral Daily  . atenolol  50 mg Oral Daily  . cefTRIAXone (ROCEPHIN)  IV  1 g Intravenous Q24H  . heparin  5,000 Units Subcutaneous Q8H   PRN:  hydrALAZINE  Diet:  Cardiac thin liquids Activity:   Bathroom privileges with assistance DVT Prophylaxis:  Heparin 5000 units sq tid   CLINICALLY SIGNIFICANT STUDIES Basic Metabolic Panel:   Recent Labs Lab 07/23/13 2250 07/24/13 1115 07/25/13 0440  NA 144  --  142  K 4.0  --  4.6  CL 113*  --  110  CO2 20  --  20  GLUCOSE 98  --  85  BUN 31*  --  29*  CREATININE 3.12* 2.64* 2.86*  CALCIUM 9.2  --  8.8   Liver Function Tests:   Recent Labs Lab 07/23/13 2250  AST 16  ALT 6  ALKPHOS  102  BILITOT 0.2*  PROT 7.8  ALBUMIN 3.2*   CBC:   Recent Labs Lab 07/23/13 2250 07/24/13 1115  WBC 6.4 6.8  HGB 9.6* 10.8*  HCT 28.7* 32.8*  MCV 90.8 91.6  PLT 233 222   Coagulation: No results found for this basename: LABPROT, INR,  in the last 168 hours Cardiac Enzymes:   Recent Labs Lab 07/23/13 2250  CKTOTAL 110   Urinalysis:   Recent Labs Lab 07/24/13 0701 07/24/13 2309  COLORURINE YELLOW YELLOW  LABSPEC 1.011 1.010  PHURINE 5.0 5.5  GLUCOSEU NEGATIVE NEGATIVE  HGBUR NEGATIVE NEGATIVE  BILIRUBINUR NEGATIVE NEGATIVE  KETONESUR NEGATIVE NEGATIVE  PROTEINUR 30* NEGATIVE  UROBILINOGEN 0.2 0.2  NITRITE NEGATIVE NEGATIVE  LEUKOCYTESUR LARGE* SMALL*   Lipid Panel     Component Value Date/Time   CHOL 155 07/25/2013 0440   TRIG 57 07/25/2013 0440   HDL 55 07/25/2013 0440   CHOLHDL 2.8 07/25/2013 0440   VLDL 11 07/25/2013 0440   LDLCALC 89 07/25/2013 0440   HgbA1C  Lab Results  Component Value Date   HGBA1C 5.6 07/24/2013   Urine Drug Screen:  Component Value Date/Time   LABOPIA NONE DETECTED 07/24/2013 2309    Alcohol Level: No results found for this basename: ETH,  in the last 168 hours   CT of the brain  07/24/2013  1. No evidence of acute intracranial disease. 2. Right frontal hygroma, mildly flattening the right frontal lobe. 3. Diffuse brain atrophy.  Chronic small vessel ischemia.    MRI of the brain  07/24/2013    Acute infarct left external capsule  Moderate to advanced atrophy.  Chronic microvascular ischemic change in the white matter, left greater than right.  MRA of the brain  07/24/2013   Intracranial atherosclerotic disease.  There is stenosis in the distal left vertebral artery.  Mild stenosis in the basilar.  There is irregularity of the left posterior cerebral artery.  Atherosclerotic irregularity in the middle cerebral artery branches bilaterally and in the right A1 segment.   2D Echocardiogram    Carotid Doppler    CXR    EKG   normal sinus rhythm, RBBB, 1st degree AV block.   Therapy Recommendations CIR  Physical Exam   GENERAL EXAM: Patient is in no distress  CARDIOVASCULAR: Regular rate and rhythm, no murmurs, no carotid bruits  NEUROLOGIC: MENTAL STATUS: awake, alert, language MILD APHASIA, comprehension intact, naming intact CRANIAL NERVE: pupils equal and reactive to light, visual fields full to confrontation, extraocular muscles intact, no nystagmus, facial sensation and strength symmetric, uvula midline, shoulder shrug symmetric, tongue midline. MOTOR: RUE 2, RLE 2, LEFT SIDE FULL STRENGTH. SENSORY: normal and symmetric to light touch GAIT/STATION: SITTING AT BEDSIDE.   ASSESSMENT Lindsay Shannon is a 79 y.o. female presenting with Difficulty with speech and facial droop.  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 Infarct most likely thrombotic secondary to small vessel disease, though workup is pending.  On no antithrombotics prior to admission. Now on aspirin 325 mg orally every day for secondary stroke prevention. Patient with resultant mild right hemiparesis, ataxic gait, mild expressive aphasia and dysarthria. Work up underway. CIR recommended.  Hypertension  Abnormal UA, UCx pending  Hospital day # 2  TREATMENT/PLAN  Continue aspirin 325 mg orally every day for secondary stroke prevention.  F/u carotid dopplers, 2D echo, UCx  Rehab consult in place  [facility], MSN, RN, ANVP-BC, ANP-BC, [facility] Pager: [phone] 07/25/2013 11:15 AM  I have personally examined this patient, reviewed pertinent data, and about plan of care and agree with the above. Delia Heady, MD

## 2013-07-25 NOTE — Progress Notes (Signed)
[facility] PROGRESS NOTE  Lindsay Shannon ZOX:[id number] DOB: [birth year] DOA: 07/23/2013 PCP: Lillia Mountain, MD  Assessment/Plan:  79 year old female with a history of hypertension, ? Dementia who presented to ER with slurred speech, accelerated hypertension. Patient was found to have acute CVA, creatinine of 3.12  CVA: - MRI positive for acute infarct in the left external capsule, moderate to advanced atrophy  - MRA showed intracranial atherosclerotic ds, stenosis of the distal left vertebral artery.  2-D echo -carotid Dopplers pending -bedside swallow evaluation- heart healthy -PT/OT rec CIR  - Continue aspirin, BP control  - Lipid panel, hemoglobin A1c pending  Neurology following   Urinary tract infection?: Urinary with positive leukocytes, WBC 7-10, rare bacteria, F/u urine cultures. For now placed on Rocephin  CKD- stage IV- baseline about 3.5  Code Status: DNR Family Communication: patient Disposition Plan: CIR eval?  DOES NOT FOLLOW WITH DR. Valentina Lucks ANYMORE  Consultants:  neuro  Procedures: Echo  Antibiotics:  rocehpin  HPI/Subjective: No new c/o Thinks she ate a good breakfast  Objective: Filed Vitals:   07/25/13 0519  BP: 154/77  Pulse: 77  Temp: 98.6 F (37 C)  Resp: 16   No intake or output data in the 24 hours ending 07/25/13 0931 Filed Weights   07/23/13 2143 07/24/13 0949  Weight: 48.081 kg (106 lb) 49.2 kg (108 lb 7.5 oz)    Exam:   General:  Elderly female, speech difficult to understand  Cardiovascular: rrr  Respiratory: clear anterior  Abdomen: +BS, soft  Musculoskeletal: moves all 4 ext   Data Reviewed: Basic Metabolic Panel:  Recent Labs Lab 07/23/13 2250 07/24/13 1115 07/25/13 0440  NA 144  --  142  K 4.0  --  4.6  CL 113*  --  110  CO2 20  --  20  GLUCOSE 98  --  85  BUN 31*  --  29*  CREATININE 3.12* 2.64* 2.86*  CALCIUM 9.2  --  8.8   Liver Function Tests:  Recent Labs Lab 07/23/13 2250   AST 16  ALT 6  ALKPHOS 102  BILITOT 0.2*  PROT 7.8  ALBUMIN 3.2*   No results found for this basename: LIPASE, AMYLASE,  in the last 168 hours No results found for this basename: AMMONIA,  in the last 168 hours CBC:  Recent Labs Lab 07/23/13 2250 07/24/13 1115  WBC 6.4 6.8  HGB 9.6* 10.8*  HCT 28.7* 32.8*  MCV 90.8 91.6  PLT 233 222   Cardiac Enzymes:  Recent Labs Lab 07/23/13 2250  CKTOTAL 110   BNP (last 3 results) No results found for this basename: PROBNP,  in the last 8760 hours CBG:  Recent Labs Lab 07/24/13 0003 07/24/13 1155 07/24/13 1633  GLUCAP 87 115* 155*    No results found for this or any previous visit (from the past 240 hour(s)).   Studies: Ct Head Wo Contrast  07/25/2013   *RADIOLOGY REPORT*  Clinical Data: New onset right-sided weakness.  CT HEAD WITHOUT CONTRAST  Technique:  Contiguous axial images were obtained from the base of the skull through the vertex without contrast.  Comparison: MRI of the brain July 24, 2013 at 0838 hours.  Findings: Lentiform hypodensity in the left external capsule, as described on prior MRI without hemorrhagic conversion.  Additional scattered focal hypodensities most consistent with moderate white matter changes, better seen on prior MRI.  No acute large vascular territory infarct, midline shift or mass effect.  Moderate ventriculomegaly, likely on the bases of  global parenchymal brain volume loss.  Right anterior cranial fossa and 9 mm low density fluid collection may reflect remote subdural hematoma or hygroma and results in mild effacement the subjacent sulci.  Basal cisterns are patent. Moderate to severe calcific atherosclerosis of the carotid siphons.  Status post bilateral ocular lens implants.  Degenerative change of the included cervical spine.  Moderate temporomandibular osteoarthrosis.  No paranasal sinus air-fluid levels, mastoid air cells appear well aerated.  No skull fracture.  IMPRESSION: Evolving  left external capsule lacunar infarct, seen to better detail on MR of the brain from same date, reported separately.  No hemorrhagic conversion.  Stable appearance of brain:  Involutional changes and  moderate white matter changes suggestive of chronic small vessel ischemic disease.   Original Report Authenticated By: Awilda Metro   Ct Head Wo Contrast  07/24/2013   CLINICAL DATA:  Aphasia. Fall.  EXAM: CT HEAD WITHOUT CONTRAST  TECHNIQUE: Contiguous axial images were obtained from the base of the skull through the vertex without intravenous contrast.  COMPARISON:  None.  FINDINGS: Skull:Parietal lucency, noted on recent skull radiography,is not changed in the interim. CT does not add further specificity.  Orbits: Bilateral cataract resection.  Brain: CSF density collection in the right frontal region. There appears to be a vessel, or possibly a septation, centrally. This space measures 14 mm in maximal thickness, and mildly flattens the right frontal pole.  No evidence of acute hemorrhage, hydrocephalus, large territory infarct, or mass lesion. There is global cerebral atrophy and patchy bilateral cerebral white matter low-attenuation. Extensive intracranial calcified atherosclerosis.  IMPRESSION: 1. No evidence of acute intracranial disease. 2. Right frontal hygroma, mildly flattening the right frontal lobe. 3. Diffuse brain atrophy.  Chronic small vessel ischemia.   Electronically Signed   By: Tiburcio Pea   On: 07/24/2013 02:55   Mri Brain Without Contrast  07/24/2013   *RADIOLOGY REPORT*  Clinical Data:  TIA.  Hypertension  MRI HEAD WITHOUT CONTRAST MRA HEAD WITHOUT CONTRAST  Technique:  Multiplanar, multiecho pulse sequences of the brain and surrounding structures were obtained without intravenous contrast. Angiographic images of the head were obtained using MRA technique without contrast.  Comparison:  CT head 07/24/2013  MRI HEAD  Findings:  Small area acute infarction left external capsule.  No  other acute infarct.  Moderate to advanced cortical atrophy.  Chronic microvascular ischemic changes in the white matter, left greater than right. Chronic ischemic change in the right internal capsule  Negative for intracranial hemorrhage.  Negative for mass lesion. No midline shift.  Ventricle size is appropriate for the level of atrophy.  IMPRESSION: Acute infarct left external capsule  Moderate to advanced atrophy.  Chronic microvascular ischemic change in the white matter, left greater than right.  MRA HEAD  Findings: Image quality degraded by motion  The right vertebral artery is patent to the basilar.  The left vertebral artery is nondominant but appears to abruptly change caliber at the level the skull base suggestive of atherosclerotic disease.  There is a small patent lumen extending to the basilar. The basilar is irregular with atherosclerotic disease.  Posterior cerebral arteries are patent bilaterally with mild atherosclerotic disease on the left.  PICA and superior cerebellar arteries are patent but appear to be disease.  Internal carotid artery is patent bilaterally with mild atherosclerotic disease in the cavernous segment.  Mild stenosis right A1 segment.  Moderate stenosis of the right MCA branches.  Left anterior cerebral artery is patent.  Left M1 segment  is patent.  Mild atherosclerotic disease in the left middle cerebral artery branches  IMPRESSION: Intracranial atherosclerotic disease.  There is stenosis in the distal left vertebral artery.  Mild stenosis in the basilar.  There is irregularity of the left posterior cerebral artery.  Atherosclerotic irregularity in the middle cerebral artery branches bilaterally and in the right A1 segment.   Original Report Authenticated By: Janeece Riggers, M.D.   Mr Mra Head/brain Wo Cm  07/24/2013   *RADIOLOGY REPORT*  Clinical Data:  TIA.  Hypertension  MRI HEAD WITHOUT CONTRAST MRA HEAD WITHOUT CONTRAST  Technique:  Multiplanar, multiecho pulse sequences of  the brain and surrounding structures were obtained without intravenous contrast. Angiographic images of the head were obtained using MRA technique without contrast.  Comparison:  CT head 07/24/2013  MRI HEAD  Findings:  Small area acute infarction left external capsule.  No other acute infarct.  Moderate to advanced cortical atrophy.  Chronic microvascular ischemic changes in the white matter, left greater than right. Chronic ischemic change in the right internal capsule  Negative for intracranial hemorrhage.  Negative for mass lesion. No midline shift.  Ventricle size is appropriate for the level of atrophy.  IMPRESSION: Acute infarct left external capsule  Moderate to advanced atrophy.  Chronic microvascular ischemic change in the white matter, left greater than right.  MRA HEAD  Findings: Image quality degraded by motion  The right vertebral artery is patent to the basilar.  The left vertebral artery is nondominant but appears to abruptly change caliber at the level the skull base suggestive of atherosclerotic disease.  There is a small patent lumen extending to the basilar. The basilar is irregular with atherosclerotic disease.  Posterior cerebral arteries are patent bilaterally with mild atherosclerotic disease on the left.  PICA and superior cerebellar arteries are patent but appear to be disease.  Internal carotid artery is patent bilaterally with mild atherosclerotic disease in the cavernous segment.  Mild stenosis right A1 segment.  Moderate stenosis of the right MCA branches.  Left anterior cerebral artery is patent.  Left M1 segment is patent.  Mild atherosclerotic disease in the left middle cerebral artery branches  IMPRESSION: Intracranial atherosclerotic disease.  There is stenosis in the distal left vertebral artery.  Mild stenosis in the basilar.  There is irregularity of the left posterior cerebral artery.  Atherosclerotic irregularity in the middle cerebral artery branches bilaterally and in the  right A1 segment.   Original Report Authenticated By: Janeece Riggers, M.D.    Scheduled Meds: . amLODipine  10 mg Oral Daily  . aspirin  325 mg Oral Daily  . atenolol  50 mg Oral Daily  . cefTRIAXone (ROCEPHIN)  IV  1 g Intravenous Q24H  . heparin  5,000 Units Subcutaneous Q8H   Continuous Infusions: . sodium chloride 75 mL/hr at 07/24/13 1202    Principal Problem:   CVA (cerebral infarction) Active Problems:   HYPERTENSION   UTI (urinary tract infection)   Chronic kidney disease- stage IV    Time spent: 35    [facility], Thurlow Gallaga  [facility] Pager [phone]. If 7PM-7AM, please contact night-coverage at [email or web address], password [facility] 07/25/2013, 9:31 AM  LOS: 2 days

## 2013-07-25 NOTE — Progress Notes (Signed)
Pt eating less than 25% of each meal, pt becomes frustrated when urged to eat, claiming she is not hungry.  Talked to family/caretakers about the situation, they say this is not a new issue with her.  Diet changed today to D3, as pt doesn't have her teeth with her, no changes in eating behavior.  Will continue to encourage.

## 2013-07-25 NOTE — Progress Notes (Signed)
VASCULAR LAB PRELIMINARY  PRELIMINARY  PRELIMINARY  PRELIMINARY  Carotid duplex completed.    Preliminary report:  Bilateral:  1-39% ICA stenosis.  Vertebral artery flow is antegrade.  Technically difficult due to severe tortuosity bilaterally.   Aaran Enberg, RVS 07/25/2013, 3:14 PM

## 2013-07-25 NOTE — Progress Notes (Signed)
  Echocardiogram 2D Echocardiogram has been performed.  Cathie Beams 07/25/2013, 3:27 PM

## 2013-07-25 NOTE — Progress Notes (Signed)
Pt became very combative and anxious when this nurse and coworker changed patients bed, swinging and yelling at [place]. Patient ripped her IV out and is refusing a new IV. Patient has had multiple attempts to get OOB. Bed alarm in place. On call NP notified. Will continue to monitor.

## 2013-07-25 NOTE — Consult Note (Signed)
Physical Medicine and Rehabilitation Consult Reason for Consult: Thrombotic left external capsule infarction Referring Physician: [facility]   HPI: Lindsay Shannon is a 79 y.o. right handed female with history of hypertension. Admitted 07/24/2013 with slurred speech as well as headache with dizziness and right-sided weakness. MRI of the brain showed acute infarct left external capsule as well as MRA showing stenosis in the left distal left vertebral artery. Patient did not receive TPA. Echocardiogram and carotid Dopplers are pending. Patient currently maintained on aspirin therapy for CVA prophylaxis as well as the addition of subcutaneous heparin for DVT prophylaxis. Urine study showed WBC 7-10 with rare bacteria cultures pending placed on empiric Rocephin. Physical therapy evaluation completed 07/24/2013 with recommendations of physical medicine rehabilitation consult to consider inpatient rehabilitation services. Pt talkative Not aware she had CVA   Review of Systems  Gastrointestinal: Positive for constipation.  Musculoskeletal: Positive for myalgias and joint pain.  Neurological: Positive for dizziness and headaches.  Psychiatric/Behavioral: Positive for memory loss.  All other systems reviewed and are negative.   Past Medical History  Diagnosis Date  . Hypertension    Past Surgical History  Procedure Laterality Date  . Partial hip arthroplasty    . Replacement total knee     History reviewed. No pertinent family history. Social History:  reports that she has never smoked. She does not have any smokeless tobacco history on file. She reports that she does not drink alcohol or use illicit drugs. Allergies: No Known Allergies Medications Prior to Admission  Medication Sig Dispense Refill  . amLODipine (NORVASC) 5 MG tablet Take 10 mg by mouth daily.       Marland Kitchen atenolol (TENORMIN) 50 MG tablet Take 50 mg by mouth daily.        Home: Home Living Family/patient expects to be discharged  to:: Private residence Living Arrangements: Alone Available Help at Discharge: Family Type of Home: House Home Access: Stairs to enter Home Layout: One level  Functional History:   Functional Status:  Mobility: Bed Mobility Bed Mobility: Supine to Sit;Sitting - Scoot to [place] of Bed;Rolling Left;Left Sidelying to Sit Rolling Left: 4: Min assist Left Sidelying to Sit: 4: Min assist;HOB elevated;With rails Supine to Sit: 4: Min assist;With rails;HOB elevated Sitting - Scoot to Edge of Bed: 4: Min assist Transfers Transfers: Sit to Stand;Stand to Sit Sit to Stand: 2: Max assist;With upper extremity assist;From bed Stand to Sit: 2: Max assist;With upper extremity assist;To chair/3-in-1 Ambulation/Gait Ambulation/Gait Assistance: 3: Mod assist Ambulation Distance (Feet): 10 Feet Assistive device: 1 person hand held assist Ambulation/Gait Assistance Details: (A) to maintain balance and complete upright posture due to right LE occasional knee buckling.  Need to further assess pt's ability to use RW.   Pt tends to hold onto objects with left hand when attempting to ambulation.  Gait Pattern: Step-to pattern;Decreased stance time - right;Shuffle;Trunk flexed;Decreased stride length;Narrow base of support Gait velocity: decreased Stairs: No    ADL: ADL Eating/Feeding: Set up Where Assessed - Eating/Feeding: Chair Grooming: Wash/dry face;Set up Where Assessed - Grooming: Supported sitting Toilet Transfer: Maximal assistance (dragging right LE) Toilet Transfer Method: Sit to stand Toilet Transfer Equipment: Raised toilet seat with arms (or 3-in-1 over toilet) Equipment Used: Gait belt Transfers/Ambulation Related to ADLs: Pt prior to admission ambulating without DME and living alone. pt now with Rt foot dragging.  ADL Comments: Pt supine on arrival. pt educated on reason for OT and asked orientation questions. pt unaware of location. pt educated that she is  currently at hospital and pt  said "[facility] am at [facility]?" Pt encouraged for OOB. pt states "nah lets not" when therapist states "Come lets sit up - do you have pain? Do you not want to get up?" Pt pauses and states with laughter "oh I can't even think of an excuse now" Pt voiding bladder and needing max (A) for hygiene  Cognition: Cognition Overall Cognitive Status: Impaired/Different from baseline Orientation Level: Oriented to person;Disoriented to place;Disoriented to time;Disoriented to situation Attention: Sustained Sustained Attention: Appears intact Memory: Impaired Memory Impairment: Retrieval deficit;Decreased short term memory (impaired at baseline) Decreased Short Term Memory: Verbal basic;Functional basic Awareness: Impaired Cognition Arousal/Alertness: Awake/alert Behavior During Therapy: WFL for tasks assessed/performed Overall Cognitive Status: Impaired/Different from baseline Area of Impairment: Orientation;Memory;Following commands;Safety/judgement;Awareness;Attention Orientation Level: Disoriented to;Time;Situation Current Attention Level: Sustained Memory: Decreased short-term memory Following Commands: Follows one step commands with increased time Safety/Judgement: Decreased awareness of safety Awareness: Emergent;Anticipatory  Blood pressure 154/77, pulse 77, temperature 98.6 F (37 C), temperature source Oral, resp. rate 16, height 5' (1.524 m), weight 49.2 kg (108 lb 7.5 oz), SpO2 100.00%. Physical Exam  Vitals reviewed. HENT:  Head: Normocephalic.  Eyes: EOM are normal.  Neck: Normal range of motion. Neck supple. No thyromegaly present.  Cardiovascular: Normal rate and regular rhythm.   Pulmonary/Chest: Effort normal and breath sounds normal. No respiratory distress.  Abdominal: Soft. Bowel sounds are normal. She exhibits no distension.  Neurological: She is alert.  Patient did provide her date of birth but not appropriate age. She did name the hospital with cues. Patient follows simple  commands. Speech was dysarthric but intelligible  Skin: Skin is warm and dry.  Motor 1/5 R delt, bi,Tri, grip, 3- R HF, KE and ADF,APF 4/5 on Left side in UE and LE Sensory intact to LT adn temp Results for orders placed during the hospital encounter of 07/23/13 (from the past 24 hour(s))  URINALYSIS, ROUTINE W REFLEX MICROSCOPIC     Status: Abnormal   Collection Time    07/24/13  7:01 AM      Result Value Range   Color, Urine YELLOW  YELLOW   APPearance CLEAR  CLEAR   Specific Gravity, Urine 1.011  1.005 - 1.030   pH 5.0  5.0 - 8.0   Glucose, UA NEGATIVE  NEGATIVE mg/dL   Hgb urine dipstick NEGATIVE  NEGATIVE   Bilirubin Urine NEGATIVE  NEGATIVE   Ketones, ur NEGATIVE  NEGATIVE mg/dL   Protein, ur 30 (*) NEGATIVE mg/dL   Urobilinogen, UA 0.2  0.0 - 1.0 mg/dL   Nitrite NEGATIVE  NEGATIVE   Leukocytes, UA LARGE (*) NEGATIVE  URINE MICROSCOPIC-ADD ON     Status: Abnormal   Collection Time    07/24/13  7:01 AM      Result Value Range   WBC, UA 7-10  <3 WBC/hpf   Bacteria, UA RARE  RARE   Casts GRANULAR CAST (*) NEGATIVE  HEMOGLOBIN A1C     Status: None   Collection Time    07/24/13 11:15 AM      Result Value Range   Hemoglobin A1C 5.6  <5.7 %   Mean Plasma Glucose 114  <117 mg/dL  CBC     Status: Abnormal   Collection Time    07/24/13 11:15 AM      Result Value Range   WBC 6.8  4.0 - 10.5 K/uL   RBC 3.58 (*) 3.87 - 5.11 MIL/uL   Hemoglobin 10.8 (*) 12.0 - 15.0 g/dL  HCT 32.8 (*) 36.0 - 46.0 %   MCV 91.6  78.0 - 100.0 fL   MCH 30.2  26.0 - 34.0 pg   MCHC 32.9  30.0 - 36.0 g/dL   RDW 16.1  09.6 - 04.5 %   Platelets 222  150 - 400 K/uL  CREATININE, SERUM     Status: Abnormal   Collection Time    07/24/13 11:15 AM      Result Value Range   Creatinine, Ser 2.64 (*) 0.50 - 1.10 mg/dL   GFR calc non Af Amer 15 (*) >90 mL/min   GFR calc Af Amer 17 (*) >90 mL/min  GLUCOSE, CAPILLARY     Status: Abnormal   Collection Time    07/24/13 11:55 AM      Result Value Range    Glucose-Capillary 115 (*) 70 - 99 mg/dL   Comment 1 Documented in Chart    GLUCOSE, CAPILLARY     Status: Abnormal   Collection Time    07/24/13  4:33 PM      Result Value Range   Glucose-Capillary 155 (*) 70 - 99 mg/dL  URINE RAPID DRUG SCREEN (HOSP PERFORMED)     Status: None   Collection Time    07/24/13 11:09 PM      Result Value Range   Opiates NONE DETECTED  NONE DETECTED   Cocaine NONE DETECTED  NONE DETECTED   Benzodiazepines NONE DETECTED  NONE DETECTED   Amphetamines NONE DETECTED  NONE DETECTED   Tetrahydrocannabinol NONE DETECTED  NONE DETECTED   Barbiturates NONE DETECTED  NONE DETECTED  URINALYSIS, ROUTINE W REFLEX MICROSCOPIC     Status: Abnormal   Collection Time    07/24/13 11:09 PM      Result Value Range   Color, Urine YELLOW  YELLOW   APPearance CLEAR  CLEAR   Specific Gravity, Urine 1.010  1.005 - 1.030   pH 5.5  5.0 - 8.0   Glucose, UA NEGATIVE  NEGATIVE mg/dL   Hgb urine dipstick NEGATIVE  NEGATIVE   Bilirubin Urine NEGATIVE  NEGATIVE   Ketones, ur NEGATIVE  NEGATIVE mg/dL   Protein, ur NEGATIVE  NEGATIVE mg/dL   Urobilinogen, UA 0.2  0.0 - 1.0 mg/dL   Nitrite NEGATIVE  NEGATIVE   Leukocytes, UA SMALL (*) NEGATIVE  URINE MICROSCOPIC-ADD ON     Status: None   Collection Time    07/24/13 11:09 PM      Result Value Range   WBC, UA 3-6  <3 WBC/hpf   RBC / HPF 0-2  <3 RBC/hpf   Bacteria, UA RARE  RARE  BASIC METABOLIC PANEL     Status: Abnormal   Collection Time    07/25/13  4:40 AM      Result Value Range   Sodium 142  135 - 145 mEq/L   Potassium 4.6  3.5 - 5.1 mEq/L   Chloride 110  96 - 112 mEq/L   CO2 20  19 - 32 mEq/L   Glucose, Bld 85  70 - 99 mg/dL   BUN 29 (*) 6 - 23 mg/dL   Creatinine, Ser 4.09 (*) 0.50 - 1.10 mg/dL   Calcium 8.8  8.4 - 81.1 mg/dL   GFR calc non Af Amer 14 (*) >90 mL/min   GFR calc Af Amer 16 (*) >90 mL/min   Ct Head Wo Contrast  07/25/2013   *RADIOLOGY REPORT*  Clinical Data: New onset right-sided weakness.  CT HEAD  WITHOUT CONTRAST  Technique:  Contiguous  axial images were obtained from the base of the skull through the vertex without contrast.  Comparison: MRI of the brain July 24, 2013 at 0838 hours.  Findings: Lentiform hypodensity in the left external capsule, as described on prior MRI without hemorrhagic conversion.  Additional scattered focal hypodensities most consistent with moderate white matter changes, better seen on prior MRI.  No acute large vascular territory infarct, midline shift or mass effect.  Moderate ventriculomegaly, likely on the bases of global parenchymal brain volume loss.  Right anterior cranial fossa and 9 mm low density fluid collection may reflect remote subdural hematoma or hygroma and results in mild effacement the subjacent sulci.  Basal cisterns are patent. Moderate to severe calcific atherosclerosis of the carotid siphons.  Status post bilateral ocular lens implants.  Degenerative change of the included cervical spine.  Moderate temporomandibular osteoarthrosis.  No paranasal sinus air-fluid levels, mastoid air cells appear well aerated.  No skull fracture.  IMPRESSION: Evolving left external capsule lacunar infarct, seen to better detail on MR of the brain from same date, reported separately.  No hemorrhagic conversion.  Stable appearance of brain:  Involutional changes and  moderate white matter changes suggestive of chronic small vessel ischemic disease.   Original Report Authenticated By: Awilda Metro   Ct Head Wo Contrast  07/24/2013   CLINICAL DATA:  Aphasia. Fall.  EXAM: CT HEAD WITHOUT CONTRAST  TECHNIQUE: Contiguous axial images were obtained from the base of the skull through the vertex without intravenous contrast.  COMPARISON:  None.  FINDINGS: Skull:Parietal lucency, noted on recent skull radiography,is not changed in the interim. CT does not add further specificity.  Orbits: Bilateral cataract resection.  Brain: CSF density collection in the right frontal region.  There appears to be a vessel, or possibly a septation, centrally. This space measures 14 mm in maximal thickness, and mildly flattens the right frontal pole.  No evidence of acute hemorrhage, hydrocephalus, large territory infarct, or mass lesion. There is global cerebral atrophy and patchy bilateral cerebral white matter low-attenuation. Extensive intracranial calcified atherosclerosis.  IMPRESSION: 1. No evidence of acute intracranial disease. 2. Right frontal hygroma, mildly flattening the right frontal lobe. 3. Diffuse brain atrophy.  Chronic small vessel ischemia.   Electronically Signed   By: Tiburcio Pea   On: 07/24/2013 02:55   Mri Brain Without Contrast  07/24/2013   *RADIOLOGY REPORT*  Clinical Data:  TIA.  Hypertension  MRI HEAD WITHOUT CONTRAST MRA HEAD WITHOUT CONTRAST  Technique:  Multiplanar, multiecho pulse sequences of the brain and surrounding structures were obtained without intravenous contrast. Angiographic images of the head were obtained using MRA technique without contrast.  Comparison:  CT head 07/24/2013  MRI HEAD  Findings:  Small area acute infarction left external capsule.  No other acute infarct.  Moderate to advanced cortical atrophy.  Chronic microvascular ischemic changes in the white matter, left greater than right. Chronic ischemic change in the right internal capsule  Negative for intracranial hemorrhage.  Negative for mass lesion. No midline shift.  Ventricle size is appropriate for the level of atrophy.  IMPRESSION: Acute infarct left external capsule  Moderate to advanced atrophy.  Chronic microvascular ischemic change in the white matter, left greater than right.  MRA HEAD  Findings: Image quality degraded by motion  The right vertebral artery is patent to the basilar.  The left vertebral artery is nondominant but appears to abruptly change caliber at the level the skull base suggestive of atherosclerotic disease.  There is a small  patent lumen extending to the basilar.  The basilar is irregular with atherosclerotic disease.  Posterior cerebral arteries are patent bilaterally with mild atherosclerotic disease on the left.  PICA and superior cerebellar arteries are patent but appear to be disease.  Internal carotid artery is patent bilaterally with mild atherosclerotic disease in the cavernous segment.  Mild stenosis right A1 segment.  Moderate stenosis of the right MCA branches.  Left anterior cerebral artery is patent.  Left M1 segment is patent.  Mild atherosclerotic disease in the left middle cerebral artery branches  IMPRESSION: Intracranial atherosclerotic disease.  There is stenosis in the distal left vertebral artery.  Mild stenosis in the basilar.  There is irregularity of the left posterior cerebral artery.  Atherosclerotic irregularity in the middle cerebral artery branches bilaterally and in the right A1 segment.   Original Report Authenticated By: Janeece Riggers, M.D.   Mr Mra Head/brain Wo Cm  07/24/2013   *RADIOLOGY REPORT*  Clinical Data:  TIA.  Hypertension  MRI HEAD WITHOUT CONTRAST MRA HEAD WITHOUT CONTRAST  Technique:  Multiplanar, multiecho pulse sequences of the brain and surrounding structures were obtained without intravenous contrast. Angiographic images of the head were obtained using MRA technique without contrast.  Comparison:  CT head 07/24/2013  MRI HEAD  Findings:  Small area acute infarction left external capsule.  No other acute infarct.  Moderate to advanced cortical atrophy.  Chronic microvascular ischemic changes in the white matter, left greater than right. Chronic ischemic change in the right internal capsule  Negative for intracranial hemorrhage.  Negative for mass lesion. No midline shift.  Ventricle size is appropriate for the level of atrophy.  IMPRESSION: Acute infarct left external capsule  Moderate to advanced atrophy.  Chronic microvascular ischemic change in the white matter, left greater than right.  MRA HEAD  Findings: Image quality  degraded by motion  The right vertebral artery is patent to the basilar.  The left vertebral artery is nondominant but appears to abruptly change caliber at the level the skull base suggestive of atherosclerotic disease.  There is a small patent lumen extending to the basilar. The basilar is irregular with atherosclerotic disease.  Posterior cerebral arteries are patent bilaterally with mild atherosclerotic disease on the left.  PICA and superior cerebellar arteries are patent but appear to be disease.  Internal carotid artery is patent bilaterally with mild atherosclerotic disease in the cavernous segment.  Mild stenosis right A1 segment.  Moderate stenosis of the right MCA branches.  Left anterior cerebral artery is patent.  Left M1 segment is patent.  Mild atherosclerotic disease in the left middle cerebral artery branches  IMPRESSION: Intracranial atherosclerotic disease.  There is stenosis in the distal left vertebral artery.  Mild stenosis in the basilar.  There is irregularity of the left posterior cerebral artery.  Atherosclerotic irregularity in the middle cerebral artery branches bilaterally and in the right A1 segment.   Original Report Authenticated By: Janeece Riggers, M.D.    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 Does the need for close, 24 hr/day medical supervision in concert with the patient's rehab needs make it unreasonable for this patient to be served in a less intensive setting? Yes 2. Co-Morbidities requiring supervision/potential complications: CKD IV, HTN, prior R ext capsule infarct and frontal hygroma 3. Due to bladder management, bowel management, safety, skin/wound care, disease management, medication administration, pain management and patient education, does the patient require 24 hr/day rehab nursing? Yes 4. Does the patient require  coordinated care of a physician, rehab nurse, PT (1-2 hrs/day, 5 days/week), OT (1-2 hrs/day, 5 days/week) and  SLP (.5-1 hrs/day, 5 days/week) to address physical and functional deficits in the context of the above medical diagnosis(es)? Yes Addressing deficits in the following areas: balance, endurance, locomotion, strength, transferring, bowel/bladder control, bathing, dressing and toileting 5. Can the patient actively participate in an intensive therapy program of at least 3 hrs of therapy per day at least 5 days per week? Yes 6. The potential for patient to make measurable gains while on inpatient rehab is good 7. Anticipated functional outcomes upon discharge from inpatient rehab are Sup mob with PT, sup ADL with OT, eval cognition with SLP. 8. Estimated rehab length of stay to reach the above functional goals is: 2 wks 9. Does the patient have adequate social supports to accommodate these discharge functional goals? Potentially 10. Anticipated D/C setting: Home 11. Anticipated post D/C treatments: HH therapy 12. Overall Rehab/Functional Prognosis: good  RECOMMENDATIONS: This patient's condition is appropriate for continued rehabilitative care in the following setting: CIR Patient has agreed to participate in recommended program. Yes Note that insurance prior authorization may be required for reimbursement for recommended care.  Comment:     07/25/2013

## 2013-07-26 ENCOUNTER — Inpatient Hospital Stay (HOSPITAL_COMMUNITY): Payer: Medicare Other | Admitting: Occupational Therapy

## 2013-07-26 ENCOUNTER — Inpatient Hospital Stay (HOSPITAL_COMMUNITY): Payer: Medicare Other | Admitting: Rehabilitation

## 2013-07-26 ENCOUNTER — Inpatient Hospital Stay (HOSPITAL_COMMUNITY): Payer: Medicare Other

## 2013-07-26 DIAGNOSIS — F0391 Unspecified dementia with behavioral disturbance: Secondary | ICD-10-CM

## 2013-07-26 LAB — BASIC METABOLIC PANEL
BUN: 27 mg/dL — ABNORMAL HIGH (ref 6–23)
Chloride: 109 mEq/L (ref 96–112)
Creatinine, Ser: 2.52 mg/dL — ABNORMAL HIGH (ref 0.50–1.10)
GFR calc Af Amer: 19 mL/min — ABNORMAL LOW (ref >90)
GFR calc non Af Amer: 16 mL/min — ABNORMAL LOW (ref >90)
Glucose, Bld: 71 mg/dL (ref 70–99)
Potassium: 4.3 mEq/L (ref 3.5–5.1)

## 2013-07-26 LAB — CBC
HCT: 33.2 % — ABNORMAL LOW (ref 36.0–46.0)
Hemoglobin: 11.1 g/dL — ABNORMAL LOW (ref 12.0–15.0)
MCHC: 33.4 g/dL (ref 30.0–36.0)
MCV: 91.7 fL (ref 78.0–100.0)
RBC: 3.62 MIL/uL — ABNORMAL LOW (ref 3.87–5.11)
RDW: 14 % (ref 11.5–15.5)
WBC: 5.4 10*3/uL (ref 4.0–10.5)

## 2013-07-26 LAB — URINE CULTURE

## 2013-07-26 MED ORDER — HYDRALAZINE HCL 20 MG/ML IJ SOLN: 10.0000 mg | INTRAMUSCULAR | Status: DC | PRN

## 2013-07-26 NOTE — Progress Notes (Signed)
[facility] PROGRESS NOTE  Lindsay Shannon JYN:[id number] DOB: 1923-03-06 DOA: 07/23/2013 PCP: No primary provider on file.  Assessment/Plan:  79 year old female with a history of hypertension, ? Dementia who presented to ER with slurred speech, accelerated hypertension. Patient was found to have acute CVA, creatinine of 3.12   CVA: - MRI positive for acute infarct in the left external capsule, moderate to advanced atrophy  - MRA showed intracranial atherosclerotic ds, stenosis of the distal left vertebral artery.  2-D echo: Left ventricle: The cavity size was normal. Wall thickness was increased in a pattern of mild LVH. The estimated ejection fraction was 60%. Wall motion was normal; there were no regional wall motion abnormalities. - Aortic valve: Sclerosis without stenosis. Mild regurgitation. - Impressions: No cardiac source of embolism was identified, but cannot be ruled out on the basis of this examination  -carotid Dopplers: Bilateral: 1-39% ICA stenosis  -bedside swallow evaluation- DYS3- not a big eater per family at baseline -PT/OT rec CIR but suspect with her dementia, she may need SNF- will defer to rehab dr - Continue aspirin - Lipid panel LDL: 89, hemoglobin A1c ok Neurology following   Urinary tract infection?: Urinary with positive leukocytes, WBC 7-10, rare bacteria, F/u urine cultures negative, d/c rocephin  CKD- stage IV- baseline about 3.5  Code Status: DNR Family Communication: patient Disposition Plan: CIR eval? Vs snf  DOES NOT FOLLOW WITH DR. Valentina Lucks ANYMORE  Consultants:  neuro  Procedures: Echo  Antibiotics:  rocehpin d/c'd 9.18  HPI/Subjective: onfused last PM C/o being cold this AM Speech slurred  Objective: Filed Vitals:   07/26/13 0535  BP: 180/76  Pulse: 66  Temp: 97.5 F (36.4 C)  Resp: 18   No intake or output data in the 24 hours ending 07/26/13 0859 Filed Weights   07/23/13 2143 07/24/13 0949  Weight: 48.081 kg  (106 lb) 49.2 kg (108 lb 7.5 oz)    Exam:   General:  Elderly female, speech difficult to understand  Cardiovascular: rrr  Respiratory: clear anterior  Abdomen: +BS, soft  Musculoskeletal: moves all 4 ext   Data Reviewed: Basic Metabolic Panel:  Recent Labs Lab 07/23/13 2250 07/24/13 1115 07/25/13 0440 07/26/13 0515  NA 144  --  142 136  K 4.0  --  4.6 4.3  CL 113*  --  110 109  CO2 20  --  20 14*  GLUCOSE 98  --  85 71  BUN 31*  --  29* 27*  CREATININE 3.12* 2.64* 2.86* 2.52*  CALCIUM 9.2  --  8.8 8.6   Liver Function Tests:  Recent Labs Lab 07/23/13 2250  AST 16  ALT 6  ALKPHOS 102  BILITOT 0.2*  PROT 7.8  ALBUMIN 3.2*   No results found for this basename: LIPASE, AMYLASE,  in the last 168 hours No results found for this basename: AMMONIA,  in the last 168 hours CBC:  Recent Labs Lab 07/23/13 2250 07/24/13 1115 07/26/13 0515  WBC 6.4 6.8 5.4  HGB 9.6* 10.8* 11.1*  HCT 28.7* 32.8* 33.2*  MCV 90.8 91.6 91.7  PLT 233 222 186   Cardiac Enzymes:  Recent Labs Lab 07/23/13 2250  CKTOTAL 110   BNP (last 3 results) No results found for this basename: PROBNP,  in the last 8760 hours CBG:  Recent Labs Lab 07/24/13 0003 07/24/13 1155 07/24/13 1633  GLUCAP 87 115* 155*    Recent Results (from the past 240 hour(s))  URINE CULTURE     Status: None  Collection Time    07/24/13 11:09 PM      Result Value Range Status   Specimen Description URINE, RANDOM   Final   Special Requests NONE   Final   Culture  Setup Time     Final   Value: 07/25/2013 00:43     Performed at [facility]   Colony Count     Final   Value: NO GROWTH     Performed at [facility]   Culture     Final   Value: NO GROWTH     Performed at [facility]   Report Status 07/26/2013 FINAL   Final     Studies: Ct Head Wo Contrast  07/25/2013   *RADIOLOGY REPORT*  Clinical Data: New onset right-sided weakness.  CT HEAD WITHOUT CONTRAST   Technique:  Contiguous axial images were obtained from the base of the skull through the vertex without contrast.  Comparison: MRI of the brain July 24, 2013 at 0838 hours.  Findings: Lentiform hypodensity in the left external capsule, as described on prior MRI without hemorrhagic conversion.  Additional scattered focal hypodensities most consistent with moderate white matter changes, better seen on prior MRI.  No acute large vascular territory infarct, midline shift or mass effect.  Moderate ventriculomegaly, likely on the bases of global parenchymal brain volume loss.  Right anterior cranial fossa and 9 mm low density fluid collection may reflect remote subdural hematoma or hygroma and results in mild effacement the subjacent sulci.  Basal cisterns are patent. Moderate to severe calcific atherosclerosis of the carotid siphons.  Status post bilateral ocular lens implants.  Degenerative change of the included cervical spine.  Moderate temporomandibular osteoarthrosis.  No paranasal sinus air-fluid levels, mastoid air cells appear well aerated.  No skull fracture.  IMPRESSION: Evolving left external capsule lacunar infarct, seen to better detail on MR of the brain from same date, reported separately.  No hemorrhagic conversion.  Stable appearance of brain:  Involutional changes and  moderate white matter changes suggestive of chronic small vessel ischemic disease.   Original Report Authenticated By: Awilda Metro   Mri Brain Without Contrast  07/24/2013   *RADIOLOGY REPORT*  Clinical Data:  TIA.  Hypertension  MRI HEAD WITHOUT CONTRAST MRA HEAD WITHOUT CONTRAST  Technique:  Multiplanar, multiecho pulse sequences of the brain and surrounding structures were obtained without intravenous contrast. Angiographic images of the head were obtained using MRA technique without contrast.  Comparison:  CT head 07/24/2013  MRI HEAD  Findings:  Small area acute infarction left external capsule.  No other acute infarct.   Moderate to advanced cortical atrophy.  Chronic microvascular ischemic changes in the white matter, left greater than right. Chronic ischemic change in the right internal capsule  Negative for intracranial hemorrhage.  Negative for mass lesion. No midline shift.  Ventricle size is appropriate for the level of atrophy.  IMPRESSION: Acute infarct left external capsule  Moderate to advanced atrophy.  Chronic microvascular ischemic change in the white matter, left greater than right.  MRA HEAD  Findings: Image quality degraded by motion  The right vertebral artery is patent to the basilar.  The left vertebral artery is nondominant but appears to abruptly change caliber at the level the skull base suggestive of atherosclerotic disease.  There is a small patent lumen extending to the basilar. The basilar is irregular with atherosclerotic disease.  Posterior cerebral arteries are patent bilaterally with mild atherosclerotic disease on the left.  PICA and superior cerebellar  arteries are patent but appear to be disease.  Internal carotid artery is patent bilaterally with mild atherosclerotic disease in the cavernous segment.  Mild stenosis right A1 segment.  Moderate stenosis of the right MCA branches.  Left anterior cerebral artery is patent.  Left M1 segment is patent.  Mild atherosclerotic disease in the left middle cerebral artery branches  IMPRESSION: Intracranial atherosclerotic disease.  There is stenosis in the distal left vertebral artery.  Mild stenosis in the basilar.  There is irregularity of the left posterior cerebral artery.  Atherosclerotic irregularity in the middle cerebral artery branches bilaterally and in the right A1 segment.   Original Report Authenticated By: Janeece Riggers, M.D.   Mr Mra Head/brain Wo Cm  07/24/2013   *RADIOLOGY REPORT*  Clinical Data:  TIA.  Hypertension  MRI HEAD WITHOUT CONTRAST MRA HEAD WITHOUT CONTRAST  Technique:  Multiplanar, multiecho pulse sequences of the brain and  surrounding structures were obtained without intravenous contrast. Angiographic images of the head were obtained using MRA technique without contrast.  Comparison:  CT head 07/24/2013  MRI HEAD  Findings:  Small area acute infarction left external capsule.  No other acute infarct.  Moderate to advanced cortical atrophy.  Chronic microvascular ischemic changes in the white matter, left greater than right. Chronic ischemic change in the right internal capsule  Negative for intracranial hemorrhage.  Negative for mass lesion. No midline shift.  Ventricle size is appropriate for the level of atrophy.  IMPRESSION: Acute infarct left external capsule  Moderate to advanced atrophy.  Chronic microvascular ischemic change in the white matter, left greater than right.  MRA HEAD  Findings: Image quality degraded by motion  The right vertebral artery is patent to the basilar.  The left vertebral artery is nondominant but appears to abruptly change caliber at the level the skull base suggestive of atherosclerotic disease.  There is a small patent lumen extending to the basilar. The basilar is irregular with atherosclerotic disease.  Posterior cerebral arteries are patent bilaterally with mild atherosclerotic disease on the left.  PICA and superior cerebellar arteries are patent but appear to be disease.  Internal carotid artery is patent bilaterally with mild atherosclerotic disease in the cavernous segment.  Mild stenosis right A1 segment.  Moderate stenosis of the right MCA branches.  Left anterior cerebral artery is patent.  Left M1 segment is patent.  Mild atherosclerotic disease in the left middle cerebral artery branches  IMPRESSION: Intracranial atherosclerotic disease.  There is stenosis in the distal left vertebral artery.  Mild stenosis in the basilar.  There is irregularity of the left posterior cerebral artery.  Atherosclerotic irregularity in the middle cerebral artery branches bilaterally and in the right A1  segment.   Original Report Authenticated By: Janeece Riggers, M.D.    Scheduled Meds: . amLODipine  10 mg Oral Daily  . aspirin  325 mg Oral Daily  . atenolol  50 mg Oral Daily  . cefTRIAXone (ROCEPHIN)  IV  1 g Intravenous Q24H  . heparin  5,000 Units Subcutaneous Q8H   Continuous Infusions: . sodium chloride 75 mL/hr at 07/26/13 9604    Principal Problem:   CVA (cerebral infarction) Active Problems:   HYPERTENSION   UTI (urinary tract infection)   Chronic kidney disease- stage IV    Time spent: 35    [facility], Yuuki Skeens  [facility] Pager [phone]. If 7PM-7AM, please contact night-coverage at [email or web address], password [facility] 07/26/2013, 8:59 AM  LOS: 3 days

## 2013-07-26 NOTE — Progress Notes (Signed)
Met w/ pt's niece, Merita Norton [phone] & cell [phone]] to discuss d/c planning & CIR vs SNF.  Niece says family believe SNF will be the best plan.  Informed pt's CM, Courtney.  CIR will sign off.  [phone]

## 2013-07-26 NOTE — Progress Notes (Signed)
Stroke Team Progress Note  HISTORY Lindsay Shannon is an 79 y.o. female who reports that for the past month she has been having trouble with weakness and feeling that she was having difficulty with her right leg. She was visited on yesterday and was at her baseline. Speech was at basline when she was spoken to today around midday as well. When she as visited this afternoon they noted that her speech was not normal and felt that her face was drooping. She was brought in for evaluation at that time. Patient was not a TPA candidate secondary to delay in arrival. She was admitted for further evaluation and treatment.  SUBJECTIVE Lying in bed.  No new symptoms. Plans are for rehab after stay.  OBJECTIVE Most recent Vital Signs: Filed Vitals:   07/25/13 1757 07/26/13 0535 07/26/13 1137 07/26/13 1410  BP: 145/70 180/76 154/70 125/61  Pulse: 59 66 61 68  Temp: 98.5 F (36.9 C) 97.5 F (36.4 C) 97.8 F (36.6 C) 97.9 F (36.6 C)  TempSrc: Oral Axillary Oral Oral  Resp: 16 18 20 20   Height:      Weight:      SpO2: 100% 100% 100% 98%   CBG (last 3)   Recent Labs  07/24/13 0003 07/24/13 1155 07/24/13 1633  GLUCAP 87 115* 155*    IV Fluid Intake:   . sodium chloride 75 mL/hr at 07/26/13 0724    MEDICATIONS   . amLODipine  10 mg Oral Daily  . aspirin  325 mg Oral Daily  . atenolol  50 mg Oral Daily  . heparin  5,000 Units Subcutaneous Q8H   PRN:  hydrALAZINE  Diet:  Dysphagia thin liquids Activity:   Bathroom privileges with assistance DVT Prophylaxis:  Heparin 5000 units sq tid   CLINICALLY SIGNIFICANT STUDIES Basic Metabolic Panel:   Recent Labs Lab 07/25/13 0440 07/26/13 0515  NA 142 136  K 4.6 4.3  CL 110 109  CO2 20 14*  GLUCOSE 85 71  BUN 29* 27*  CREATININE 2.86* 2.52*  CALCIUM 8.8 8.6   Liver Function Tests:   Recent Labs Lab 07/23/13 2250  AST 16  ALT 6  ALKPHOS 102  BILITOT 0.2*  PROT 7.8  ALBUMIN 3.2*   CBC:   Recent Labs Lab 07/24/13 1115  07/26/13 0515  WBC 6.8 5.4  HGB 10.8* 11.1*  HCT 32.8* 33.2*  MCV 91.6 91.7  PLT 222 186   Coagulation: No results found for this basename: LABPROT, INR,  in the last 168 hours Cardiac Enzymes:   Recent Labs Lab 07/23/13 2250  CKTOTAL 110   Urinalysis:   Recent Labs Lab 07/24/13 0701 07/24/13 2309  COLORURINE YELLOW YELLOW  LABSPEC 1.011 1.010  PHURINE 5.0 5.5  GLUCOSEU NEGATIVE NEGATIVE  HGBUR NEGATIVE NEGATIVE  BILIRUBINUR NEGATIVE NEGATIVE  KETONESUR NEGATIVE NEGATIVE  PROTEINUR 30* NEGATIVE  UROBILINOGEN 0.2 0.2  NITRITE NEGATIVE NEGATIVE  LEUKOCYTESUR LARGE* SMALL*   Lipid Panel     Component Value Date/Time   CHOL 155 07/25/2013 0440   TRIG 57 07/25/2013 0440   HDL 55 07/25/2013 0440   CHOLHDL 2.8 07/25/2013 0440   VLDL 11 07/25/2013 0440   LDLCALC 89 07/25/2013 0440   HgbA1C  Lab Results  Component Value Date   HGBA1C 5.6 07/24/2013   Urine Drug Screen:      Component Value Date/Time   LABOPIA NONE DETECTED 07/24/2013 2309    Alcohol Level: No results found for this basename: ETH,  in the last  168 hours   CT of the brain  07/24/2013  1. No evidence of acute intracranial disease. 2. Right frontal hygroma, mildly flattening the right frontal lobe. 3. Diffuse brain atrophy.  Chronic small vessel ischemia.    MRI of the brain  07/24/2013    Acute infarct left external capsule  Moderate to advanced atrophy.  Chronic microvascular ischemic change in the white matter, left greater than right.  MRA of the brain  07/24/2013   Intracranial atherosclerotic disease.  There is stenosis in the distal left vertebral artery.  Mild stenosis in the basilar.  There is irregularity of the left posterior cerebral artery.  Atherosclerotic irregularity in the middle cerebral artery branches bilaterally and in the right A1 segment.   2D Echocardiogram  EF 60%, wall motion was normal, no cardiac source of embolism identified.  Carotid Doppler  Preliminary report: Bilateral: 1-39%  ICA stenosis. Vertebral artery flow is antegrade. Technically difficult due to severe tortuosity bilaterally.   CXR    EKG  normal sinus rhythm, RBBB, 1st degree AV block.   Therapy Recommendations CIR  Physical Exam   GENERAL EXAM: Patient is in no distress  CARDIOVASCULAR: Regular rate and rhythm, no murmurs, no carotid bruits  NEUROLOGIC: MENTAL STATUS: awake, alert, language MILD APHASIA, comprehension intact, naming intact CRANIAL NERVE: pupils equal and reactive to light, visual fields full to confrontation, extraocular muscles intact, no nystagmus, facial sensation and strength symmetric, uvula midline, shoulder shrug symmetric, tongue midline. MOTOR: RUE 2, RLE 2, LEFT SIDE FULL STRENGTH. SENSORY: normal and symmetric to light touch GAIT/STATION: SITTING AT BEDSIDE.   ASSESSMENT Ms. Lindsay Shannon is a 79 y.o. female presenting with Difficulty with speech and facial droop.  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 Infarct most likely thrombotic secondary to small vessel disease, though workup is pending.  On no antithrombotics prior to admission. Now on aspirin 325 mg orally every day for secondary stroke prevention. Patient with resultant mild right hemiparesis, ataxic gait, mild expressive aphasia and dysarthria. Work up underway. CIR recommended.  Hypertension  Abnormal UA, urine culture=no growth hgb a1c, 5.6 LDL 81, goal < 100, therefore will not need to add statin therapy, not on at home. Chronic kidney disease, stage IV  Hospital day # 3  TREATMENT/PLAN  Continue aspirin 325 mg orally every day for secondary stroke prevention.  Risk factor modification  CIR, stable from neurological standpoint  Follow up with Dr. Pearlean Brownie in 2 months   Lindsay Shannon. Manson Passey, [facility], MBA, MHA [facility] Pager: [phone] 07/26/2013 4:26 PM  I have personally examined this patient, reviewed pertinent data, and about plan of care and agree with the  above.  Delia Heady, MD

## 2013-07-26 NOTE — Clinical Social Work Placement (Signed)
Clinical Social Work Department CLINICAL SOCIAL WORK PLACEMENT NOTE 07/26/2013  Patient:  GENESE, QUEBEDEAUX  Account Number:  [id number] Admit date:  07/23/2013  Clinical Social Worker:  Read Drivers  Date/time:  07/26/2013 04:35 PM  Clinical Social Work is seeking post-discharge placement for this patient at the following level of care:   SKILLED NURSING   (*CSW will update this form in Epic as items are completed)   07/26/2013  Patient/family provided with [facility] Department of Clinical Social Work's list of facilities offering this level of care within the geographic area requested by the patient (or if unable, by the patient's family).  07/26/2013  Patient/family informed of their freedom to choose among providers that offer the needed level of care, that participate in Medicare, Medicaid or managed care program needed by the patient, have an available bed and are willing to accept the patient.  07/26/2013  Patient/family informed of [facility]' ownership interest in [facility], as well as of the fact that they are under no obligation to receive care at this facility.  PASARR submitted to EDS on 07/26/2013 PASARR number received from [employer] on 07/26/2013  FL2 transmitted to all facilities in geographic area requested by pt/family on  07/26/2013 FL2 transmitted to all facilities within larger geographic area on   Patient informed that his/her managed care company has contracts with or will negotiate with  certain facilities, including the following:     Patient/family informed of bed offers received:   Patient chooses bed at  Physician recommends and patient chooses bed at    Patient to be transferred to  on   Patient to be transferred to facility by   The following physician request were entered in Epic:   Additional Comments:  Vickii Penna, LCSWA [phone]  Clinical Social Work

## 2013-07-26 NOTE — Progress Notes (Signed)
Physical Therapy Treatment Patient Details Name: Lindsay Shannon MRN: [id number] DOB: 1923/11/01 Today's Date: 07/26/2013 Time: 7829-5621 PT Time Calculation (min): 21 min  PT Assessment / Plan / Recommendation  History of Present Illness 79 year old female who states she's been having a headache for a "good while". She went to develop dizziness and staggering and fell today. Friends found pt with slurred speech and brought her to ED. Pt with hx of Hip and knee surg in past MRI (+) Acute infarct left external capsule   PT Comments   Pt progressed well with mobility today.  I decided not to try PF RW due to decreased right shoulder girdle stability and increased pt impulsivity and decreased awareness today.  I think the RW would get in the way more right now than be helpful.  Pt was able to walk into the hallway with two person assist.  I continue to recommend inpatient rehab at discharge.   Follow Up Recommendations  CIR     Does the patient have the potential to tolerate intense rehabilitation    Yes  Barriers to Discharge   Questionable caregiver support at discharge.  There has been many family members in and out of the room.        Equipment Recommendations  Wheelchair (measurements PT);Wheelchair cushion (measurements PT);Other (comment)    Recommendations for Other Services Rehab consult  Frequency Min 4X/week   Progress towards PT Goals Progress towards PT goals: Progressing toward goals  Plan Current plan remains appropriate    Precautions / Restrictions Precautions Precautions: Fall Precaution Comments: impulsive, decreased awareness of deficits, right sided inattention and left gaze preference.     Pertinent Vitals/Pain See vitals flow sheet.     Mobility  Bed Mobility Supine to Sit: 3: Mod assist Sitting - Scoot to Edge of Bed: 3: Mod assist Details for Bed Mobility Assistance: mod assist to give support to trunk while pt pulls up with left arm.  Mod assist to help pt  stabilize trunk for balance while she attempts to weight shift to scoot.  Pt moves very quickly.   Transfers Transfers: Sit to Stand;Stand to Sit Sit to Stand: 1: +2 Total assist;With upper extremity assist;From bed Sit to Stand: Patient Percentage: 70% Stand to Sit: With upper extremity assist;With armrests;To chair/3-in-1;1: +2 Total assist Stand to Sit: Patient Percentage: 60% Details for Transfer Assistance: pt provided with hand held assist for balance and stability on left arm and manual assist to place and stabilize right knee during transitions.   Ambulation/Gait Ambulation/Gait Assistance: 1: +2 Total assist Ambulation/Gait: Patient Percentage: 60% Ambulation Distance (Feet): 45 Feet Assistive device: 2 person hand held assist Ambulation/Gait Assistance Details: stability with hand held assist on left upper extremity and second person to help stabilize and progress right leg during gait.  Max verbal cues to attend to task, for LE sequencing and for upright posture.   Gait Pattern: Step-to pattern;Decreased step length - right;Decreased stance time - right;Decreased hip/knee flexion - right;Decreased dorsiflexion - right;Decreased weight shift to left;Trunk flexed Gait velocity: less than 1.8 ft/sec indicating risk for recurrent falls General Gait Details: pt kept asking therapists, "let me go, I can do it on my own.  Quit holding me back." Modified Rankin (Stroke Patients Only) Modified Rankin: Severe disability      PT Goals (current goals can now be found in the care plan section) Acute Rehab PT Goals Patient Stated Goal: to walk on her own  Visit Information  Last PT Received On:  07/26/13 Assistance Needed: +2 (for gait) History of Present Illness: 79 year old female who states she's been having a headache for a "good while". She went to develop dizziness and staggering and fell today. Friends found pt with slurred speech and brought her to ED. Pt with hx of Hip and knee surg  in past MRI (+) Acute infarct left external capsule    Subjective Data  Subjective: Pt seems more confused today.  She is oriented to person.   Patient Stated Goal: to walk on her own   Cognition  Cognition Arousal/Alertness: Awake/alert Behavior During Therapy: Impulsive Overall Cognitive Status: Impaired/Different from baseline Area of Impairment: Orientation;Memory;Following commands;Safety/judgement;Awareness;Attention Orientation Level: Disoriented to;Time;Situation Current Attention Level: Sustained Memory: Decreased short-term memory Following Commands: Follows one step commands with increased time Safety/Judgement: Decreased awareness of safety;Decreased awareness of deficits Awareness: Intellectual (decreased awareness today) General Comments: Pt with decreased awareness of deficits today.  She thinks she can walk down the hallway on her own.      Balance  Static Sitting Balance Static Sitting - Balance Support: Left upper extremity supported;Feet supported Static Sitting - Level of Assistance: 4: Min assist Static Sitting - Comment/# of Minutes: min assist due to LOB posteriorly when she removes her left hand from the bed today Static Standing Balance Static Standing - Balance Support: Bilateral upper extremity supported Static Standing - Level of Assistance: 1: +2 Total assist;Patient percentage (comment) (pt 70%) Dynamic Standing Balance Dynamic Standing - Balance Support: Left upper extremity supported Dynamic Standing - Level of Assistance: 1: +2 Total assist;Patient percentage (comment) (pt 60-70%)  End of Session PT - End of Session Equipment Utilized During Treatment: Gait belt Activity Tolerance: Other (comment) (pt limited by cognition) Patient left: in chair;with call bell/phone within reach;with family/visitor present     Lurena Joiner B. Alexius Hangartner, PT, DPT [phone]   07/26/2013, 3:18 PM

## 2013-07-26 NOTE — Progress Notes (Addendum)
SLP Cancellation Note  Patient Details Name: Lindsay Shannon MRN: [id number] DOB: 05/08/1923   Cancelled treatment:       Reason Eval/Treat Not Completed:  (pt being assisted to eat lunch currently and needing to use bedside commode, will reattempt later)  Donavan Burnet, MS [facility] SLP [phone]   Chales Abrahams 07/26/2013, 11:59 AM

## 2013-07-26 NOTE — Clinical Social Work Note (Signed)
CSW received notification from [facility] and [facility] that pt denied CIR.  Pt and family requesting SNF.  CSW conducted SNF search.  Full assessment to be completed by unit CSW and will follow.    Vickii Penna, LCSWA [phone]  Clinical Social Work

## 2013-07-27 MED ORDER — AMLODIPINE BESYLATE 10 MG PO TABS: 10.0000 mg | ORAL_TABLET | Freq: Every day | ORAL | Status: DC

## 2013-07-27 MED ORDER — ASPIRIN 325 MG PO TABS: 325.0000 mg | ORAL_TABLET | Freq: Every day | ORAL | Status: DC

## 2013-07-27 NOTE — Discharge Summary (Signed)
Physician Discharge Summary  Lindsay Shannon ZOX:[id number] DOB: 1923-04-14 DOA: 07/23/2013  PCP: No primary provider on file.  Admit date: 07/23/2013 Discharge date: 07/27/2013  Time spent: 35 minutes  Recommendations for Outpatient Follow-up:  Continued speech, PT/OT at SNF  Discharge Diagnoses:  Principal Problem:   CVA (cerebral infarction) Active Problems:   HYPERTENSION   UTI (urinary tract infection)   Chronic kidney disease- stage IV   Discharge Condition: improved  Diet recommendation: DYS 3 while does not have teeth- SLP to follow at [facility]  [facility] Weights   07/23/13 2143 07/24/13 0949  Weight: 48.081 kg (106 lb) 49.2 kg (108 lb 7.5 oz)    History of present illness:  History provided by ER physician, no one at bedside to assist with providing history. This is a pleasant 79 year old female who states she's been having a headache for a "good while". She went to develop dizziness and staggering and fell today. She states she did not hit her head nor had any loss of consciousness. She denies any nausea or vomiting. Friends visited today and found the patient with slurred speech and brought her to the ER. Here the ER the patient's blood pressure is quite elevated, she states this is chronic for her, stating is occasionally in the 200s..She does have chronic kidney disease. She states she's not urinated since she's been in the ER. During my interview the patient does continued to slur her speech.    Hospital Course:  CVA:  - MRI positive for acute infarct in the left external capsule, moderate to advanced atrophy  - MRA showed intracranial atherosclerotic ds, stenosis of the distal left vertebral artery.  2-D echo: Left ventricle: The cavity size was normal. Wall thickness was increased in a pattern of mild LVH. The estimated ejection fraction was 60%. Wall motion was normal; there were no regional wall motion abnormalities. - Aortic valve: Sclerosis without stenosis.  Mild regurgitation. - Impressions: No cardiac source of embolism was identified, but cannot be ruled out on the basis of this examination  -carotid Dopplers: Bilateral: 1-39% ICA stenosis  -bedside swallow evaluation- DYS3- not a big eater per family at baseline  -PT/OT rec CIR but suspect with her dementia, she may need SNF- will defer to rehab dr  - Continue aspirin  - Lipid panel LDL: 89, hemoglobin A1c ok  Neurology following   Urinary tract infection?: Urinary with positive leukocytes, WBC 7-10, rare bacteria, F/u urine cultures negative, d/c rocephin   CKD- stage IV- baseline about 3.5   Procedures:  Echo: see above   Consultations:  neuro  Discharge Exam: Filed Vitals:   07/27/13 0603  BP: 163/79  Pulse: 75  Temp: 98.3 F (36.8 C)  Resp: 18    General: pleasant/cooperative Cardiovascular:rrr Respiratory: clear anterior  Discharge Instructions      Discharge Orders   Future Appointments Provider Department Dept Phone   01/09/2014 1:00 PM Delcie Roch [facility] [phone]   01/09/2014 1:30 PM Benjiman Core, MD [place] [facility] [phone]   Future Orders Complete By Expires   Diet - low sodium heart healthy  As directed    Increase activity slowly  As directed        Medication List         amLODipine 10 MG tablet  Commonly known as:  NORVASC  Take 1 tablet (10 mg total) by mouth daily.     aspirin 325 MG tablet  Take 1  tablet (325 mg total) by mouth daily.     atenolol 50 MG tablet  Commonly known as:  TENORMIN  Take 50 mg by mouth daily.       No Known Allergies Follow-up Information   Please follow up. (PCP by [employer], patient unsure of name)        The results of significant diagnostics from this hospitalization (including imaging, microbiology, ancillary and laboratory) are listed below for reference.    Significant Diagnostic Studies: Ct Head Wo Contrast  07/25/2013    *RADIOLOGY REPORT*  Clinical Data: New onset right-sided weakness.  CT HEAD WITHOUT CONTRAST  Technique:  Contiguous axial images were obtained from the base of the skull through the vertex without contrast.  Comparison: MRI of the brain July 24, 2013 at 0838 hours.  Findings: Lentiform hypodensity in the left external capsule, as described on prior MRI without hemorrhagic conversion.  Additional scattered focal hypodensities most consistent with moderate white matter changes, better seen on prior MRI.  No acute large vascular territory infarct, midline shift or mass effect.  Moderate ventriculomegaly, likely on the bases of global parenchymal brain volume loss.  Right anterior cranial fossa and 9 mm low density fluid collection may reflect remote subdural hematoma or hygroma and results in mild effacement the subjacent sulci.  Basal cisterns are patent. Moderate to severe calcific atherosclerosis of the carotid siphons.  Status post bilateral ocular lens implants.  Degenerative change of the included cervical spine.  Moderate temporomandibular osteoarthrosis.  No paranasal sinus air-fluid levels, mastoid air cells appear well aerated.  No skull fracture.  IMPRESSION: Evolving left external capsule lacunar infarct, seen to better detail on MR of the brain from same date, reported separately.  No hemorrhagic conversion.  Stable appearance of brain:  Involutional changes and  moderate white matter changes suggestive of chronic small vessel ischemic disease.   Original Report Authenticated By: Awilda Metro   Ct Head Wo Contrast  07/24/2013   CLINICAL DATA:  Aphasia. Fall.  EXAM: CT HEAD WITHOUT CONTRAST  TECHNIQUE: Contiguous axial images were obtained from the base of the skull through the vertex without intravenous contrast.  COMPARISON:  None.  FINDINGS: Skull:Parietal lucency, noted on recent skull radiography,is not changed in the interim. CT does not add further specificity.  Orbits: Bilateral  cataract resection.  Brain: CSF density collection in the right frontal region. There appears to be a vessel, or possibly a septation, centrally. This space measures 14 mm in maximal thickness, and mildly flattens the right frontal pole.  No evidence of acute hemorrhage, hydrocephalus, large territory infarct, or mass lesion. There is global cerebral atrophy and patchy bilateral cerebral white matter low-attenuation. Extensive intracranial calcified atherosclerosis.  IMPRESSION: 1. No evidence of acute intracranial disease. 2. Right frontal hygroma, mildly flattening the right frontal lobe. 3. Diffuse brain atrophy.  Chronic small vessel ischemia.   Electronically Signed   By: Tiburcio Pea   On: 07/24/2013 02:55   Mri Brain Without Contrast  07/24/2013   *RADIOLOGY REPORT*  Clinical Data:  TIA.  Hypertension  MRI HEAD WITHOUT CONTRAST MRA HEAD WITHOUT CONTRAST  Technique:  Multiplanar, multiecho pulse sequences of the brain and surrounding structures were obtained without intravenous contrast. Angiographic images of the head were obtained using MRA technique without contrast.  Comparison:  CT head 07/24/2013  MRI HEAD  Findings:  Small area acute infarction left external capsule.  No other acute infarct.  Moderate to advanced cortical atrophy.  Chronic microvascular ischemic changes in the  white matter, left greater than right. Chronic ischemic change in the right internal capsule  Negative for intracranial hemorrhage.  Negative for mass lesion. No midline shift.  Ventricle size is appropriate for the level of atrophy.  IMPRESSION: Acute infarct left external capsule  Moderate to advanced atrophy.  Chronic microvascular ischemic change in the white matter, left greater than right.  MRA HEAD  Findings: Image quality degraded by motion  The right vertebral artery is patent to the basilar.  The left vertebral artery is nondominant but appears to abruptly change caliber at the level the skull base suggestive of  atherosclerotic disease.  There is a small patent lumen extending to the basilar. The basilar is irregular with atherosclerotic disease.  Posterior cerebral arteries are patent bilaterally with mild atherosclerotic disease on the left.  PICA and superior cerebellar arteries are patent but appear to be disease.  Internal carotid artery is patent bilaterally with mild atherosclerotic disease in the cavernous segment.  Mild stenosis right A1 segment.  Moderate stenosis of the right MCA branches.  Left anterior cerebral artery is patent.  Left M1 segment is patent.  Mild atherosclerotic disease in the left middle cerebral artery branches  IMPRESSION: Intracranial atherosclerotic disease.  There is stenosis in the distal left vertebral artery.  Mild stenosis in the basilar.  There is irregularity of the left posterior cerebral artery.  Atherosclerotic irregularity in the middle cerebral artery branches bilaterally and in the right A1 segment.   Original Report Authenticated By: Janeece Riggers, M.D.   Mr Mra Head/brain Wo Cm  07/24/2013   *RADIOLOGY REPORT*  Clinical Data:  TIA.  Hypertension  MRI HEAD WITHOUT CONTRAST MRA HEAD WITHOUT CONTRAST  Technique:  Multiplanar, multiecho pulse sequences of the brain and surrounding structures were obtained without intravenous contrast. Angiographic images of the head were obtained using MRA technique without contrast.  Comparison:  CT head 07/24/2013  MRI HEAD  Findings:  Small area acute infarction left external capsule.  No other acute infarct.  Moderate to advanced cortical atrophy.  Chronic microvascular ischemic changes in the white matter, left greater than right. Chronic ischemic change in the right internal capsule  Negative for intracranial hemorrhage.  Negative for mass lesion. No midline shift.  Ventricle size is appropriate for the level of atrophy.  IMPRESSION: Acute infarct left external capsule  Moderate to advanced atrophy.  Chronic microvascular ischemic change  in the white matter, left greater than right.  MRA HEAD  Findings: Image quality degraded by motion  The right vertebral artery is patent to the basilar.  The left vertebral artery is nondominant but appears to abruptly change caliber at the level the skull base suggestive of atherosclerotic disease.  There is a small patent lumen extending to the basilar. The basilar is irregular with atherosclerotic disease.  Posterior cerebral arteries are patent bilaterally with mild atherosclerotic disease on the left.  PICA and superior cerebellar arteries are patent but appear to be disease.  Internal carotid artery is patent bilaterally with mild atherosclerotic disease in the cavernous segment.  Mild stenosis right A1 segment.  Moderate stenosis of the right MCA branches.  Left anterior cerebral artery is patent.  Left M1 segment is patent.  Mild atherosclerotic disease in the left middle cerebral artery branches  IMPRESSION: Intracranial atherosclerotic disease.  There is stenosis in the distal left vertebral artery.  Mild stenosis in the basilar.  There is irregularity of the left posterior cerebral artery.  Atherosclerotic irregularity in the middle cerebral artery branches  bilaterally and in the right A1 segment.   Original Report Authenticated By: Janeece Riggers, M.D.    Microbiology: Recent Results (from the past 240 hour(s))  URINE CULTURE     Status: None   Collection Time    07/24/13 11:09 PM      Result Value Range Status   Specimen Description URINE, RANDOM   Final   Special Requests NONE   Final   Culture  Setup Time     Final   Value: 07/25/2013 00:43     Performed at [facility] Count     Final   Value: NO GROWTH     Performed at [facility]   Culture     Final   Value: NO GROWTH     Performed at [facility]   Report Status 07/26/2013 FINAL   Final     Labs: Basic Metabolic Panel:  Recent Labs Lab 07/23/13 2250 07/24/13 1115 07/25/13 0440  07/26/13 0515  NA 144  --  142 136  K 4.0  --  4.6 4.3  CL 113*  --  110 109  CO2 20  --  20 14*  GLUCOSE 98  --  85 71  BUN 31*  --  29* 27*  CREATININE 3.12* 2.64* 2.86* 2.52*  CALCIUM 9.2  --  8.8 8.6   Liver Function Tests:  Recent Labs Lab 07/23/13 2250  AST 16  ALT 6  ALKPHOS 102  BILITOT 0.2*  PROT 7.8  ALBUMIN 3.2*   No results found for this basename: LIPASE, AMYLASE,  in the last 168 hours No results found for this basename: AMMONIA,  in the last 168 hours CBC:  Recent Labs Lab 07/23/13 2250 07/24/13 1115 07/26/13 0515  WBC 6.4 6.8 5.4  HGB 9.6* 10.8* 11.1*  HCT 28.7* 32.8* 33.2*  MCV 90.8 91.6 91.7  PLT 233 222 186   Cardiac Enzymes:  Recent Labs Lab 07/23/13 2250  CKTOTAL 110   BNP: BNP (last 3 results) No results found for this basename: PROBNP,  in the last 8760 hours CBG:  Recent Labs Lab 07/24/13 0003 07/24/13 1155 07/24/13 1633  GLUCAP 87 115* 155*       Signed:  VANN, JESSICA  [facility] 07/27/2013, 9:04 AM

## 2013-07-27 NOTE — Plan of Care (Signed)
Problem: Consults Goal: Ischemic Stroke Patient Education See Patient Education Module for education specifics.  Outcome: Completed/Met Date Met:  07/27/13 Stroke information given to family members prior to discharged

## 2013-07-27 NOTE — Progress Notes (Signed)
Physical Therapy Treatment Patient Details Name: Lindsay Shannon MRN: [id number] DOB: February 10, 1923 Today's Date: 07/27/2013 Time: 7829-5621 PT Time Calculation (min): 23 min  PT Assessment / Plan / Recommendation  History of Present Illness 79 year old female who states she's been having a headache for a "good while". She went to develop dizziness and staggering and fell today. Friends found pt with slurred speech and brought her to ED. Pt with hx of Hip and knee surg in past MRI (+) Acute infarct left external capsule   PT Comments   Pt is progressing well with mobility. Less buckling noted in right leg today.  She is still very impulsive and cannot tell me that her right leg is weak.  She continues to want me to let go and let her walk to the other side of the room by herself.  Despite progression, she continues to be a high fall risk  Follow Up Recommendations  CIR     Does the patient have the potential to tolerate intense rehabilitation    Yes  Barriers to Discharge   lives alone normally      Equipment Recommendations  Wheelchair (measurements PT);Wheelchair cushion (measurements PT);Other (comment)    Recommendations for Other Services   NA  Frequency Min 4X/week   Progress towards PT Goals Progress towards PT goals: Progressing toward goals  Plan Current plan remains appropriate    Precautions / Restrictions Precautions Precautions: Fall Precaution Comments: impulsive, decreased awareness of deficits, right sided inattention and left gaze preference.     Pertinent Vitals/Pain See vitals flow sheet.    Mobility  Bed Mobility Supine to Sit: 3: Mod assist Sitting - Scoot to Edge of Bed: 3: Mod assist Details for Bed Mobility Assistance: mod assist to support trunk for balance while attempting to get EOB and pull herself to the side of the bed.  Every time she would go to scoot she would lose her balance posteriorly.   Transfers Transfers: Stand to Sit;Sit to Stand Sit to  Stand: 3: Mod assist;From elevated surface;With upper extremity assist Stand to Sit: 3: Mod assist;With upper extremity assist;With armrests;To chair/3-in-1 Stand Pivot Transfers: 3: Mod assist;From elevated surface;With armrests Details for Transfer Assistance: mod assist to support trunk for balance and block right leg.   Ambulation/Gait Ambulation/Gait Assistance: 1: +2 Total assist Ambulation/Gait: Patient Percentage: 70% Ambulation Distance (Feet): 30 Feet (15'x2) Assistive device: 2 person hand held assist;Other (Comment) (one person left hand and one person at leg) Gait Pattern: Step-to pattern;Decreased hip/knee flexion - right;Decreased dorsiflexion - right;Decreased stance time - right;Right flexed knee in stance;Trunk flexed General Gait Details: pt with less buckling noted with WB on right leg during stance phase of gait.  She still has decreased awareness of deficits and could not understand why we would not let her walk to the other side of the room by herself.  She could not tell me that her right leg was weak.   Modified Rankin (Stroke Patients Only) Modified Rankin: Severe disability      PT Goals (current goals can now be found in the care plan section) Acute Rehab PT Goals Patient Stated Goal: to walk on her own  Visit Information  Last PT Received On: 07/27/13 Assistance Needed: +2 (for safety with gait) History of Present Illness: 79 year old female who states she's been having a headache for a "good while". She went to develop dizziness and staggering and fell today. Friends found pt with slurred speech and brought her to ED. Pt with hx  of Hip and knee surg in past MRI (+) Acute infarct left external capsule    Subjective Data  Subjective: Pt lively and joking with family who was visiting today.   Patient Stated Goal: to walk on her own   Cognition  Cognition Arousal/Alertness: Awake/alert Behavior During Therapy: Impulsive Overall Cognitive Status:  Impaired/Different from baseline Area of Impairment: Orientation;Memory;Following commands;Safety/judgement;Awareness;Attention Orientation Level: Disoriented to;Time;Situation Current Attention Level: Sustained Memory: Decreased short-term memory Following Commands: Follows one step commands with increased time Safety/Judgement: Decreased awareness of safety;Decreased awareness of deficits Awareness: Intellectual General Comments: Pt with decreased awareness of deficits today.  She thinks she can walk down the hallway on her own.      Balance  Static Sitting Balance Static Sitting - Balance Support: Left upper extremity supported;Feet supported Static Sitting - Level of Assistance: 4: Min assist Dynamic Sitting Balance Dynamic Sitting - Balance Support: Left upper extremity supported Dynamic Sitting - Level of Assistance: 3: Mod assist Static Standing Balance Static Standing - Balance Support: Left upper extremity supported Static Standing - Level of Assistance: 3: Mod assist Dynamic Standing Balance Dynamic Standing - Balance Support: Left upper extremity supported Dynamic Standing - Level of Assistance: 1: +2 Total assist;Patient percentage (comment) (pt 70%)  End of Session PT - End of Session Equipment Utilized During Treatment: Gait belt Activity Tolerance: Patient tolerated treatment well Patient left: in chair;in CPM;with family/visitor present       Lurena Joiner B. Kyrene Longan, PT, DPT [phone]   07/27/2013, 3:02 PM

## 2013-07-27 NOTE — Clinical Social Work Psychosocial (Signed)
     Clinical Social Work Department BRIEF PSYCHOSOCIAL ASSESSMENT 07/27/2013  Patient:  PHEOBE, SANDIFORD     Account Number:  [id number]     Admit date:  07/23/2013  Clinical Social Worker:  Gretta Cool, Chiropodist CSW  Date/Time:  07/27/2013 12:00 N  Referred by:  Care Management  Date Referred:  07/26/2013 Referred for  SNF Placement   Other Referral:   Interview type:  Family Other interview type:    PSYCHOSOCIAL DATA Living Status:  ALONE Admitted from facility:   Level of care:   Primary support name:  Tarri Fuller Primary support relationship to patient:  FRIEND Degree of support available:   Ms. Koren has very good support with a financial POA, Tarri Fuller [phone]  ), as well as a health POA, Edgardo Roys [phone]) This information was reported to me by Mr. Alvester Morin.    CURRENT CONCERNS Current Concerns  Post-Acute Placement   Other Concerns:    SOCIAL WORK ASSESSMENT / PLAN After speaking with Ms. Ortner and Mr. Alvester Morin an agreement was made to pursue snk placement in [facility]. We hope to have bed offers to share with the family soon. Our floor CSW will follow.   Assessment/plan status:  Information/Referral to [facility] Other assessment/ plan:   Information/referral to community resources:   skilled nursing bed list    PATIENTS/FAMILYS RESPONSE TO PLAN OF CARE: Mr. Alvester Morin feels skilled nursing is appropriate and is appreciative of our involvement. Ms. Nish prefers to go home, but is open to a bed search for skilled care options.

## 2013-07-27 NOTE — Progress Notes (Signed)
Physical Therapy Treatment Patient Details Name: Lindsay Shannon MRN: [id number] DOB: 1922/12/23 Today's Date: 07/27/2013 Time: 4098-1191 PT Time Calculation (min): 17 min  PT Assessment / Plan / Recommendation  History of Present Illness 79 year old female who states she's been having a headache for a "good while". She went to develop dizziness and staggering and fell today. Friends found pt with slurred speech and brought her to ED. Pt with hx of Hip and knee surg in past MRI (+) Acute infarct left external capsule   PT Comments   Called back into the room due to pt urgently had to go to the bathroom.  If tray table were not in her way, I believe she would be on her feet trying to get to the bathroom.  Unable to preform her own pericare due to heavy reliance on left arm for balance.    Follow Up Recommendations  CIR     Does the patient have the potential to tolerate intense rehabilitation    Yes  Barriers to Discharge   lives alone      Equipment Recommendations  Wheelchair (measurements PT);Wheelchair cushion (measurements PT);Other (comment)    Recommendations for Other Services   none  Frequency Min 4X/week   Progress towards PT Goals Progress towards PT goals: Progressing toward goals  Plan Current plan remains appropriate    Precautions / Restrictions Precautions Precautions: Fall Precaution Comments: impulsive, decreased awareness of deficits, right sided inattention and left gaze preference.     Pertinent Vitals/Pain See vitals flow sheet.     Mobility  Bed Mobility Supine to Sit: 3: Mod assist Sitting - Scoot to Edge of Bed: 3: Mod assist Details for Bed Mobility Assistance: mod assist to support trunk for balance while attempting to get EOB and pull herself to the side of the bed.  Every time she would go to scoot she would lose her balance posteriorly.   Transfers Transfers: Stand to Sit;Sit to Stand Sit to Stand: 3: Mod assist Stand to Sit: 3: Mod assist Stand  Pivot Transfers: 3: Mod assist Details for Transfer Assistance: Mod assist to support trunk and stabilize right knee for balance.  Pt attempting to sit prematurely.   Ambulation/Gait Ambulation/Gait Assistance: 1: +2 Total assist Ambulation/Gait: Patient Percentage: 70% Ambulation Distance (Feet): 30 Feet (15'x2) Assistive device: 2 person hand held assist;Other (Comment) (one person left hand and one person at leg) Gait Pattern: Step-to pattern;Decreased hip/knee flexion - right;Decreased dorsiflexion - right;Decreased stance time - right;Right flexed knee in stance;Trunk flexed General Gait Details: pt with less buckling noted with WB on right leg during stance phase of gait.  She still has decreased awareness of deficits and could not understand why we would not let her walk to the other side of the room by herself.  She could not tell me that her right leg was weak.   Modified Rankin (Stroke Patients Only) Modified Rankin: Severe disability      PT Goals (current goals can now be found in the care plan section) Acute Rehab PT Goals Patient Stated Goal: to walk on her own  Visit Information  Last PT Received On: 07/27/13 Assistance Needed: +2 (for safety wtih gait) History of Present Illness: 79 year old female who states she's been having a headache for a "good while". She went to develop dizziness and staggering and fell today. Friends found pt with slurred speech and brought her to ED. Pt with hx of Hip and knee surg in past MRI (+) Acute infarct left external  capsule    Subjective Data  Subjective: Called back into room to assist pt to [facility].   Patient Stated Goal: to walk on her own   Cognition  Cognition Arousal/Alertness: Awake/alert Behavior During Therapy: Impulsive Overall Cognitive Status: Impaired/Different from baseline Area of Impairment: Orientation;Memory;Following commands;Safety/judgement;Awareness;Attention Orientation Level: Disoriented to;Time;Situation Current  Attention Level: Sustained Memory: Decreased short-term memory Following Commands: Follows one step commands with increased time Safety/Judgement: Decreased awareness of safety;Decreased awareness of deficits Awareness: Intellectual General Comments: Pt with decreased awareness of deficits today.  She thinks she can walk down the hallway on her own.      Balance  Static Sitting Balance Static Sitting - Balance Support: Left upper extremity supported;Feet supported Static Sitting - Level of Assistance: 4: Min assist Dynamic Sitting Balance Dynamic Sitting - Balance Support: Left upper extremity supported Dynamic Sitting - Level of Assistance: 3: Mod assist Static Standing Balance Static Standing - Balance Support: Left upper extremity supported Static Standing - Level of Assistance: 3: Mod assist Static Standing - Comment/# of Minutes: mod assist to support pt's trunk and preform total assist peri care after voiding.   Dynamic Standing Balance Dynamic Standing - Balance Support: Left upper extremity supported Dynamic Standing - Level of Assistance: 1: +2 Total assist;Patient percentage (comment) (pt 70%)  End of Session PT - End of Session Equipment Utilized During Treatment: Gait belt Activity Tolerance: Patient tolerated treatment well Patient left: in chair;with call bell/phone within reach;with family/visitor present     Lurena Joiner B. Tru Rana, PT, DPT [phone]   07/27/2013, 4:26 PM

## 2013-07-27 NOTE — Progress Notes (Signed)
Speech Language Pathology Treatment Patient Details Name: Lindsay Shannon MRN: [id number] DOB: 1923-05-23 Today's Date: 07/27/2013 Time: 4098-1191 SLP Time Calculation (min): 25 min  Assessment / Plan / Recommendation Clinical Impression  SLP visit to address dysarthria goals.  Pt in room with neice and had no complaint of pain.  SLP facilitated oral communication with verbal, visual, tactile cues to slow rate and overarticulate for improved speech clarify.  Pt required total cues at begining of session transitioning to minimal cue by end of session.  Teach back used for pt to recite need to slow rate, overarticulate and tap hand for rate control.  Speech intelligibility for phrase level was approx 65%.  Hopefully pt can recall to use strategies to compensate given her short-term memory difficulties.  SLP offered to post reminder speech sign in room but pt declined.  Two neices present and report they will help pt.    Educated family to also record pt reading sentence on phone and play back to assess intelligibility and for pt to have feeback.  Rec continue skilled SLP, pt is progressing toward her goals and expressed gratitude for today's session.    SLP Plan  Continue with current plan of care    Pertinent Vitals/Pain Afebrile, decreased  SLP Goals  SLP Goals Potential to Achieve Goals: Good Progress/Goals/Alternative treatment plan discussed with pt/caregiver and they: Agree SLP Goal #1: Pt will  identify three methods to assist with speech clarity with no cues. SLP Goal #1 - Progress: Progressing toward goal SLP Goal #2: Pt will implement intelligibility strategies during phrase production with min cues for 70% clarity.  SLP Goal #2 - Progress: Progressing toward goal  General Temperature Spikes Noted: No  Oral Cavity - Oral Hygiene   n/a  Treatment Treatment focused on: Dysarthria Skilled Treatment: verbal, visual, tactile feedback, fuctional tasks, family and pt education    GO     Donavan Burnet, [place] [facility] SLP [phone]

## 2013-07-28 NOTE — Progress Notes (Signed)
Patient discharged to facility via PTAR. Vitals stable. Stroke handouts and instructions given to patient and brother. They both stated understanding of instruction given. Report called in to facility.

## 2013-07-30 ENCOUNTER — Encounter: Payer: Self-pay | Admitting: Internal Medicine

## 2013-07-30 ENCOUNTER — Non-Acute Institutional Stay (SKILLED_NURSING_FACILITY): Payer: Medicare Other | Admitting: Internal Medicine

## 2013-07-30 DIAGNOSIS — I10 Essential (primary) hypertension: Secondary | ICD-10-CM

## 2013-07-30 DIAGNOSIS — I6350 Cerebral infarction due to unspecified occlusion or stenosis of unspecified cerebral artery: Secondary | ICD-10-CM

## 2013-07-30 DIAGNOSIS — N184 Chronic kidney disease, stage 4 (severe): Secondary | ICD-10-CM

## 2013-07-30 DIAGNOSIS — I639 Cerebral infarction, unspecified: Secondary | ICD-10-CM

## 2013-07-30 DIAGNOSIS — I129 Hypertensive chronic kidney disease with stage 1 through stage 4 chronic kidney disease, or unspecified chronic kidney disease: Secondary | ICD-10-CM

## 2013-07-30 NOTE — Assessment & Plan Note (Signed)
Will continue atenolol 50 mg daily and norvasc 10 mg daily

## 2013-07-30 NOTE — Assessment & Plan Note (Signed)
Pt presented with headache for several days, dizzyness, staggering gate and slurred speech and very high BP with a hx of spikes in BP.MRI- + acute infarct in L external capsule; MRA intracranial atherosclerotic dx; ECHO with EF 60%- no found source of emboli; Neuro recommended ASA 325 mg; PT/OT

## 2013-07-30 NOTE — Assessment & Plan Note (Addendum)
Baseline Cr appears to be at best 2.5 ;will recheck periodically

## 2013-07-30 NOTE — Progress Notes (Signed)
MRN: [id number] Name: Lindsay Shannon  Sex: female Age: 79 y.o. DOB: 1923/08/15  PSC #: [facility] Facility/Room: 312A Level Of Care: SNF Provider: Merrilee Seashore D Emergency Contacts: Extended Emergency Contact Information Primary Emergency Contact: Bell,Gregory  [place] Home Phone: [phone] Relation: Brother Secondary Emergency Contact: Heyward,Dorothy Address: [address]          [place] [address] [place] Home Phone: [phone] Mobile Phone: [phone] Relation: Friend  Code Status: FULL  Allergies: Review of patient's allergies indicates no known allergies.  Chief Complaint  Patient presents with  . nursing home admission    HPI: Patient is 79 y.o. female who is admitted after CVA for OT/PT.  Past Medical History  Diagnosis Date  . Hypertension   . Stroke 07/2013  . Chronic kidney disease     CKD stage 4    Past Surgical History  Procedure Laterality Date  . Partial hip arthroplasty    . Replacement total knee        Medication List       This list is accurate as of: 07/30/13 12:04 PM.  Always use your most recent med list.               amLODipine 10 MG tablet  Commonly known as:  NORVASC  Take 1 tablet (10 mg total) by mouth daily.     aspirin 325 MG tablet  Take 1 tablet (325 mg total) by mouth daily.     atenolol 50 MG tablet  Commonly known as:  TENORMIN  Take 50 mg by mouth daily.        No orders of the defined types were placed in this encounter.     There is no immunization history on file for this patient.  History  Substance Use Topics  . Smoking status: Never Smoker   . Smokeless tobacco: Not on file  . Alcohol Use: No    Family history is noncontributory    Review of Systems  DATA OBTAINED: from patient, nurse GENERAL: Feels well no fevers, fatigue, appetite changes SKIN: No itching, rash or wounds EYES: No eye pain, redness, discharge EARS: No earache, tinnitus, change  in hearing NOSE: No congestion, drainage or bleeding  MOUTH/THROAT: No mouth or tooth pain, No sore throat, No difficulty chewing or swallowing  RESPIRATORY: No cough, wheezing, SOB CARDIAC: No chest pain, palpitations, lower extremity edema  GI: No abdominal pain, No N/V/D or constipation, No heartburn or reflux  GU: No dysuria, frequency or urgency, or incontinence  MUSCULOSKELETAL:R arm hurts some NEUROLOGIC: Awake, alert, appropriate to situation: says R arm weakness is better;says she can feed herself with R hand PSYCHIATRIC: No overt anxiety or sadness. Nurse says she has been trying to getup when she shouldn't    Filed Vitals:   07/30/13 1114  BP: 138/79  Pulse: 69  Temp: 97.1 F (36.2 C)  Resp: 18    Physical Exam  GENERAL APPEARANCE: Alert, conversant. Appropriately groomed. No acute distress.  SKIN: No diaphoresis rash, or wounds HEAD: Normocephalic, atraumatic  EYES: Conjunctiva/lids clear. Pupils round, reactive. EOMs intact.  EARS: External exam WNL, canals clear. Hearing grossly normal.  NOSE: No deformity or discharge.  MOUTH/THROAT: Lips w/o lesions.  RESPIRATORY: Breathing is even, unlabored. Lung sounds are clear   CARDIOVASCULAR: Heart RRR no murmurs, rubs or gallops. No peripheral edema.  VENOUS: No varicosities. No venous stasis skin changes  GASTROINTESTINAL: Abdomen is soft, non-tender, not distended w/ normal bowel sounds.  MUSCULOSKELETAL: No abnormal joints or musculature NEUROLOGIC: Oriented X2. Mild R facial weakness AND MILD SLURRED SPEECH; R grip 1/5; RUE- can move it side to side but not up 2/5; RLE 4/5 PSYCHIATRIC: Mood and affect appropriate to situation,  Patient Active Problem List   Diagnosis Date Noted  . TIA (transient ischemic attack) 07/24/2013  . CVA (cerebral infarction) 07/24/2013  . UTI (urinary tract infection) 07/24/2013  . Hypertensive kidney disease with chronic kidney disease stage IV 07/24/2013  . Paraproteinemia 09/06/2012   . CATARACTS, BILATERAL 02/08/2008  . VITAMIN B12 DEFICIENCY 08/11/2007  . HYPERTENSION 08/11/2007  . HEMORRHOIDS 08/11/2007  . CONSTIPATION, CHRONIC 08/11/2007  . RENAL INSUFFICIENCY 08/11/2007  . OSTEOARTHRITIS 08/11/2007  . NEOPLASM, BENIGN, STOMACH 01/30/2004  . GASTRITIS 01/30/2004    CBC    Component Value Date/Time   WBC 5.4 07/26/2013 0515   WBC 8.2 07/12/2013 1308   RBC 3.62* 07/26/2013 0515   RBC 3.34* 07/12/2013 1308   RBC 2.64* 07/31/2008 0340   HGB 11.1* 07/26/2013 0515   HGB 10.2* 07/12/2013 1308   HCT 33.2* 07/26/2013 0515   HCT 31.3* 07/12/2013 1308   PLT 186 07/26/2013 0515   PLT 271 07/12/2013 1308   MCV 91.7 07/26/2013 0515   MCV 93.8 07/12/2013 1308   LYMPHSABS 1.6 07/12/2013 1308   LYMPHSABS 1.8 07/30/2008 2010   MONOABS 0.8 07/12/2013 1308   MONOABS 0.7 07/30/2008 2010   EOSABS 0.1 07/12/2013 1308   EOSABS 0.1 07/30/2008 2010   BASOSABS 0.0 07/12/2013 1308   BASOSABS 0.0 07/30/2008 2010    CMP     Component Value Date/Time   NA 136 07/26/2013 0515   NA 144 07/12/2013 1309   K 4.3 07/26/2013 0515   K 4.6 07/12/2013 1309   CL 109 07/26/2013 0515   CL 114* 09/06/2012 1032   CO2 14* 07/26/2013 0515   CO2 19* 07/12/2013 1309   GLUCOSE 71 07/26/2013 0515   GLUCOSE 108 07/12/2013 1309   GLUCOSE 90 09/06/2012 1032   BUN 27* 07/26/2013 0515   BUN 36.6* 07/12/2013 1309   CREATININE 2.52* 07/26/2013 0515   CREATININE 3.6* 07/12/2013 1309   CALCIUM 8.6 07/26/2013 0515   CALCIUM 9.4 07/12/2013 1309   PROT 7.8 07/23/2013 2250   PROT 8.3 07/12/2013 1309   ALBUMIN 3.2* 07/23/2013 2250   ALBUMIN 3.2* 07/12/2013 1309   AST 16 07/23/2013 2250   AST 16 07/12/2013 1309   ALT 6 07/23/2013 2250   ALT 8 07/12/2013 1309   ALKPHOS 102 07/23/2013 2250   ALKPHOS 103 07/12/2013 1309   BILITOT 0.2* 07/23/2013 2250   BILITOT 0.35 07/12/2013 1309   GFRNONAA 16* 07/26/2013 0515   GFRAA 19* 07/26/2013 0515    Assessment and Plan  CVA (cerebral infarction) Pt presented with headache for several days, dizzyness, staggering  gate and slurred speech and very high BP with a hx of spikes in BP.MRI- + acute infarct in L external capsule; MRA intracranial atherosclerotic dx; ECHO with EF 60%- no found source of emboli; Neuro recommended ASA 325 mg; PT/OT  Hypertensive kidney disease with chronic kidney disease stage IV Baseline Cr appears to be at best 2.5 ;will recheck periodically  HYPERTENSION Will continue atenolol 50 mg daily and norvasc 10 mg daily    Margit Hanks, MD

## 2013-08-01 ENCOUNTER — Non-Acute Institutional Stay (SKILLED_NURSING_FACILITY): Payer: Medicare Other | Admitting: Nurse Practitioner

## 2013-08-01 DIAGNOSIS — R413 Other amnesia: Secondary | ICD-10-CM

## 2013-08-01 DIAGNOSIS — F411 Generalized anxiety disorder: Secondary | ICD-10-CM

## 2013-08-01 DIAGNOSIS — G4700 Insomnia, unspecified: Secondary | ICD-10-CM

## 2013-08-01 NOTE — Progress Notes (Signed)
Patient ID: Lindsay Shannon, female   DOB: December 01, 1922, 79 y.o.   MRN: [id number]  Nursing Home Location:  [facility]   Place of Service: SNF (31)  Chief Complaint  Patient presents with  . Acute Visit    HPI:  Patient is 79 y.o. female who is admitted to [facility] after CVA for OT/PT; staff is requesting she be seen today due to worsening agitation; noncompliance with medications; and not sleeping.  Pt agrees she does have increased anxiety and agitation; worse after the stroke and lots of anxiety about getting things done (like when she has to go to the bathroom).  Pt also with memory loss; family has seen this as well.  Pt reports she also stays in the bed most of the day and then can not sleep at night; melatonin was given last night with some success.    Review of Systems:   DATA OBTAINED: from patient, nurse, medical record, family member GENERAL:  no fevers, fatigue, appetite changes RESPIRATORY: No cough, wheezing, SOB CARDIAC: No chest pain, palpitations GI: No abdominal pain, No N/V/D or constipation, No heartburn or reflux  GU: No dysuria, frequency or urgency MUSCULOSKELETAL: No unrelieved bone/joint pain NEUROLOGIC: Awake, alert, appropriate to situation, No change in mental status. Moves all four, no focal deficits PSYCHIATRIC: has anxiety and agitation, reports memory loss   Medications: Patient's Medications  New Prescriptions   No medications on file  Previous Medications   AMLODIPINE (NORVASC) 10 MG TABLET    Take 1 tablet (10 mg total) by mouth daily.   ASPIRIN 325 MG TABLET    Take 1 tablet (325 mg total) by mouth daily.   ATENOLOL (TENORMIN) 50 MG TABLET    Take 50 mg by mouth daily.  Modified Medications   No medications on file  Discontinued Medications   No medications on file     Physical Exam:  Filed Vitals:   08/01/13 1419  BP: 130/82  Pulse: 64  Temp: 97.9 F (36.6 C)  Resp: 20    GENERAL APPEARANCE: Alert, conversant.  Appropriately groomed. No acute distress.  HEAD: Normocephalic, atraumatic  EYES: Conjunctiva/lids clear. Pupils round, reactive. EOMs intact.  EARS: External exam WNL NOSE: No deformity or discharge.  MOUTH/THROAT: Lips w/o lesions. Mouth and throat normal. Tongue moist, w/o lesion.  NECK: No thyroid tenderness, enlargement or nodule  RESPIRATORY: Breathing is even, unlabored. Lung sounds are clear   CARDIOVASCULAR: Heart RRR no murmurs, rubs or gallops. No peripheral edema.  ARTERIAL: radial pulse 2+ GASTROINTESTINAL: Abdomen is soft, non-tender, not distended w/ normal bowel sounds. GENITOURINARY: Bladder non tender, not distended  NEUROLOGIC: Oriented to self  PSYCHIATRIC: Mood and affect appropriate to situation, no behavioral issues during exam  Assessment/Plan 1. Anxiety state, unspecified Will start on lexapro 10 mg daily due to anxiety; psych to evaluate pt.   2. Insomnia Cont melatonin; also encouraged to get out of the bed and participate in activities throughout the day   3. Memory loss Staff to do BIMs; will start aricept 5 mg at this time and increase to 10 mg after 4 weeks.

## 2013-08-21 ENCOUNTER — Non-Acute Institutional Stay (SKILLED_NURSING_FACILITY): Payer: Medicare Other | Admitting: Nurse Practitioner

## 2013-08-21 DIAGNOSIS — I639 Cerebral infarction, unspecified: Secondary | ICD-10-CM

## 2013-08-21 DIAGNOSIS — N184 Chronic kidney disease, stage 4 (severe): Secondary | ICD-10-CM | POA: Insufficient documentation

## 2013-08-21 DIAGNOSIS — R413 Other amnesia: Secondary | ICD-10-CM | POA: Insufficient documentation

## 2013-08-21 DIAGNOSIS — I10 Essential (primary) hypertension: Secondary | ICD-10-CM

## 2013-08-21 DIAGNOSIS — I6350 Cerebral infarction due to unspecified occlusion or stenosis of unspecified cerebral artery: Secondary | ICD-10-CM

## 2013-08-21 DIAGNOSIS — F411 Generalized anxiety disorder: Secondary | ICD-10-CM | POA: Insufficient documentation

## 2013-08-21 NOTE — Progress Notes (Signed)
Patient ID: Lindsay Shannon, female   DOB: [birth year], 79 y.o.   MRN: [id number]    No Known Allergies  Chief Complaint  Patient presents with  . Medical Managment of Chronic Issues    HPI:  Patient is 79 y.o. female who is admitted to [facility] after CVA for OT/PT; she is being seen today for routine follow up. Since admission staff reported pt had increase agitation and was not sleeping at night. Agitation is better per staff and pt reports her moods/anxiety have been much better. Pt also with memory loss; family has seen this as well was started on Aricept and has been tolerating this without any noted side effects  Still with some ongoing insomnia Late effects from CVA- working with with therapies including PT/OT and ST tolerating ASA 35 mg Hypertension remains stable- taking amlodipine and tenormin  CKD- stage IV- baseline about 3.5 per hospital record   Review of Systems:  Review of Systems  Constitutional: Negative for fever, chills and malaise/fatigue.  Eyes: Negative.   Respiratory: Negative for shortness of breath.   Cardiovascular: Negative for chest pain.  Gastrointestinal: Positive for constipation. Negative for heartburn and diarrhea.  Genitourinary: Negative for dysuria.  Musculoskeletal: Negative for myalgias.  Skin: Negative.   Neurological: Negative for dizziness, sensory change, weakness and headaches.  Psychiatric/Behavioral: Positive for memory loss. Negative for depression. The patient has insomnia. The patient is not nervous/anxious.      Past Medical History  Diagnosis Date  . Hypertension   . Stroke 07/2013  . Chronic kidney disease     CKD stage 4   Past Surgical History  Procedure Laterality Date  . Partial hip arthroplasty    . Replacement total knee     Social History:   reports that she has never smoked. She does not have any smokeless tobacco history on file. She reports that she does not drink alcohol or use illicit drugs.  No family  history on file.  Medications: Patient's Medications  New Prescriptions   No medications on file  Previous Medications   AMLODIPINE (NORVASC) 10 MG TABLET    Take 1 tablet (10 mg total) by mouth daily.   ASPIRIN 325 MG TABLET    Take 1 tablet (325 mg total) by mouth daily.   ATENOLOL (TENORMIN) 50 MG TABLET    Take 50 mg by mouth daily.   DONEPEZIL (ARICEPT) 10 MG TABLET    Take 10 mg by mouth at bedtime. 1/2 tablet for 1 month then will increase to 1 tablet qhs   ESCITALOPRAM (LEXAPRO) 10 MG TABLET    Take 10 mg by mouth daily.  Modified Medications   No medications on file  Discontinued Medications   No medications on file     Physical Exam:  Filed Vitals:   08/21/13 1638  BP: 118/62  Pulse: 58  Temp: 97.7 F (36.5 C)  Resp: 18   GENERAL APPEARANCE: Alert, conversant. Appropriately groomed. No acute distress.  SKIN: No diaphoresis rash, or wounds  HEAD: Normocephalic, atraumatic  EYES: Conjunctiva/lids clear. Pupils round, reactive. EOMs intact.  EARS: External exam WNL, canals clear. Hearing grossly normal.  NOSE: No deformity or discharge.  MOUTH/THROAT: Lips w/o lesions.  RESPIRATORY: Breathing is even, unlabored. Lung sounds are clear  CARDIOVASCULAR: Heart RRR no murmurs, rubs or gallops. No peripheral edema.  VENOUS: No varicosities. No venous stasis skin changes  GASTROINTESTINAL: Abdomen is soft, non-tender, not distended w/ normal bowel sounds.  MUSCULOSKELETAL: No abnormal joints or musculature  NEUROLOGIC: Oriented X2. Mild R facial weakness AND MILD SLURRED SPEECH; right sided weakness PSYCHIATRIC: Mood and affect appropriate to situation    Labs reviewed: Basic Metabolic Panel:  Recent Labs  84/13/24 2250 07/24/13 1115 07/25/13 0440 07/26/13 0515  NA 144  --  142 136  K 4.0  --  4.6 4.3  CL 113*  --  110 109  CO2 20  --  20 14*  GLUCOSE 98  --  85 71  BUN 31*  --  29* 27*  CREATININE 3.12* 2.64* 2.86* 2.52*  CALCIUM 9.2  --  8.8 8.6    Liver Function Tests:  Recent Labs  09/06/12 1032 07/12/13 1309 07/23/13 2250  AST 14 16 16   ALT 6 8 6   ALKPHOS 96 103 102  BILITOT 0.24 0.35 0.2*  PROT 7.5 8.3 7.8  ALBUMIN 3.1* 3.2* 3.2*   No results found for this basename: LIPASE, AMYLASE,  in the last 8760 hours No results found for this basename: AMMONIA,  in the last 8760 hours CBC:  Recent Labs  09/06/12 1032 07/12/13 1308 07/23/13 2250 07/24/13 1115 07/26/13 0515  WBC 6.6 8.2 6.4 6.8 5.4  NEUTROABS 4.4 5.7  --   --   --   HGB 9.1* 10.2* 9.6* 10.8* 11.1*  HCT 27.5* 31.3* 28.7* 32.8* 33.2*  MCV 90.3 93.8 90.8 91.6 91.7  PLT 246 271 233 222 186   Cardiac Enzymes:  Recent Labs  07/23/13 2250  CKTOTAL 110   BNP: No components found with this basename: POCBNP,  CBG:  Recent Labs  07/24/13 0003 07/24/13 1155 07/24/13 1633  GLUCAP 87 115* 155*   Basic Metabolic Panel       Result: 08/15/2013 4:35 PM    ( Status: F )       C     Sodium  141        135-145  mEq/L  SLN       Potassium  4.3        3.5-5.3  mEq/L  SLN       Chloride  112        96-112  mEq/L  SLN       CO2  22        19-32  mEq/L  SLN       Glucose  79        70-99  mg/dL  SLN       BUN  37     H  6-23  mg/dL  SLN       Creatinine  3.04     H  0.50-1.10  mg/dL  SLN       Calcium  8.2     L  8.4-10.5      Assessment/Plan 1. HYPERTENSION Patient is stable; continue current regimen. Will monitor and make changes as necessary.  2. CVA (cerebral infarction) Doing well with therapies; cont ASA  3. CKD (chronic kidney disease) stage 4, GFR 15-29 ml/min Cr at baseline is 3.5; currently 3.04 will cont to monitor  4. Memory loss Stable on aricept; will cont current medication at this time  5. Anxiety state, unspecified Improved will cont lexapro at current dose   6. Constipation Will add colace 100 mg BID for constipation Encourage adequate PO intake

## 2013-08-27 ENCOUNTER — Non-Acute Institutional Stay (SKILLED_NURSING_FACILITY): Payer: Medicare Other | Admitting: Nurse Practitioner

## 2013-08-27 DIAGNOSIS — M1990 Unspecified osteoarthritis, unspecified site: Secondary | ICD-10-CM

## 2013-08-27 DIAGNOSIS — R42 Dizziness and giddiness: Secondary | ICD-10-CM

## 2013-08-27 NOTE — Progress Notes (Signed)
Patient ID: Lindsay Shannon, female   DOB: [birth year], 79 y.o.   MRN: [id number]   No Known Allergies  Chief Complaint  Patient presents with  . Acute Visit    knee pain and swimmy headed     HPI:  79 year old female who is being seen today due at the request of nursing due to pain in right knee. Pt reports a long history of arthritis in her knee;  this has been ongoing for years and sometimes the pain is worse than others. Had bad pain "the other night." pt has no PRN pain medication. No swelling, brusing or heat noted   Pt also reports increase in dizziness that happens when she moves her head over the past 2-3 days;  she denies congestion, pain, allergies, tinnitus, denies headaches. No fevers or chills. Reports swimmy headed for hours; rooms spinning; only helps when she lays down  Review of Systems:  Review of Systems  Constitutional: Negative for fever, chills and malaise/fatigue.  HENT: Negative for congestion, ear pain, hearing loss, sore throat and tinnitus.   Eyes: Negative.   Respiratory: Negative for cough, sputum production and shortness of breath.   Cardiovascular: Negative for chest pain.  Gastrointestinal: Negative for heartburn and diarrhea.  Genitourinary: Negative for dysuria.  Musculoskeletal: Positive for joint pain. Negative for myalgias (knee pain; OA).  Skin: Negative.   Neurological: Positive for dizziness. Negative for sensory change, weakness and headaches.  Psychiatric/Behavioral: Positive for memory loss. Negative for depression. The patient is not nervous/anxious.      Past Medical History  Diagnosis Date  . Hypertension   . Stroke 07/2013  . Chronic kidney disease     CKD stage 4   Past Surgical History  Procedure Laterality Date  . Partial hip arthroplasty    . Replacement total knee     Social History:   reports that she has never smoked. She does not have any smokeless tobacco history on file. She reports that she does not drink alcohol or use  illicit drugs.  No family history on file.  Medications: Patient's Medications  New Prescriptions   No medications on file  Previous Medications   AMLODIPINE (NORVASC) 10 MG TABLET    Take 1 tablet (10 mg total) by mouth daily.   ASPIRIN 325 MG TABLET    Take 1 tablet (325 mg total) by mouth daily.   ATENOLOL (TENORMIN) 50 MG TABLET    Take 50 mg by mouth daily.   DONEPEZIL (ARICEPT) 10 MG TABLET    Take 10 mg by mouth at bedtime. 1/2 tablet for 1 month then will increase to 1 tablet qhs   ESCITALOPRAM (LEXAPRO) 10 MG TABLET    Take 10 mg by mouth daily.  Modified Medications   No medications on file  Discontinued Medications   No medications on file     Physical Exam:  Filed Vitals:   08/27/13 1230  BP: 130/72  Pulse: 60  Temp: 98.7 F (37.1 C)  Resp: 18   Physical Exam  Vitals reviewed. Constitutional: She is well-developed, well-nourished, and in no distress. No distress.  HENT:  Head: Normocephalic and atraumatic.  Right Ear: External ear and ear canal normal.  Left Ear: External ear and ear canal normal.  Nose: Nose normal.  Mouth/Throat: Oropharynx is clear and moist. No oropharyngeal exudate.  non bulging fluid wave noted bilateral   Eyes: Conjunctivae and EOM are normal. Pupils are equal, round, and reactive to light.  Neck: Normal  range of motion. Neck supple.  Cardiovascular: Normal rate and regular rhythm.   Pulmonary/Chest: Effort normal and breath sounds normal. No respiratory distress.  Abdominal: Bowel sounds are normal.  Musculoskeletal: She exhibits tenderness (with passive ROM able to move  without pain). She exhibits no edema.  Neurological: She is alert.  Right sided weakness  Skin: Skin is warm and dry. She is not diaphoretic.  Psychiatric: Affect normal.      Labs reviewed: Basic Metabolic Panel:  Recent Labs  16/10/96 2250 07/24/13 1115 07/25/13 0440 07/26/13 0515  NA 144  --  142 136  K 4.0  --  4.6 4.3  CL 113*  --  110 109   CO2 20  --  20 14*  GLUCOSE 98  --  85 71  BUN 31*  --  29* 27*  CREATININE 3.12* 2.64* 2.86* 2.52*  CALCIUM 9.2  --  8.8 8.6   Liver Function Tests:  Recent Labs  09/06/12 1032 07/12/13 1309 07/23/13 2250  AST 14 16 16   ALT 6 8 6   ALKPHOS 96 103 102  BILITOT 0.24 0.35 0.2*  PROT 7.5 8.3 7.8  ALBUMIN 3.1* 3.2* 3.2*   No results found for this basename: LIPASE, AMYLASE,  in the last 8760 hours No results found for this basename: AMMONIA,  in the last 8760 hours CBC:  Recent Labs  09/06/12 1032 07/12/13 1308 07/23/13 2250 07/24/13 1115 07/26/13 0515  WBC 6.6 8.2 6.4 6.8 5.4  NEUTROABS 4.4 5.7  --   --   --   HGB 9.1* 10.2* 9.6* 10.8* 11.1*  HCT 27.5* 31.3* 28.7* 32.8* 33.2*  MCV 90.3 93.8 90.8 91.6 91.7  PLT 246 271 233 222 186   Cardiac Enzymes:  Recent Labs  07/23/13 2250  CKTOTAL 110   BNP: No components found with this basename: POCBNP,  CBG:  Recent Labs  07/24/13 0003 07/24/13 1155 07/24/13 1633  GLUCAP 87 115* 155*    Assessment/Plan 1. OSTEOARTHRITIS -no effusion or errythema noted; pt without increase in pain today; Will have staff give tramadol 50 mg q 6 hours as needed for pain   2. Vertigo Serous otitis media vs BPPV No fevers noted; pt without pain or signs of infection Will give meclizine 12.5 mg q6 hours as needed for dizziness Will also add Claritin PO daily

## 2013-09-28 ENCOUNTER — Non-Acute Institutional Stay (SKILLED_NURSING_FACILITY): Payer: Medicare Other | Admitting: Nurse Practitioner

## 2013-09-28 DIAGNOSIS — F411 Generalized anxiety disorder: Secondary | ICD-10-CM

## 2013-09-28 DIAGNOSIS — I6990 Unspecified sequelae of unspecified cerebrovascular disease: Secondary | ICD-10-CM

## 2013-09-28 DIAGNOSIS — G4700 Insomnia, unspecified: Secondary | ICD-10-CM | POA: Insufficient documentation

## 2013-09-28 DIAGNOSIS — M1990 Unspecified osteoarthritis, unspecified site: Secondary | ICD-10-CM

## 2013-09-28 DIAGNOSIS — I10 Essential (primary) hypertension: Secondary | ICD-10-CM

## 2013-09-28 DIAGNOSIS — R413 Other amnesia: Secondary | ICD-10-CM

## 2013-09-28 NOTE — Progress Notes (Signed)
Patient ID: Lindsay Shannon, female   DOB: 06-Nov-1923, 79 y.o.   MRN: [id number] Nursing Home Location:  [facility]   Place of Service: SNF (31)  No Known Allergies  Chief Complaint  Patient presents with  . Medical Managment of Chronic Issues    HPI:  Patient is 79 y.o. female who is admitted to [facility] after CVA for OT/PT; she is being seen today for routine follow up.  Late effects from CVA- speech and mood have improved; working with with therapies including PT/OT and ST tolerating ASA 35 mg  Hypertension remains stable- taking amlodipine and tenormin  CKD- stage IV- recent labs are stable; baseline about 3.5 per hospital record   Review of Systems:  Review of Systems  Constitutional: Negative for fever, chills and malaise/fatigue.  Eyes: Negative.   Respiratory: Negative for shortness of breath.   Cardiovascular: Negative for chest pain.  Gastrointestinal: Negative for heartburn, diarrhea and constipation.  Genitourinary: Negative for dysuria.  Musculoskeletal: Negative for myalgias.  Skin: Negative.   Neurological: Negative for dizziness, sensory change, weakness and headaches.  Psychiatric/Behavioral: Positive for memory loss. Negative for depression. The patient has insomnia (improved). The patient is not nervous/anxious.      Past Medical History  Diagnosis Date  . Hypertension   . Stroke 07/2013  . Chronic kidney disease     CKD stage 4   Past Surgical History  Procedure Laterality Date  . Partial hip arthroplasty    . Replacement total knee     Social History:   reports that she has never smoked. She does not have any smokeless tobacco history on file. She reports that she does not drink alcohol or use illicit drugs.  No family history on file.  Medications: Patient's Medications  New Prescriptions   No medications on file  Previous Medications   AMLODIPINE (NORVASC) 10 MG TABLET    Take 1 tablet (10 mg total) by mouth daily.   ASPIRIN  325 MG TABLET    Take 1 tablet (325 mg total) by mouth daily.   ATENOLOL (TENORMIN) 50 MG TABLET    Take 50 mg by mouth daily.   DONEPEZIL (ARICEPT) 10 MG TABLET    Take 10 mg by mouth at bedtime. 1/2 tablet for 1 month then will increase to 1 tablet qhs   ESCITALOPRAM (LEXAPRO) 10 MG TABLET    Take 10 mg by mouth daily.   LORATADINE (CLARITIN) 10 MG TABLET    Take 10 mg by mouth daily.   MECLIZINE (ANTIVERT) 12.5 MG TABLET    Take 12.5 mg by mouth every 6 (six) hours as needed for dizziness.  Modified Medications   No medications on file  Discontinued Medications   No medications on file     Physical Exam:  Filed Vitals:   09/28/13 1208  BP: 104/70  Pulse: 62  Temp: 97.2 F (36.2 C)  Resp: 16    Physical Exam  Vitals reviewed. Constitutional: She is well-developed, well-nourished, and in no distress. No distress.  HENT:  Head: Normocephalic and atraumatic.  Right Ear: External ear and ear canal normal.  Left Ear: External ear and ear canal normal.  Nose: Nose normal.  Mouth/Throat: Oropharynx is clear and moist. No oropharyngeal exudate.  Eyes: Conjunctivae and EOM are normal. Pupils are equal, round, and reactive to light.  Neck: Normal range of motion. Neck supple.  Cardiovascular: Normal rate and regular rhythm.   Pulmonary/Chest: Effort normal and breath sounds normal. No respiratory  distress.  Abdominal: Bowel sounds are normal.  Musculoskeletal: She exhibits no edema and no tenderness.  Neurological: She is alert.  Right sided weakness  Skin: Skin is warm and dry. She is not diaphoretic.  Psychiatric: Affect normal.     Labs reviewed: Basic Metabolic Panel:  Recent Labs  11/91/47 2250 07/24/13 1115 07/25/13 0440 07/26/13 0515  NA 144  --  142 136  K 4.0  --  4.6 4.3  CL 113*  --  110 109  CO2 20  --  20 14*  GLUCOSE 98  --  85 71  BUN 31*  --  29* 27*  CREATININE 3.12* 2.64* 2.86* 2.52*  CALCIUM 9.2  --  8.8 8.6   Liver Function Tests:  Recent  Labs  07/12/13 1309 07/23/13 2250  AST 16 16  ALT 8 6  ALKPHOS 103 102  BILITOT 0.35 0.2*  PROT 8.3 7.8  ALBUMIN 3.2* 3.2*   No results found for this basename: LIPASE, AMYLASE,  in the last 8760 hours No results found for this basename: AMMONIA,  in the last 8760 hours CBC:  Recent Labs  07/12/13 1308 07/23/13 2250 07/24/13 1115 07/26/13 0515  WBC 8.2 6.4 6.8 5.4  NEUTROABS 5.7  --   --   --   HGB 10.2* 9.6* 10.8* 11.1*  HCT 31.3* 28.7* 32.8* 33.2*  MCV 93.8 90.8 91.6 91.7  PLT 271 233 222 186   Cardiac Enzymes:  Recent Labs  07/23/13 2250  CKTOTAL 110   BNP: No components found with this basename: POCBNP,  CBG:  Recent Labs  07/24/13 0003 07/24/13 1155 07/24/13 1633  GLUCAP 87 115* 155*   TSH: No results found for this basename: TSH,  in the last 8760 hours A1C: Lab Results  Component Value Date   HGBA1C 5.6 07/24/2013   Lipid Panel:  Recent Labs  07/25/13 0440  CHOL 155  HDL 55  LDLCALC 89  TRIG 57  CHOLHDL 2.8   Basic Metabolic Panel       Result: 08/15/2013 4:35 PM    ( Status: F )       C     Sodium  141        135-145  mEq/L  SLN       Potassium  4.3        3.5-5.3  mEq/L  SLN       Chloride  112        96-112  mEq/L  SLN       CO2  22        19-32  mEq/L  SLN       Glucose  79        70-99  mg/dL  SLN       BUN  37     H  6-23  mg/dL  SLN       Creatinine  3.04     H  0.50-1.10  mg/dL  SLN       Calcium  8.2     L  8.4-10.5  mg/dL  SLN       Basic Metabolic Panel       Result: 09/20/2013 3:56 PM    ( Status: F )       C     Sodium  139        135-145  mEq/L  SLN       Potassium  4.9        3.5-5.3  mEq/L  SLN       Chloride  107        96-112  mEq/L  SLN       CO2  19        19-32  mEq/L  SLN       Glucose  89        70-99  mg/dL  SLN       BUN  47     H  6-23  mg/dL  SLN       Creatinine  3.85     H  0.50-1.10  mg/dL  SLN       Calcium  8.6       Basic Metabolic Panel       Result: 09/27/2013 4:02 PM    ( Status: F )       C      Sodium  143        135-145  mEq/L  SLN       Potassium  4.6        3.5-5.3  mEq/L  SLN       Chloride  114     H  96-112  mEq/L  SLN       CO2  18     L  19-32  mEq/L  SLN       Glucose  76        70-99  mg/dL  SLN       BUN  48     H  6-23  mg/dL  SLN       Creatinine  3.57     H  0.50-1.10  mg/dL  SLN       Calcium  8.5           Assessment/Plan 1. HYPERTENSION - well controlled on current medications  2. Anxiety state, unspecified -improved on lexapro  3. Memory loss -on aricept doing well on current medications   4. OSTEOARTHRITIS -pain is stable on current medications  5. Insomnia -improved  6. CVA, late effect Stable conts with dysphagia 3 diet and asa conts with therapy plan is to go home with son

## 2013-10-26 ENCOUNTER — Encounter: Payer: Self-pay | Admitting: Nurse Practitioner

## 2013-10-26 ENCOUNTER — Non-Acute Institutional Stay (SKILLED_NURSING_FACILITY): Payer: Medicare Other | Admitting: Nurse Practitioner

## 2013-10-26 DIAGNOSIS — F411 Generalized anxiety disorder: Secondary | ICD-10-CM

## 2013-10-26 DIAGNOSIS — M1990 Unspecified osteoarthritis, unspecified site: Secondary | ICD-10-CM

## 2013-10-26 DIAGNOSIS — R413 Other amnesia: Secondary | ICD-10-CM

## 2013-10-26 DIAGNOSIS — N184 Chronic kidney disease, stage 4 (severe): Secondary | ICD-10-CM

## 2013-10-26 DIAGNOSIS — I10 Essential (primary) hypertension: Secondary | ICD-10-CM

## 2013-10-26 NOTE — Progress Notes (Signed)
Patient ID: Lindsay Shannon, female   DOB: [birth year], 79 y.o.   MRN: [id number]    Nursing Home Location:  [facility]   Place of Service: SNF (31)  PCP: No primary provider on file.  No Known Allergies  Chief Complaint  Patient presents with  . Medical Managment of Chronic Issues    HPI:  Patient is 79 y.o. female who is admitted to [facility] after CVA for OT/PT; she is being seen today for routine follow up on chronic conditions; pt reports she is doing well; staff has no concerns Late effects from CVA- speech and mood have improved;therapies including PT/OT and ST tolerating ASA 35 mg  Hypertension remains stable- taking amlodipine and tenormin  CKD- stage IV- recent labs are stable; baseline about 3.5  Review of Systems:  Review of Systems  Constitutional: Negative for fever, chills and malaise/fatigue.  Eyes: Negative.   Respiratory: Negative for shortness of breath.   Cardiovascular: Negative for chest pain.  Gastrointestinal: Negative for heartburn, diarrhea and constipation.  Genitourinary: Negative for dysuria.  Musculoskeletal: Negative for myalgias.  Skin: Negative.   Neurological: Negative for dizziness, sensory change, weakness and headaches.  Psychiatric/Behavioral: Positive for memory loss. Negative for depression. The patient has insomnia (improved). The patient is not nervous/anxious.      Past Medical History  Diagnosis Date  . Hypertension   . Stroke 07/2013  . Chronic kidney disease     CKD stage 4   Past Surgical History  Procedure Laterality Date  . Partial hip arthroplasty    . Replacement total knee     Social History:   reports that she has never smoked. She does not have any smokeless tobacco history on file. She reports that she does not drink alcohol or use illicit drugs.  No family history on file.  Medications: Patient's Medications  New Prescriptions   No medications on file  Previous Medications   AMLODIPINE  (NORVASC) 10 MG TABLET    Take 1 tablet (10 mg total) by mouth daily.   ASPIRIN 325 MG TABLET    Take 1 tablet (325 mg total) by mouth daily.   ATENOLOL (TENORMIN) 50 MG TABLET    Take 50 mg by mouth daily.   DONEPEZIL (ARICEPT) 10 MG TABLET    Take 10 mg by mouth at bedtime. 1/2 tablet for 1 month then will increase to 1 tablet qhs   ESCITALOPRAM (LEXAPRO) 10 MG TABLET    Take 10 mg by mouth daily.   LORATADINE (CLARITIN) 10 MG TABLET    Take 10 mg by mouth daily.   MECLIZINE (ANTIVERT) 12.5 MG TABLET    Take 12.5 mg by mouth every 6 (six) hours as needed for dizziness.  Modified Medications   No medications on file  Discontinued Medications   No medications on file     Physical Exam: Physical Exam  Vitals reviewed. Constitutional: She is well-developed, well-nourished, and in no distress. No distress.  HENT:  Head: Normocephalic and atraumatic.  Right Ear: External ear and ear canal normal.  Left Ear: External ear and ear canal normal.  Nose: Nose normal.  Mouth/Throat: Oropharynx is clear and moist. No oropharyngeal exudate.  Eyes: Conjunctivae and EOM are normal. Pupils are equal, round, and reactive to light.  Neck: Normal range of motion. Neck supple.  Cardiovascular: Normal rate, regular rhythm and normal heart sounds.   Pulmonary/Chest: Effort normal and breath sounds normal. No respiratory distress.  Abdominal: Soft. Bowel sounds are normal.  She exhibits no distension. There is no tenderness.  Musculoskeletal: She exhibits no edema and no tenderness.  Neurological: She is alert.  Right sided weakness  Skin: Skin is warm and dry. She is not diaphoretic.  Psychiatric: Affect normal.    Filed Vitals:   10/26/13 1534  BP: 122/69  Pulse: 64  Temp: 98.4 F (36.9 C)  Resp: 20      Labs reviewed: Basic Metabolic Panel:  Recent Labs  08/65/78 2250 07/24/13 1115 07/25/13 0440 07/26/13 0515  NA 144  --  142 136  K 4.0  --  4.6 4.3  CL 113*  --  110 109  CO2 20   --  20 14*  GLUCOSE 98  --  85 71  BUN 31*  --  29* 27*  CREATININE 3.12* 2.64* 2.86* 2.52*  CALCIUM 9.2  --  8.8 8.6   Liver Function Tests:  Recent Labs  07/12/13 1309 07/23/13 2250  AST 16 16  ALT 8 6  ALKPHOS 103 102  BILITOT 0.35 0.2*  PROT 8.3 7.8  ALBUMIN 3.2* 3.2*   No results found for this basename: LIPASE, AMYLASE,  in the last 8760 hours No results found for this basename: AMMONIA,  in the last 8760 hours CBC:  Recent Labs  07/12/13 1308 07/23/13 2250 07/24/13 1115 07/26/13 0515  WBC 8.2 6.4 6.8 5.4  NEUTROABS 5.7  --   --   --   HGB 10.2* 9.6* 10.8* 11.1*  HCT 31.3* 28.7* 32.8* 33.2*  MCV 93.8 90.8 91.6 91.7  PLT 271 233 222 186   Cardiac Enzymes:  Recent Labs  07/23/13 2250  CKTOTAL 110   BNP: No components found with this basename: POCBNP,  CBG:  Recent Labs  07/24/13 0003 07/24/13 1155 07/24/13 1633  GLUCAP 87 115* 155*   TSH: No results found for this basename: TSH,  in the last 8760 hours A1C: Lab Results  Component Value Date   HGBA1C 5.6 07/24/2013   Lipid Panel:  Recent Labs  07/25/13 0440  CHOL 155  HDL 55  LDLCALC 89  TRIG 57  CHOLHDL 2.8   Basic Metabolic Panel  Result: 08/15/2013 4:35 PM ( Status: F ) C  Sodium 141 135-145 mEq/L SLN  Potassium 4.3 3.5-5.3 mEq/L SLN  Chloride 112 96-112 mEq/L SLN  CO2 22 19-32 mEq/L SLN  Glucose 79 70-99 mg/dL SLN  BUN 37 H 4-69 mg/dL SLN  Creatinine 6.29 H 0.50-1.10 mg/dL SLN  Calcium 8.2 L 5.2-84.1 mg/dL SLN  Basic Metabolic Panel  Result: 09/20/2013 3:56 PM ( Status: F ) C  Sodium 139 135-145 mEq/L SLN  Potassium 4.9 3.5-5.3 mEq/L SLN  Chloride 107 96-112 mEq/L SLN  CO2 19 19-32 mEq/L SLN  Glucose 89 70-99 mg/dL SLN  BUN 47 H 3-24 mg/dL SLN  Creatinine 4.01 H 0.50-1.10 mg/dL SLN  Calcium 8.6  Basic Metabolic Panel  Result: 09/27/2013 4:02 PM ( Status: F ) C  Sodium 143 135-145 mEq/L SLN  Potassium 4.6 3.5-5.3 mEq/L SLN  Chloride 114 H 96-112 mEq/L SLN  CO2 18 L  19-32 mEq/L SLN  Glucose 76 70-99 mg/dL SLN  BUN 48 H 0-27 mg/dL SLN  Creatinine 2.53 H 0.50-1.10 mg/dL SLN  Calcium 8.5   Basic Metabolic Panel    Result: 10/15/2013 5:50 PM   ( Status: F )       Sodium 140     135-145 mEq/L SLN   Potassium 4.1     3.5-5.3 mEq/L SLN  Chloride 110     96-112 mEq/L SLN   CO2 20     19-32 mEq/L SLN   Glucose 85     70-99 mg/dL SLN   BUN 41   H 0-98 mg/dL SLN   Creatinine 1.19   H 0.50-1.10 mg/dL SLN   Calcium 8.3   L   Assessment/Plan 1. HYPERTENSION -stable at this time, conts atenolol and norvasc  2. OSTEOARTHRITIS -conts with knee pain, PRN medications help, no worsening of pain  3. CKD (chronic kidney disease) stage 4, GFR 15-29 ml/min -stable   4. Memory loss -stable, does well in current environment -conts aricept  5. Anxiety state, unspecified -stable; improved on lexapro   6. Late effects of CVA -stable, conts ASA

## 2013-11-19 ENCOUNTER — Telehealth: Payer: Self-pay | Admitting: Oncology

## 2013-11-19 NOTE — Telephone Encounter (Signed)
Talkedto pt's caretaker and gave her appt for [facility] 2015

## 2013-11-20 ENCOUNTER — Encounter: Payer: Self-pay | Admitting: Nurse Practitioner

## 2013-11-20 ENCOUNTER — Non-Acute Institutional Stay (SKILLED_NURSING_FACILITY): Payer: Medicare Other | Admitting: Nurse Practitioner

## 2013-11-20 DIAGNOSIS — I10 Essential (primary) hypertension: Secondary | ICD-10-CM

## 2013-11-20 DIAGNOSIS — R413 Other amnesia: Secondary | ICD-10-CM

## 2013-11-20 DIAGNOSIS — J069 Acute upper respiratory infection, unspecified: Secondary | ICD-10-CM

## 2013-11-20 DIAGNOSIS — I6350 Cerebral infarction due to unspecified occlusion or stenosis of unspecified cerebral artery: Secondary | ICD-10-CM

## 2013-11-20 DIAGNOSIS — K5909 Other constipation: Secondary | ICD-10-CM

## 2013-11-20 DIAGNOSIS — M1990 Unspecified osteoarthritis, unspecified site: Secondary | ICD-10-CM

## 2013-11-20 DIAGNOSIS — I639 Cerebral infarction, unspecified: Secondary | ICD-10-CM

## 2013-11-20 DIAGNOSIS — F411 Generalized anxiety disorder: Secondary | ICD-10-CM

## 2013-11-20 NOTE — Progress Notes (Signed)
Patient ID: Lindsay Shannon, female   DOB: October 27, 1923, 80 y.o.   MRN: [id number]    Nursing Home Location:  [place] of Service: SNF (31)  PCP: No primary provider on file.  No Known Allergies  Chief Complaint  Patient presents with  . Medical Managment of Chronic Issues    HPI:  Patient is 80 y.o. female who is admitted to [facility] after CVA; she is being seen today for routine follow up on chronic conditions; pt reports she has had cough and congestion that has gotten worse over the past few days; no shortness of breath Staff notes that pt has become more confused with more freq falls in the past week UA was negitive Late effects from CVA- speech and mood have improved;therapies including PT/OT and ST tolerating ASA 35 mg  Hypertension remains stable- taking amlodipine and tenormin  CKD- stage IV- recent labs are stable; baseline about 3.5  Review of Systems:  Review of Systems  Constitutional: Negative for fever, chills and malaise/fatigue.  HENT: Positive for congestion. Negative for ear pain and sore throat.   Eyes: Negative.   Respiratory: Positive for cough (with congestion ). Negative for shortness of breath and wheezing.   Cardiovascular: Negative for chest pain.  Gastrointestinal: Negative for heartburn, diarrhea and constipation.  Genitourinary: Negative for dysuria.  Musculoskeletal: Negative for myalgias.  Skin: Negative.   Neurological: Negative for dizziness, sensory change, weakness and headaches.  Psychiatric/Behavioral: Positive for memory loss. Negative for depression. The patient is not nervous/anxious.      Past Medical History  Diagnosis Date  . Hypertension   . Stroke 07/2013  . Chronic kidney disease     CKD stage 4   Past Surgical History  Procedure Laterality Date  . Partial hip arthroplasty    . Replacement total knee     Social History:   reports that she has never smoked. She does not have any smokeless tobacco  history on file. She reports that she does not drink alcohol or use illicit drugs.  No family history on file.  Medications: Patient's Medications  New Prescriptions   No medications on file  Previous Medications   AMLODIPINE (NORVASC) 10 MG TABLET    Take 1 tablet (10 mg total) by mouth daily.   ASPIRIN 325 MG TABLET    Take 1 tablet (325 mg total) by mouth daily.   ATENOLOL (TENORMIN) 50 MG TABLET    Take 50 mg by mouth daily.   DONEPEZIL (ARICEPT) 10 MG TABLET    Take 10 mg by mouth at bedtime. 1/2 tablet for 1 month then will increase to 1 tablet qhs   ESCITALOPRAM (LEXAPRO) 10 MG TABLET    Take 10 mg by mouth daily.   LORATADINE (CLARITIN) 10 MG TABLET    Take 10 mg by mouth daily.   MECLIZINE (ANTIVERT) 12.5 MG TABLET    Take 12.5 mg by mouth every 6 (six) hours as needed for dizziness.  Modified Medications   No medications on file  Discontinued Medications   No medications on file     Physical Exam:  Filed Vitals:   11/20/13 1730  BP: 122/62  Pulse: 72  Temp: 97.9 F (36.6 C)  Resp: 20    Physical Exam  Vitals reviewed. Constitutional: She is well-developed, well-nourished, and in no distress. No distress.  HENT:  Head: Normocephalic and atraumatic.  Right Ear: External ear and ear canal normal.  Left Ear: External ear and  ear canal normal.  Nose: Nose normal.  Mouth/Throat: Oropharynx is clear and moist. No oropharyngeal exudate.  Eyes: Conjunctivae and EOM are normal. Pupils are equal, round, and reactive to light.  Neck: Normal range of motion. Neck supple.  Cardiovascular: Normal rate, regular rhythm and normal heart sounds.   Pulmonary/Chest: Effort normal. No respiratory distress. She has rhonchi (throughtou bilaterally lungs ).  Abdominal: Soft. Bowel sounds are normal. She exhibits no distension. There is no tenderness.  Musculoskeletal: She exhibits no edema and no tenderness.  Neurological: She is alert.  Right sided weakness  Skin: Skin is warm  and dry. She is not diaphoretic.  Psychiatric: Affect normal.     Labs reviewed: Basic Metabolic Panel:  Recent Labs  07/23/13 2250 07/24/13 1115 07/25/13 0440 07/26/13 0515  NA 144  --  142 136  K 4.0  --  4.6 4.3  CL 113*  --  110 109  CO2 20  --  20 14*  GLUCOSE 98  --  85 71  BUN 31*  --  29* 27*  CREATININE 3.12* 2.64* 2.86* 2.52*  CALCIUM 9.2  --  8.8 8.6   Liver Function Tests:  Recent Labs  07/12/13 1309 07/23/13 2250  AST 16 16  ALT 8 6  ALKPHOS 103 102  BILITOT 0.35 0.2*  PROT 8.3 7.8  ALBUMIN 3.2* 3.2*   No results found for this basename: LIPASE, AMYLASE,  in the last 8760 hours No results found for this basename: AMMONIA,  in the last 8760 hours CBC:  Recent Labs  07/12/13 1308 07/23/13 2250 07/24/13 1115 07/26/13 0515  WBC 8.2 6.4 6.8 5.4  NEUTROABS 5.7  --   --   --   HGB 10.2* 9.6* 10.8* 11.1*  HCT 31.3* 28.7* 32.8* 33.2*  MCV 93.8 90.8 91.6 91.7  PLT 271 233 222 186   Cardiac Enzymes:  Recent Labs  07/23/13 2250  CKTOTAL 110   BNP: No components found with this basename: POCBNP,  CBG:  Recent Labs  07/24/13 0003 07/24/13 1155 07/24/13 1633  GLUCAP 87 115* 155*   TSH: No results found for this basename: TSH,  in the last 8760 hours A1C: Lab Results  Component Value Date   HGBA1C 5.6 07/24/2013   Lipid Panel:  Recent Labs  07/25/13 0440  CHOL 155  HDL 55  LDLCALC 89  TRIG 57  CHOLHDL 2.8   Basic Metabolic Panel  Result: 14/02/8184 4:35 PM ( Status: F ) C  Sodium 141 135-145 mEq/L SLN  Potassium 4.3 3.5-5.3 mEq/L SLN  Chloride 112 96-112 mEq/L SLN  CO2 22 19-32 mEq/L SLN  Glucose 79 70-99 mg/dL SLN  BUN 37 H 6-23 mg/dL SLN  Creatinine 3.04 H 0.50-1.10 mg/dL SLN  Calcium 8.2 L 8.4-10.5 mg/dL SLN  Basic Metabolic Panel  Result: 63/14/9702 3:56 PM ( Status: F ) C  Sodium 139 135-145 mEq/L SLN  Potassium 4.9 3.5-5.3 mEq/L SLN  Chloride 107 96-112 mEq/L SLN  CO2 19 19-32 mEq/L SLN  Glucose 89 70-99 mg/dL  SLN  BUN 47 H 6-23 mg/dL SLN  Creatinine 3.85 H 0.50-1.10 mg/dL SLN  Calcium 8.6  Basic Metabolic Panel  Result: 63/78/5885 4:02 PM ( Status: F ) C  Sodium 143 135-145 mEq/L SLN  Potassium 4.6 3.5-5.3 mEq/L SLN  Chloride 114 H 96-112 mEq/L SLN  CO2 18 L 19-32 mEq/L SLN  Glucose 76 70-99 mg/dL SLN  BUN 48 H 6-23 mg/dL SLN  Creatinine 3.57 H 0.50-1.10 mg/dL SLN  Calcium 8.5  Basic Metabolic Panel  Result: [id number] 5:50 PM ( Status: F )  Sodium 140 135-145 mEq/L SLN  Potassium 4.1 3.5-5.3 mEq/L SLN  Chloride 110 96-112 mEq/L SLN  CO2 20 19-32 mEq/L SLN  Glucose 85 70-99 mg/dL SLN  BUN 41 H 6-23 mg/dL SLN  Creatinine 3.26 H 0.50-1.10 mg/dL SLN  Calcium 8.3 L      Assessment/Plan 1. HYPERTENSION -Patients blood pressure is stable; continue current regimen. Will monitor and make changes as necessary.  2. CONSTIPATION, CHRONIC -stable on PRNs  3. Memory loss -conts on aricept 10 mg  4. Anxiety state, unspecified -improved on lexapro   5. CVA (cerebral infarction) -stable, conts on asa   6. OSTEOARTHRITIS - stable at this time; has PRN medication if needed which helps  7. Upper respiratory infection with cough and congestion -no fevers on file; no shortness of breath noted -will get chest xray to rule out pneumonia -cbc with diff  -duonebs q 6 hours for 7 days -mucinex DM 1 tablet BID for 14 days

## 2013-11-30 ENCOUNTER — Non-Acute Institutional Stay (SKILLED_NURSING_FACILITY): Payer: Medicare Other | Admitting: Nurse Practitioner

## 2013-11-30 DIAGNOSIS — N184 Chronic kidney disease, stage 4 (severe): Secondary | ICD-10-CM

## 2013-11-30 DIAGNOSIS — N189 Chronic kidney disease, unspecified: Secondary | ICD-10-CM

## 2013-11-30 DIAGNOSIS — I639 Cerebral infarction, unspecified: Secondary | ICD-10-CM

## 2013-11-30 DIAGNOSIS — D631 Anemia in chronic kidney disease: Secondary | ICD-10-CM

## 2013-11-30 DIAGNOSIS — N039 Chronic nephritic syndrome with unspecified morphologic changes: Secondary | ICD-10-CM

## 2013-11-30 DIAGNOSIS — R413 Other amnesia: Secondary | ICD-10-CM

## 2013-11-30 DIAGNOSIS — I6350 Cerebral infarction due to unspecified occlusion or stenosis of unspecified cerebral artery: Secondary | ICD-10-CM

## 2013-11-30 DIAGNOSIS — F411 Generalized anxiety disorder: Secondary | ICD-10-CM

## 2013-11-30 DIAGNOSIS — I10 Essential (primary) hypertension: Secondary | ICD-10-CM

## 2013-11-30 NOTE — Progress Notes (Signed)
Patient ID: Lindsay Shannon, female   DOB: 05/24/1923, 80 y.o.   MRN: [id number]    Nursing Home Location:  [place] of Service: SNF (31)  PCP: No primary provider on file.  No Known Allergies  Chief Complaint  Patient presents with  . Discharge Note    HPI:  Patient is 80 y.o. female who is admitted to [facility] after CVA; she has done well with therapy and now her family is wanting to take her home with 24 hour care; she is being seen today for discharge  Review of Systems:  Review of Systems  Constitutional: Negative for fever, chills and malaise/fatigue.  HENT: Negative for congestion, ear pain and sore throat.   Eyes: Negative.   Respiratory: Negative for cough, shortness of breath and wheezing.   Cardiovascular: Negative for chest pain.  Gastrointestinal: Negative for heartburn, diarrhea and constipation.  Genitourinary: Negative for dysuria.  Musculoskeletal: Negative for myalgias.  Skin: Negative.   Neurological: Negative for dizziness, sensory change, weakness and headaches.  Psychiatric/Behavioral: Positive for memory loss. Negative for depression. The patient is not nervous/anxious.      Past Medical History  Diagnosis Date  . Hypertension   . Stroke 07/2013  . Chronic kidney disease     CKD stage 4   Past Surgical History  Procedure Laterality Date  . Partial hip arthroplasty    . Replacement total knee     Social History:   reports that she has never smoked. She does not have any smokeless tobacco history on file. She reports that she does not drink alcohol or use illicit drugs.  No family history on file.  Medications: Patient's Medications  New Prescriptions   No medications on file  Previous Medications   AMLODIPINE (NORVASC) 10 MG TABLET    Take 1 tablet (10 mg total) by mouth daily.   ASPIRIN 325 MG TABLET    Take 1 tablet (325 mg total) by mouth daily.   ATENOLOL (TENORMIN) 50 MG TABLET    Take 50 mg by mouth daily.     DONEPEZIL (ARICEPT) 10 MG TABLET    Take 10 mg by mouth at bedtime. 1/2 tablet for 1 month then will increase to 1 tablet qhs   ESCITALOPRAM (LEXAPRO) 10 MG TABLET    Take 10 mg by mouth daily.   LORATADINE (CLARITIN) 10 MG TABLET    Take 10 mg by mouth daily.   MECLIZINE (ANTIVERT) 12.5 MG TABLET    Take 12.5 mg by mouth every 6 (six) hours as needed for dizziness.  Modified Medications   No medications on file  Discontinued Medications   No medications on file     Physical Exam:  Filed Vitals:   11/30/13 1042  BP: 116/72  Pulse: 72  Temp: 98.2 F (36.8 C)  Resp: 20    Physical Exam  Vitals reviewed. Constitutional: She is well-developed, well-nourished, and in no distress. No distress.  HENT:  Head: Normocephalic and atraumatic.  Right Ear: External ear and ear canal normal.  Left Ear: External ear and ear canal normal.  Nose: Nose normal.  Mouth/Throat: Oropharynx is clear and moist. No oropharyngeal exudate.  Eyes: Conjunctivae and EOM are normal. Pupils are equal, round, and reactive to light.  Neck: Normal range of motion. Neck supple.  Cardiovascular: Normal rate, regular rhythm and normal heart sounds.   Pulmonary/Chest: Effort normal and breath sounds normal.  Abdominal: Soft. Bowel sounds are normal. She exhibits no distension.  There is no tenderness.  Musculoskeletal: She exhibits no edema and no tenderness.  Neurological: She is alert.  Right sided weakness  Skin: Skin is warm and dry. She is not diaphoretic.  Psychiatric: Affect normal.     Labs reviewed: Basic Metabolic Panel:  Recent Labs  07/23/13 2250 07/24/13 1115 07/25/13 0440 07/26/13 0515  NA 144  --  142 136  K 4.0  --  4.6 4.3  CL 113*  --  110 109  CO2 20  --  20 14*  GLUCOSE 98  --  85 71  BUN 31*  --  29* 27*  CREATININE 3.12* 2.64* 2.86* 2.52*  CALCIUM 9.2  --  8.8 8.6   Liver Function Tests:  Recent Labs  07/12/13 1309 07/23/13 2250  AST 16 16  ALT 8 6  ALKPHOS 103  102  BILITOT 0.35 0.2*  PROT 8.3 7.8  ALBUMIN 3.2* 3.2*   No results found for this basename: LIPASE, AMYLASE,  in the last 8760 hours No results found for this basename: AMMONIA,  in the last 8760 hours CBC:  Recent Labs  07/12/13 1308 07/23/13 2250 07/24/13 1115 07/26/13 0515  WBC 8.2 6.4 6.8 5.4  NEUTROABS 5.7  --   --   --   HGB 10.2* 9.6* 10.8* 11.1*  HCT 31.3* 28.7* 32.8* 33.2*  MCV 93.8 90.8 91.6 91.7  PLT 271 233 222 186   Cardiac Enzymes:  Recent Labs  07/23/13 2250  CKTOTAL 110   BNP: No components found with this basename: POCBNP,  CBG:  Recent Labs  07/24/13 0003 07/24/13 1155 07/24/13 1633  GLUCAP 87 115* 155*   TSH: No results found for this basename: TSH,  in the last 8760 hours A1C: Lab Results  Component Value Date   HGBA1C 5.6 07/24/2013   Lipid Panel:  Recent Labs  07/25/13 0440  CHOL 155  HDL 55  LDLCALC 89  TRIG 57  CHOLHDL 2.8    Basic Metabolic Panel  Result: 95/12/8411 4:35 PM ( Status: F ) C  Sodium 141 135-145 mEq/L SLN  Potassium 4.3 3.5-5.3 mEq/L SLN  Chloride 112 96-112 mEq/L SLN  CO2 22 19-32 mEq/L SLN  Glucose 79 70-99 mg/dL SLN  BUN 37 H 6-23 mg/dL SLN  Creatinine 3.04 H 0.50-1.10 mg/dL SLN  Calcium 8.2 L 8.4-10.5 mg/dL SLN  Basic Metabolic Panel  Result: 24/40/1027 3:56 PM ( Status: F ) C  Sodium 139 135-145 mEq/L SLN  Potassium 4.9 3.5-5.3 mEq/L SLN  Chloride 107 96-112 mEq/L SLN  CO2 19 19-32 mEq/L SLN  Glucose 89 70-99 mg/dL SLN  BUN 47 H 6-23 mg/dL SLN  Creatinine 3.85 H 0.50-1.10 mg/dL SLN  Calcium 8.6  Basic Metabolic Panel  Result: 25/36/6440 4:02 PM ( Status: F ) C  Sodium 143 135-145 mEq/L SLN  Potassium 4.6 3.5-5.3 mEq/L SLN  Chloride 114 H 96-112 mEq/L SLN  CO2 18 L 19-32 mEq/L SLN  Glucose 76 70-99 mg/dL SLN  BUN 48 H 6-23 mg/dL SLN  Creatinine 3.57 H 0.50-1.10 mg/dL SLN  Calcium 8.5  Basic Metabolic Panel  Result: 34/05/4258 5:50 PM ( Status: F )  Sodium 140 135-145 mEq/L SLN    Potassium 4.1 3.5-5.3 mEq/L SLN  Chloride 110 96-112 mEq/L SLN  CO2 20 19-32 mEq/L SLN  Glucose 85 70-99 mg/dL SLN  BUN 41 H 6-23 mg/dL SLN  Creatinine 3.26 H 0.50-1.10 mg/dL SLN  Calcium 8.3 L   CBC with 3 part Diff    Result:  11/21/2013 3:47 PM   ( Status: F )       WBC 4.5     4.0-10.5 K/uL WML   RBC 2.56   L 3.87-5.11 MIL/uL WML   Hemoglobin 7.6   L 12.0-15.0 g/dL WML   Hematocrit 23.5   L 36.0-46.0 % WML   MCV 91.9     78.0-100.0 fL WML   MCH 29.7     26.0-34.0 pg WML   MCHC 32.3     30.0-36.0 g/dL WML   RDW 13.7     11.5-15.5 % WML   Platelet Count 186     150-400 K/uL WML   Granulocyte % 58     43-77 % WML   Absolute Gran 2.6     1.7-7.7 K/uL WML   Lymph % 33     12-46 % WML   Absolute Lymph 1.5     0.7-4.0 K/uL WML   Mono % 9     3-12 % WML   Absolute Mono 0.4     0.1-1.0 K/uL WML   Smear Review Criteria for review not met  WML   Assessment/Plan 1. HYPERTENSION -Patient is stable; continue current regimen. norvasc and atentolol   2. CVA (cerebral infarction) -stable at this time; conts on ASA  3. CKD (chronic kidney disease) stage 4, GFR 15-29 ml/min -Cr is 3.26 on dec labs which is baseline  4. Anxiety state, unspecified -stable on lexapro  5. Memory loss -ongoing; conts on aricept 10 mg qhs  6. Anemia in CKD -no noted blood loss; with hgb of 7.6 on last labs prior study was in sept; -pt will need follow up with PCP regarding this; she is currently asymptomatic   pt is stable for discharge-will need PT/OT per home health. No DME needed. Rx written.  will need to follow up with PCP within 1- 2 weeks.

## 2013-12-03 ENCOUNTER — Other Ambulatory Visit: Payer: Self-pay | Admitting: Internal Medicine

## 2013-12-03 ENCOUNTER — Ambulatory Visit
Admission: RE | Admit: 2013-12-03 | Discharge: 2013-12-03 | Disposition: A | Discharge status: Home / Self Care | Payer: Medicare Other | Source: Ambulatory Visit | Attending: Internal Medicine | Admitting: Internal Medicine

## 2013-12-03 DIAGNOSIS — M25559 Pain in unspecified hip: Secondary | ICD-10-CM

## 2013-12-08 ENCOUNTER — Inpatient Hospital Stay (HOSPITAL_COMMUNITY)
Admission: EM | Admit: 2013-12-08 | Discharge: 2013-12-12 | DRG: 536 | Disposition: A | Discharge status: Skilled Nursing Facility | Payer: Medicare Other | Attending: Internal Medicine | Admitting: Internal Medicine

## 2013-12-08 ENCOUNTER — Emergency Department (HOSPITAL_COMMUNITY): Payer: Medicare Other

## 2013-12-08 ENCOUNTER — Encounter (HOSPITAL_COMMUNITY): Payer: Self-pay | Admitting: Emergency Medicine

## 2013-12-08 DIAGNOSIS — K649 Unspecified hemorrhoids: Secondary | ICD-10-CM

## 2013-12-08 DIAGNOSIS — Z7982 Long term (current) use of aspirin: Secondary | ICD-10-CM

## 2013-12-08 DIAGNOSIS — N390 Urinary tract infection, site not specified: Secondary | ICD-10-CM

## 2013-12-08 DIAGNOSIS — I129 Hypertensive chronic kidney disease with stage 1 through stage 4 chronic kidney disease, or unspecified chronic kidney disease: Secondary | ICD-10-CM

## 2013-12-08 DIAGNOSIS — I639 Cerebral infarction, unspecified: Secondary | ICD-10-CM

## 2013-12-08 DIAGNOSIS — F0390 Unspecified dementia without behavioral disturbance: Secondary | ICD-10-CM | POA: Diagnosis present

## 2013-12-08 DIAGNOSIS — K5909 Other constipation: Secondary | ICD-10-CM

## 2013-12-08 DIAGNOSIS — S32599A Other specified fracture of unspecified pubis, initial encounter for closed fracture: Secondary | ICD-10-CM

## 2013-12-08 DIAGNOSIS — E538 Deficiency of other specified B group vitamins: Secondary | ICD-10-CM

## 2013-12-08 DIAGNOSIS — N179 Acute kidney failure, unspecified: Secondary | ICD-10-CM | POA: Diagnosis present

## 2013-12-08 DIAGNOSIS — R64 Cachexia: Secondary | ICD-10-CM | POA: Diagnosis present

## 2013-12-08 DIAGNOSIS — K2990 Gastroduodenitis, unspecified, without bleeding: Secondary | ICD-10-CM

## 2013-12-08 DIAGNOSIS — Z96659 Presence of unspecified artificial knee joint: Secondary | ICD-10-CM

## 2013-12-08 DIAGNOSIS — D892 Hypergammaglobulinemia, unspecified: Secondary | ICD-10-CM

## 2013-12-08 DIAGNOSIS — D131 Benign neoplasm of stomach: Secondary | ICD-10-CM

## 2013-12-08 DIAGNOSIS — H269 Unspecified cataract: Secondary | ICD-10-CM

## 2013-12-08 DIAGNOSIS — G4700 Insomnia, unspecified: Secondary | ICD-10-CM

## 2013-12-08 DIAGNOSIS — R413 Other amnesia: Secondary | ICD-10-CM

## 2013-12-08 DIAGNOSIS — Z96649 Presence of unspecified artificial hip joint: Secondary | ICD-10-CM

## 2013-12-08 DIAGNOSIS — E43 Unspecified severe protein-calorie malnutrition: Secondary | ICD-10-CM

## 2013-12-08 DIAGNOSIS — S32591A Other specified fracture of right pubis, initial encounter for closed fracture: Secondary | ICD-10-CM

## 2013-12-08 DIAGNOSIS — K2970 Gastritis, unspecified, without bleeding: Secondary | ICD-10-CM

## 2013-12-08 DIAGNOSIS — D649 Anemia, unspecified: Secondary | ICD-10-CM

## 2013-12-08 DIAGNOSIS — M1990 Unspecified osteoarthritis, unspecified site: Secondary | ICD-10-CM

## 2013-12-08 DIAGNOSIS — D638 Anemia in other chronic diseases classified elsewhere: Secondary | ICD-10-CM | POA: Diagnosis present

## 2013-12-08 DIAGNOSIS — I10 Essential (primary) hypertension: Secondary | ICD-10-CM | POA: Diagnosis present

## 2013-12-08 DIAGNOSIS — Z79899 Other long term (current) drug therapy: Secondary | ICD-10-CM

## 2013-12-08 DIAGNOSIS — R627 Adult failure to thrive: Secondary | ICD-10-CM | POA: Diagnosis present

## 2013-12-08 DIAGNOSIS — Z8673 Personal history of transient ischemic attack (TIA), and cerebral infarction without residual deficits: Secondary | ICD-10-CM

## 2013-12-08 DIAGNOSIS — N259 Disorder resulting from impaired renal tubular function, unspecified: Secondary | ICD-10-CM

## 2013-12-08 DIAGNOSIS — Y92009 Unspecified place in unspecified non-institutional (private) residence as the place of occurrence of the external cause: Secondary | ICD-10-CM

## 2013-12-08 DIAGNOSIS — N184 Chronic kidney disease, stage 4 (severe): Secondary | ICD-10-CM

## 2013-12-08 DIAGNOSIS — S32509A Unspecified fracture of unspecified pubis, initial encounter for closed fracture: Principal | ICD-10-CM

## 2013-12-08 DIAGNOSIS — G459 Transient cerebral ischemic attack, unspecified: Secondary | ICD-10-CM

## 2013-12-08 DIAGNOSIS — E440 Moderate protein-calorie malnutrition: Secondary | ICD-10-CM | POA: Diagnosis present

## 2013-12-08 DIAGNOSIS — F411 Generalized anxiety disorder: Secondary | ICD-10-CM

## 2013-12-08 DIAGNOSIS — W19XXXA Unspecified fall, initial encounter: Secondary | ICD-10-CM | POA: Diagnosis present

## 2013-12-08 HISTORY — DX: Reserved for inherently not codable concepts without codable children: IMO0001

## 2013-12-08 HISTORY — DX: Procedure and treatment not carried out because of patient's decision for reasons of belief and group pressure: Z53.1

## 2013-12-08 LAB — CBC
HCT: 27.3 % — ABNORMAL LOW (ref 36.0–46.0)
Hemoglobin: 9.1 g/dL — ABNORMAL LOW (ref 12.0–15.0)
MCH: 30 pg (ref 26.0–34.0)
MCHC: 33.3 g/dL (ref 30.0–36.0)
MCV: 90.1 fL (ref 78.0–100.0)
Platelets: 312 10*3/uL (ref 150–400)
RBC: 3.03 MIL/uL — ABNORMAL LOW (ref 3.87–5.11)
RDW: 14.2 % (ref 11.5–15.5)
WBC: 8.1 10*3/uL (ref 4.0–10.5)

## 2013-12-08 LAB — BASIC METABOLIC PANEL
BUN: 39 mg/dL — ABNORMAL HIGH (ref 6–23)
CALCIUM: 8.8 mg/dL (ref 8.4–10.5)
CO2: 19 mEq/L (ref 19–32)
CREATININE: 4.29 mg/dL — AB (ref 0.50–1.10)
Chloride: 108 mEq/L (ref 96–112)
GFR calc Af Amer: 10 mL/min — ABNORMAL LOW (ref >90)
GFR, EST NON AFRICAN AMERICAN: 8 mL/min — AB (ref >90)
GLUCOSE: 132 mg/dL — AB (ref 70–99)
Potassium: 4 mEq/L (ref 3.7–5.3)
SODIUM: 145 meq/L (ref 137–147)

## 2013-12-08 LAB — URINALYSIS, ROUTINE W REFLEX MICROSCOPIC
Bilirubin Urine: NEGATIVE
GLUCOSE, UA: NEGATIVE mg/dL
Hgb urine dipstick: NEGATIVE
KETONES UR: NEGATIVE mg/dL
Leukocytes, UA: NEGATIVE
Nitrite: NEGATIVE
PROTEIN: NEGATIVE mg/dL
Specific Gravity, Urine: 1.016 (ref 1.005–1.030)
UROBILINOGEN UA: 0.2 mg/dL (ref 0.0–1.0)
pH: 5 (ref 5.0–8.0)

## 2013-12-08 MED ORDER — SODIUM CHLORIDE 0.9 % IV BOLUS (SEPSIS)
1000.0000 mL | Freq: Once | INTRAVENOUS | Status: AC
  Administered 2013-12-08: 1000 mL via INTRAVENOUS

## 2013-12-08 MED ORDER — MORPHINE SULFATE 4 MG/ML IJ SOLN
4.0000 mg | Freq: Once | INTRAMUSCULAR | Status: AC
  Administered 2013-12-08: 4 mg via INTRAVENOUS
  Filled 2013-12-08: qty 1

## 2013-12-08 NOTE — ED Notes (Signed)
Pt reports to the ED via [facility] from home. Pt does live by herself with some friends that come by and check on her. Friends were attempting to assist her to the bedside commode but were unable to r/t severe pain to the right hip. Pt has hx of stroke with right sided residuals and a right sided hip replacement. Pt had a fall several weeks ago per friends went to her PCP but had no X-rays performed. Pt has memory impairment from the stroke as well. Pain is worse with movement. Pt unable to bear weight at this time r/t pain. CMS intact. Full ROM limited by pain. Pt alert and oriented at baseline. Resp e/u and skin warm and dry.

## 2013-12-08 NOTE — ED Notes (Signed)
Pt. In xray 

## 2013-12-08 NOTE — ED Provider Notes (Signed)
CSN: [id number]     Arrival date & time 12/08/13  2003 History   First MD Initiated Contact with Patient 12/08/13 2007     Chief Complaint  Patient presents with  . Hip Pain   (Consider location/radiation/quality/duration/timing/severity/associated sxs/prior Treatment) The history is provided by the patient, the EMS personnel, medical records and a friend. No language interpreter was used.    Lindsay Shannon is a 80 y.o. female  with a hx of hypertension, CVA (07/2013), chronic kidney disease presents to the Emergency Department complaining of gradual, persistent, progressively worsening right hip pain onset 2 days ago.  Patient reports she fell this week and has had gradually worsening right hip pain since that time. She's been unable to angulate due to her hip pain for 2 days.  She reports partial hip arthroplasty of the right hip.  The patient arrives EMS and they reports that she is unable to bear weight on the right leg due to pain.  He also reports that she lives by herself.   Per EMS patient's friends report that she has memory issues secondary to the stroke, but that she is alert and oriented to baseline.  PCP: Cottie Banda   Past Medical History  Diagnosis Date  . Hypertension   . Stroke 07/2013  . Chronic kidney disease     CKD stage 4   Past Surgical History  Procedure Laterality Date  . Partial hip arthroplasty    . Replacement total knee     History reviewed. No pertinent family history. History  Substance Use Topics  . Smoking status: Never Smoker   . Smokeless tobacco: Not on file  . Alcohol Use: No   OB History   Grav Para Term Preterm Abortions TAB SAB Ect Mult Living                 Review of Systems  Constitutional: Negative for fever, diaphoresis, appetite change, fatigue and unexpected weight change.  HENT: Negative for mouth sores.   Eyes: Negative for visual disturbance.  Respiratory: Negative for cough, chest tightness, shortness of breath  and wheezing.   Cardiovascular: Negative for chest pain.  Gastrointestinal: Negative for nausea, vomiting, abdominal pain, diarrhea and constipation.  Endocrine: Negative for polydipsia, polyphagia and polyuria.  Genitourinary: Negative for dysuria, urgency, frequency and hematuria.  Musculoskeletal: Positive for arthralgias (right hip). Negative for back pain and neck stiffness.  Skin: Negative for rash.  Allergic/Immunologic: Negative for immunocompromised state.  Neurological: Negative for syncope, light-headedness and headaches.  Hematological: Does not bruise/bleed easily.  Psychiatric/Behavioral: Negative for sleep disturbance. The patient is not nervous/anxious.     Allergies  Review of patient's allergies indicates no known allergies.  Home Medications   Current Outpatient Rx  Name  Route  Sig  Dispense  Refill  . amLODipine (NORVASC) 10 MG tablet   Oral   Take 1 tablet (10 mg total) by mouth daily.         Marland Kitchen aspirin 325 MG tablet   Oral   Take 1 tablet (325 mg total) by mouth daily.         Marland Kitchen atenolol (TENORMIN) 50 MG tablet   Oral   Take 50 mg by mouth daily.         Marland Kitchen donepezil (ARICEPT) 10 MG tablet   Oral   Take 10 mg by mouth at bedtime. 1/2 tablet for 1 month then will increase to 1 tablet qhs         .  escitalopram (LEXAPRO) 10 MG tablet   Oral   Take 10 mg by mouth daily.         Marland Kitchen loratadine (CLARITIN) 10 MG tablet   Oral   Take 10 mg by mouth daily.         . meclizine (ANTIVERT) 12.5 MG tablet   Oral   Take 12.5 mg by mouth every 6 (six) hours as needed for dizziness.         . traMADol (ULTRAM) 50 MG tablet   Oral   Take 50 mg by mouth every 6 (six) hours as needed.          BP 129/71  Pulse 78  Temp(Src) 97.9 F (36.6 C) (Oral)  Resp 18  SpO2 100% Physical Exam  Nursing note and vitals reviewed. Constitutional: She appears well-developed and well-nourished. No distress.  HENT:  Head: Normocephalic and atraumatic.   Mouth/Throat: Oropharynx is clear and moist. No oropharyngeal exudate.  Eyes: Conjunctivae are normal.  Neck: Normal range of motion. Neck supple.  Full ROM without pain  Cardiovascular: Normal rate, regular rhythm, normal heart sounds and intact distal pulses.   No murmur heard. Pulmonary/Chest: Effort normal and breath sounds normal. No respiratory distress. She has no wheezes.  Abdominal: Soft. She exhibits no distension. There is no tenderness.  Musculoskeletal:  No tenderness to palpation of the spinous processes of the T-spine or L-spine No tenderness to palpation of the paraspinous muscles of the L-spine  Limited range of motion of the right hip, knee secondary to pain, full range of motion of the right ankle Full range of motion of the left hip, knee and leg  Lymphadenopathy:    She has no cervical adenopathy.  Neurological: She is alert. She has normal reflexes. Coordination normal.  Speech is clear and goal oriented, follows commands Patient alert and oriented to person and place but struggles with time - baseline per friends 5/5 strength in upper extremities bilaterally including strong and equal grip strength Decreased strength in the right leg due to pain, 5 out of 5 strength in the left leg Sensation normal to light and sharp touch Moves extremities without ataxia, coordination intact Pt unable to ambulate due to pain Mild facial droop noted at baseline.  Skin: Skin is warm and dry. No rash noted. She is not diaphoretic. No erythema.  Psychiatric: She has a normal mood and affect. Her behavior is normal.    ED Course  Procedures (including critical care time) Labs Review Labs Reviewed  CBC - Abnormal; Notable for the following:    RBC 3.03 (*)    Hemoglobin 9.1 (*)    HCT 27.3 (*)    All other components within normal limits  BASIC METABOLIC PANEL - Abnormal; Notable for the following:    Glucose, Bld 132 (*)    BUN 39 (*)    Creatinine, Ser 4.29 (*)    GFR  calc non Af Amer 8 (*)    GFR calc Af Amer 10 (*)    All other components within normal limits  URINALYSIS, ROUTINE W REFLEX MICROSCOPIC   Imaging Review Dg Pelvis 1-2 Views  12/08/2013   CLINICAL DATA:  Hip pain  EXAM: PELVIS - 1-2 VIEW  COMPARISON:  US RENAL dated 10/11/2012; CT ABDOMEN W/O CM dated 07/17/2004; DG HIP COMPLETE*R* dated 12/03/2013  FINDINGS: There is atotal right hip arthroplasty. No evidence of dislocation or fracture. There is joint space narrowing in the left hip without fracture.  There overlapping shadows  within the right inferior pubic ramus.  IMPRESSION: 1. No evidence of hip fracture dislocation.  . 2. Age indeterminate fracture of the right inferior pubic ramus.   Electronically Signed   By: Suzy Bouchard M.D.   On: 12/08/2013 21:25   Dg Femur Right  12/08/2013   CLINICAL DATA:  Hip pain  EXAM: RIGHT FEMUR - 2 VIEW  COMPARISON:  DG HIP COMPLETE*R* dated 12/03/2013; DG PELVIS 1-2 VIEWS dated 12/08/2013; DG KNEE 1-2 VIEWS*R* dated 10/30/2011; DG PELVIS 1-2 VIEWS dated 06/08/2006  FINDINGS: The right hip is located. No evidence of fracture of the right femur shaft or distally. There is a total right near arthroplasty is well as a total right hip arthroplasty.  Again noted is an age indeterminate fracture of the right inferior pubic ramus. This appears new from plain film comparison Mar 08, 2006.  IMPRESSION: 1. No evidence of right hip fracture. 2. Age-indeterminate right inferior pubic ramus fracture.   Electronically Signed   By: Suzy Bouchard M.D.   On: 12/08/2013 21:30    EKG Interpretation   None       MDM   1. Fracture of right inferior pubic ramus   2. CKD (chronic kidney disease) stage 4, GFR 15-29 ml/min   3. CVA (cerebral infarction)      Klaira N Tino presents several after fall with inability to ambulate secondary to pain.  She has had difficulty For herself because of this.  Will obtain basic labs and x-ray of right hip.  10:40 PM Patient with right  inferior pubic ramus fracture likely secondary to her fall.  Also of note patient with elevated BUN and creatinine from baseline.  The patient and friend report that she has not had much to eat or drink for the last several days due to her inability to ambulate; likely secondary to her dehydration.  Anticipate admission for pain control and rehydration.  11:25 PM Discussed with Dr. Doyle Askew who will admit.    The patient was discussed with and seen by Dr. Venora Maples who agrees with the treatment plan.    Jarrett Soho Duquan Gillooly, PA-C 12/08/13 2325

## 2013-12-08 NOTE — H&P (Addendum)
[facility] History and Physical  Lindsay Shannon NUU:[id number] DOB: 1923/11/01 DOA: 12/08/2013  Referring physician: ED physician PCP: Estill Dooms, MD   Chief Complaint: right hip pain   HPI:  Pt is 80 yo female who presents to [facility] ED with main concern of several days duration of right hip pain after she has sustained an episode of fall at home. Pt is somewhat poor historian and appears confused on occasions, no oriented to place and time. She describes pain as throbbing and mostly at the right hip area, constant and 10/10 in severity, non radiating, no specific alleviating symptoms, worse with ambulation and weight bearing. She says she has been bed bound since the event and has not been able to ambulate, feed herself or cook. Pt denies chest pain or shortness of breath, no abdominal or urinary concerns.   In ED, pt is hemodynamically stable but in mild to mod distress due to pain. Cr > 4. Xray with pubic ramus fracture. TRh asked to admit for pain control and hydration. Medical bed requested.   Assessment and Plan: Active Problems: Fall - unclear etiology and possibly from deconditioning and failure to thrive - will admit to medical floor - place on IVF, analgesia as needed - PT evaluation  - pt may need SNF level of care  Right hip pain, pubic ramus fracture  - after the fall - no right hip fracture - if pain persists will consider CT or MRI hip for further evaluation - continue analgesia as needed  Acute renal failure imposed on chronic renal failure - last Cr in 07/2013 was 2.52 - this new elevation is likely secondary to pre renal etiology - will place on IVF and repeat BMP in AM Anemia of chronic disease - last Hg 07/2103 11 - this drop is unclear at this time - will ask for FOBT and anemia panel - CBC in AM HTN - continue Norvasc as per home medical regimen   - pt does not take Atenolol as indicated on her med rec  Moderate malnutrition - poor oral intake  and FFT - nutrition consultation   Code Status: Full Family Communication: Pt at bedside Disposition Plan: Admit to medical floor    Review of Systems:  Constitutional: Negative for fever, chills. Negative for diaphoresis.  HENT: Negative for hearing loss, ear pain, nosebleeds, congestion, sore throat, neck pain, tinnitus and ear discharge.   Eyes: Negative for blurred vision, double vision, photophobia, pain, discharge and redness.  Respiratory: Negative for cough, hemoptysis, sputum production, shortness of breath, wheezing and stridor.   Cardiovascular: Negative for chest pain, palpitations, orthopnea, claudication and leg swelling.  Gastrointestinal: Negative for heartburn, constipation, blood in stool and melena.  Genitourinary: Negative for dysuria, urgency, frequency, hematuria and flank pain.  Musculoskeletal: Negative for myalgias.  Skin: Negative for itching and rash.  Neurological: Negative for tingling, tremors, sensory change, speech change, focal weakness, loss of consciousness and headaches.  Endo/Heme/Allergies: Negative for environmental allergies and polydipsia. Does not bruise/bleed easily.  Psychiatric/Behavioral: Negative for suicidal ideas. The patient is not nervous/anxious.      Past Medical History  Diagnosis Date  . Hypertension   . Stroke 07/2013  . Chronic kidney disease     CKD stage 4    Past Surgical History  Procedure Laterality Date  . Partial hip arthroplasty    . Replacement total knee      Social History:  reports that she has never smoked. She does not have any smokeless  tobacco history on file. She reports that she does not drink alcohol or use illicit drugs.  No Known Allergies  No known family medical history   Prior to Admission medications   Medication Sig Start Date End Date Taking? Authorizing Provider  amLODipine (NORVASC) 10 MG tablet Take 1 tablet (10 mg total) by mouth daily. 07/27/13  Yes Geradine Girt, DO  aspirin 325 MG  tablet Take 1 tablet (325 mg total) by mouth daily. 07/27/13  Yes Jessica U Vann, DO  atenolol (TENORMIN) 50 MG tablet Take 50 mg by mouth daily.   Yes Historical Provider, MD  donepezil (ARICEPT) 10 MG tablet Take 10 mg by mouth at bedtime. 1/2 tablet for 1 month then will increase to 1 tablet qhs   Yes Historical Provider, MD  escitalopram (LEXAPRO) 10 MG tablet Take 10 mg by mouth daily.   Yes Historical Provider, MD  loratadine (CLARITIN) 10 MG tablet Take 10 mg by mouth daily.   Yes Historical Provider, MD  meclizine (ANTIVERT) 12.5 MG tablet Take 12.5 mg by mouth every 6 (six) hours as needed for dizziness.   Yes Historical Provider, MD  traMADol (ULTRAM) 50 MG tablet Take 50 mg by mouth every 6 (six) hours as needed.   Yes Historical Provider, MD    Physical Exam: Filed Vitals:   12/08/13 2045 12/08/13 2145 12/08/13 2200 12/08/13 2215  BP: 119/68 140/86 132/74 129/71  Pulse: 73  79 78  Temp:      TempSrc:      Resp:      SpO2: 100%  100% 100%    Physical Exam  Constitutional: Appears cachectic and chronically ill.  HENT: Normocephalic. External right and left ear normal. Dry MM  Eyes: Conjunctivae and EOM are normal. PERRLA, no scleral icterus.  Neck: Normal ROM. Neck supple. No JVD. No tracheal deviation. No thyromegaly.  CVS: RRR, S1/S2 +, no murmurs, no gallops, no carotid bruit.  Pulmonary: Effort and breath sounds normal, no stridor, rhonchi, wheezes, rales.  Abdominal: Soft. BS +,  no distension, tenderness, rebound or guarding.  Musculoskeletal: Tenderness to palpation at the bilateral hip area.  Lymphadenopathy: No lymphadenopathy noted, cervical, inguinal. Neuro: Alert. Normal reflexes, muscle tone coordination. No cranial nerve deficit. Skin: Skin is warm and dry. No rash noted. Not diaphoretic. No erythema. No pallor.  Psychiatric: Normal mood and affect.   Labs on Admission:  Basic Metabolic Panel:  Recent Labs Lab 12/08/13 2040  NA 145  K 4.0  CL 108  CO2  19  GLUCOSE 132*  BUN 39*  CREATININE 4.29*  CALCIUM 8.8   CBC:  Recent Labs Lab 12/08/13 2040  WBC 8.1  HGB 9.1*  HCT 27.3*  MCV 90.1  PLT 312   Radiological Exams on Admission: Dg Pelvis 1-2 Views   12/08/2013  No evidence of hip fracture dislocation.  Age indeterminate fracture of the right inferior pubic ramus.    Dg Femur Right   12/08/2013   No evidence of right hip fracture. Age-indeterminate right inferior pubic ramus fracture.    EKG: Normal sinus rhythm, no ST/T wave changes  Faye Ramsay, MD  [facility] Pager [phone]  If 7PM-7AM, please contact night-coverage [email or web address] Password [facility] 12/08/2013, 11:24 PM

## 2013-12-09 DIAGNOSIS — I129 Hypertensive chronic kidney disease with stage 1 through stage 4 chronic kidney disease, or unspecified chronic kidney disease: Secondary | ICD-10-CM

## 2013-12-09 DIAGNOSIS — E43 Unspecified severe protein-calorie malnutrition: Secondary | ICD-10-CM | POA: Diagnosis present

## 2013-12-09 DIAGNOSIS — N179 Acute kidney failure, unspecified: Secondary | ICD-10-CM

## 2013-12-09 DIAGNOSIS — D649 Anemia, unspecified: Secondary | ICD-10-CM

## 2013-12-09 LAB — CBC
HCT: 23.6 % — ABNORMAL LOW (ref 36.0–46.0)
Hemoglobin: 7.6 g/dL — ABNORMAL LOW (ref 12.0–15.0)
MCH: 29.1 pg (ref 26.0–34.0)
MCHC: 32.2 g/dL (ref 30.0–36.0)
MCV: 90.4 fL (ref 78.0–100.0)
Platelets: 292 10*3/uL (ref 150–400)
RBC: 2.61 MIL/uL — AB (ref 3.87–5.11)
RDW: 14.4 % (ref 11.5–15.5)
WBC: 6.3 10*3/uL (ref 4.0–10.5)

## 2013-12-09 LAB — BASIC METABOLIC PANEL
BUN: 39 mg/dL — AB (ref 6–23)
CO2: 18 meq/L — AB (ref 19–32)
Calcium: 8.1 mg/dL — ABNORMAL LOW (ref 8.4–10.5)
Chloride: 115 mEq/L — ABNORMAL HIGH (ref 96–112)
Creatinine, Ser: 4.16 mg/dL — ABNORMAL HIGH (ref 0.50–1.10)
GFR calc Af Amer: 10 mL/min — ABNORMAL LOW (ref >90)
GFR, EST NON AFRICAN AMERICAN: 9 mL/min — AB (ref >90)
GLUCOSE: 90 mg/dL (ref 70–99)
Potassium: 4.4 mEq/L (ref 3.7–5.3)
SODIUM: 146 meq/L (ref 137–147)

## 2013-12-09 LAB — IRON AND TIBC
IRON: 15 ug/dL — AB (ref 42–135)
SATURATION RATIOS: 10 % — AB (ref 20–55)
TIBC: 155 ug/dL — AB (ref 250–470)
UIBC: 140 ug/dL (ref 125–400)

## 2013-12-09 LAB — FERRITIN: Ferritin: 64 ng/mL (ref 10–291)

## 2013-12-09 LAB — RETICULOCYTES
RBC.: 2.72 MIL/uL — ABNORMAL LOW (ref 3.87–5.11)
RETIC CT PCT: 1.2 % (ref 0.4–3.1)
Retic Count, Absolute: 32.6 10*3/uL (ref 19.0–186.0)

## 2013-12-09 LAB — TSH: TSH: 2.172 u[IU]/mL (ref 0.350–4.500)

## 2013-12-09 LAB — FOLATE: Folate: 10.8 ng/mL

## 2013-12-09 LAB — VITAMIN B12: Vitamin B-12: 638 pg/mL (ref 211–911)

## 2013-12-09 LAB — PRO B NATRIURETIC PEPTIDE: Pro B Natriuretic peptide (BNP): 2140 pg/mL — ABNORMAL HIGH (ref 0–450)

## 2013-12-09 MED ORDER — ONDANSETRON HCL 4 MG/2ML IJ SOLN
4.0000 mg | Freq: Four times a day (QID) | INTRAMUSCULAR | Status: DC | PRN
Start: 2013-12-09 — End: 2013-12-12

## 2013-12-09 MED ORDER — SODIUM CHLORIDE 0.9 % IV BOLUS (SEPSIS): 500.0000 mL | Freq: Once | INTRAVENOUS | Status: DC

## 2013-12-09 MED ORDER — TRAMADOL HCL 50 MG PO TABS
50.0000 mg | ORAL_TABLET | Freq: Four times a day (QID) | ORAL | Status: DC | PRN
  Administered 2013-12-09: 50 mg via ORAL
  Filled 2013-12-09: qty 1

## 2013-12-09 MED ORDER — AMLODIPINE BESYLATE 10 MG PO TABS
10.0000 mg | ORAL_TABLET | Freq: Every day | ORAL | Status: DC
  Administered 2013-12-09 – 2013-12-12 (×4): 10 mg via ORAL
  Filled 2013-12-09 (×4): qty 1

## 2013-12-09 MED ORDER — ENOXAPARIN SODIUM 30 MG/0.3ML ~~LOC~~ SOLN
30.0000 mg | SUBCUTANEOUS | Status: DC
  Administered 2013-12-09 – 2013-12-11 (×2): 30 mg via SUBCUTANEOUS
  Filled 2013-12-09 (×4): qty 0.3

## 2013-12-09 MED ORDER — ESCITALOPRAM OXALATE 10 MG PO TABS
10.0000 mg | ORAL_TABLET | Freq: Every day | ORAL | Status: DC
  Administered 2013-12-09 – 2013-12-12 (×4): 10 mg via ORAL
  Filled 2013-12-09 (×4): qty 1

## 2013-12-09 MED ORDER — ASPIRIN 325 MG PO TABS
325.0000 mg | ORAL_TABLET | Freq: Every day | ORAL | Status: DC
  Administered 2013-12-09 – 2013-12-12 (×4): 325 mg via ORAL
  Filled 2013-12-09 (×4): qty 1

## 2013-12-09 MED ORDER — SODIUM CHLORIDE 0.9 % IV SOLN
INTRAVENOUS | Status: DC
  Administered 2013-12-09: 75 mL/h via INTRAVENOUS
  Administered 2013-12-10 – 2013-12-11 (×2): via INTRAVENOUS

## 2013-12-09 MED ORDER — ONDANSETRON HCL 4 MG PO TABS: 4.0000 mg | ORAL_TABLET | Freq: Four times a day (QID) | ORAL | Status: DC | PRN

## 2013-12-09 NOTE — Progress Notes (Signed)
Spoke with patient's POA Lindsay Shannon about possible SNF placement. Lindsay Shannon states he has talked to Rande Brunt with admissions at [facility] and would like to look into placement of patient there if possible. Lindsay Shannon can be reached at [phone].

## 2013-12-09 NOTE — Progress Notes (Signed)
Clinical Social Work Department CLINICAL SOCIAL WORK PLACEMENT NOTE 12/09/2013  Patient:  ALBERTA, LENHARD  Account Number:  [id number] Admit date:  03/04/2008  Clinical Social Worker:  Blima Rich, Latanya Presser  Date/time:  12/09/2013 05:44 PM  Clinical Social Work is seeking post-discharge placement for this patient at the following level of care:   [place]   (*CSW will update this form in Epic as items are completed)   12/09/2013  Patient/family provided with [place] Department of Clinical Social Work's list of facilities offering this level of care within the geographic area requested by the patient (or if unable, by the patient's family).  12/09/2013  Patient/family informed of their freedom to choose among providers that offer the needed level of care, that participate in Medicare, Medicaid or managed care program needed by the patient, have an available bed and are willing to accept the patient.  12/09/2013  Patient/family informed of [facility]' ownership interest in [facility], as well as of the fact that they are under no obligation to receive care at this facility.  PASARR submitted to EDS on 07/26/2013 PASARR number received from [employer] on 07/26/2013  FL2 transmitted to all facilities in geographic area requested by pt/family on  12/09/2013 FL2 transmitted to all facilities within larger geographic area on   Patient informed that his/her managed care company has contracts with or will negotiate with  certain facilities, including the following:     Patient/family informed of bed offers received:   Patient chooses bed at  Physician recommends and patient chooses bed at    Patient to be transferred to  on   Patient to be transferred to facility by   The following physician request were entered in Epic:   Additional Comments:

## 2013-12-09 NOTE — Progress Notes (Signed)
Clinical Social Work Department BRIEF PSYCHOSOCIAL ASSESSMENT 12/09/2013  Patient:  Lindsay Shannon, Lindsay Shannon     Account Number:  [id number]     Admit date:  03/04/2008  Clinical Social Worker:  Rolinda Roan  Date/Time:  12/09/2013 05:38 PM  Referred by:  Physician  Date Referred:  12/08/2013 Referred for  SNF Placement   Other Referral:   Interview type:  Other - See comment Other interview type:   I completed assessment with family friend and POA Gerhard Perches.    PSYCHOSOCIAL DATA Living Status:  ALONE Admitted from facility:   Level of care:   Primary support name:  Gerhard Perches [phone] Primary support relationship to patient:  FRIEND Degree of support available:   good support.    CURRENT CONCERNS  Other Concerns:    SOCIAL WORK ASSESSMENT / PLAN Clinical Education officer, museum (CSW) contacted patient's HPOA Gerhard Perches. RN called CSW and reported that Gerhard Perches is patient's HPOA. Juanda Crumble is a family friend of the patient. Juanda Crumble reported that he would like for patient to go to [facility] for short term rehab. Juanda Crumble is agreeable to SNF search in [place].   Assessment/plan status:  Psychosocial Support/Ongoing Assessment of Needs Other assessment/ plan:   Information/referral to community resources:    PATIENT'S/FAMILY'S RESPONSE TO PLAN OF CARE: Juanda Crumble thanked CSW for contacting him and starting placement process.

## 2013-12-09 NOTE — ED Provider Notes (Signed)
Medical screening examination/treatment/procedure(s) were conducted as a shared visit with non-physician practitioner(s) and myself.  I personally evaluated the patient during the encounter.  EKG Interpretation   None      Admit for dehydration/volume depletion.  Given her right hip pain this could represent an acute right pubic rami fracture.  No operative care is necessary. admit   Hoy Morn, MD 12/09/13 0001

## 2013-12-09 NOTE — Progress Notes (Signed)
[facility] PROGRESS NOTE Interim History: 80 yo female who presents to [facility] ED with main concern of several days duration of right hip pain after she has sustained an episode of fall at home. Pt is somewhat poor historian and appears confused on occasions, no oriented to place and time. She describes pain as throbbing and mostly at the right hip area, constant and 10/10 in severity, non radiating, no specific alleviating symptoms, worse with ambulation and weight bearing   Assessment/Plan: Mechanical Fall: - unclear etiology and possibly from deconditioning. - place on IVF, analgesia as needed  - PT evaluation  - SW and case manger consult.  Right hip pain, pubic ramus fracture  - if pain persists will consider CT or MRI hip for further evaluation  - continue analgesia as needed.  Acute renal failure imposed on chronic renal failure  - last Cr in 07/2013 was 2.52  - this new elevation is likely secondary to pre renal etiology  - cont IV fluids repeat BMP in AM.  Anemia of chronic disease  - Hg 07/2103 was11.1, now down to 7, there is a component of hemodilution - this drop is unclear at this time  - will ask for FOBT pending, anemia panel, does not know when last colonoscopy. - CBC post transfusion.  HTN  - continue Norvasc as per home medical regimen  - pt does not take Atenolol as indicated on her med rec.  Moderate malnutrition  - poor oral intake and FFT     Code Status: Full  Family Communication: Pt at bedside  Disposition Plan: Admit to medical floor     Consultants:  none  Procedures:  X-ray of hip  Antibiotics:  none (indicate start date, and stop date if known)  HPI/Subjective: No compalins  Objective: Filed Vitals:   12/08/13 2315 12/09/13 0000 12/09/13 0100 12/09/13 0500  BP: 147/69 156/83 138/70 145/66  Pulse: 63  73 60  Temp:   98.6 F (37 C) 98.3 F (36.8 C)  TempSrc:   Oral Oral  Resp:   18 18  Height:   5\' 1"  (1.549 m)    Weight:   54.296 kg (119 lb 11.2 oz)   SpO2: 100%  100% 99%   No intake or output data in the 24 hours ending 12/09/13 0820 Filed Weights   12/09/13 0100  Weight: 54.296 kg (119 lb 11.2 oz)    Exam:  General: Alert, awake, oriented x3,  cachectic  HEENT: No bruits, no goiter.  Heart: Regular rate and rhythm, without murmurs, rubs, gallops.  Lungs: Good air movement, clear to auscultation. Abdomen: Soft, nontender, nondistended, positive bowel sounds.  Data Reviewed: Basic Metabolic Panel:  Recent Labs Lab 12/08/13 2040 12/09/13 0434  NA 145 146  K 4.0 4.4  CL 108 115*  CO2 19 18*  GLUCOSE 132* 90  BUN 39* 39*  CREATININE 4.29* 4.16*  CALCIUM 8.8 8.1*   Liver Function Tests: No results found for this basename: AST, ALT, ALKPHOS, BILITOT, PROT, ALBUMIN,  in the last 168 hours No results found for this basename: LIPASE, AMYLASE,  in the last 168 hours No results found for this basename: AMMONIA,  in the last 168 hours CBC:  Recent Labs Lab 12/08/13 2040 12/09/13 0434  WBC 8.1 6.3  HGB 9.1* 7.6*  HCT 27.3* 23.6*  MCV 90.1 90.4  PLT 312 292   Cardiac Enzymes: No results found for this basename: CKTOTAL, CKMB, CKMBINDEX, TROPONINI,  in the last 168 hours BNP (last  3 results)  Recent Labs  12/09/13 0430  PROBNP 2140.0*   CBG: No results found for this basename: GLUCAP,  in the last 168 hours  No results found for this or any previous visit (from the past 240 hour(s)).   Studies: Dg Pelvis 1-2 Views  12/08/2013   CLINICAL DATA:  Hip pain  EXAM: PELVIS - 1-2 VIEW  COMPARISON:  US RENAL dated 10/11/2012; CT ABDOMEN W/O CM dated 07/17/2004; DG HIP COMPLETE*R* dated 12/03/2013  FINDINGS: There is atotal right hip arthroplasty. No evidence of dislocation or fracture. There is joint space narrowing in the left hip without fracture.  There overlapping shadows within the right inferior pubic ramus.  IMPRESSION: 1. No evidence of hip fracture dislocation.  . 2. Age  indeterminate fracture of the right inferior pubic ramus.   Electronically Signed   By: Suzy Bouchard M.D.   On: 12/08/2013 21:25   Dg Femur Right  12/08/2013   CLINICAL DATA:  Hip pain  EXAM: RIGHT FEMUR - 2 VIEW  COMPARISON:  DG HIP COMPLETE*R* dated 12/03/2013; DG PELVIS 1-2 VIEWS dated 12/08/2013; DG KNEE 1-2 VIEWS*R* dated 10/30/2011; DG PELVIS 1-2 VIEWS dated 06/08/2006  FINDINGS: The right hip is located. No evidence of fracture of the right femur shaft or distally. There is a total right near arthroplasty is well as a total right hip arthroplasty.  Again noted is an age indeterminate fracture of the right inferior pubic ramus. This appears new from plain film comparison Mar 08, 2006.  IMPRESSION: 1. No evidence of right hip fracture. 2. Age-indeterminate right inferior pubic ramus fracture.   Electronically Signed   By: Suzy Bouchard M.D.   On: 12/08/2013 21:30    Scheduled Meds: . amLODipine  10 mg Oral Daily  . aspirin  325 mg Oral Daily  . enoxaparin (LOVENOX) injection  30 mg Subcutaneous Q24H  . escitalopram  10 mg Oral Daily   Continuous Infusions: . sodium chloride 75 mL/hr (12/09/13 0300)     Venetia Constable Marguarite Arbour  [facility] Pager [phone]. If 8PM-8AM, please contact night-coverage at [email or web address], password [facility] 12/09/2013, 8:20 AM  LOS: 1 day

## 2013-12-09 NOTE — Progress Notes (Signed)
Patient refuses administration of Blood and blood products due to being [employer]. MD notified and refusal form signed.

## 2013-12-09 NOTE — Evaluation (Signed)
Physical Therapy Evaluation Patient Details Name: Lindsay Shannon MRN: [id number] DOB: [birth year] Today's Date: 12/09/2013 Time: 1025-8527 PT Time Calculation (min): 18 min  PT Assessment / Plan / Recommendation History of Present Illness  Admitted post fall with R hip pain; Imaging show age-indeterminate R pubic ramus fx; previous stroke in Sept 2014 with R sided weakness  Clinical Impression  Pt admitted with above. Pt currently with functional limitations due to the deficits listed below (see PT Problem List).  Pt will benefit from skilled PT to increase their independence and safety with mobility to allow discharge to the venue listed below.   At current functional level, I'm not sure how she can manage safely at home; Will need to consider SNF and need a clear relaible picture of home situation      PT Assessment  Patient needs continued PT services    Follow Up Recommendations  SNF    Does the patient have the potential to tolerate intense rehabilitation      Barriers to Discharge Other (comment) Need mroe info re: home situation    Equipment Recommendations  Other (comment) (to be determined)    Recommendations for Other Services OT consult   Frequency Min 3X/week    Precautions / Restrictions Precautions Precautions: Fall Restrictions Weight Bearing Restrictions: Yes RLE Weight Bearing: Weight bearing as tolerated (per conversation with Dr. Aileen Fass)   Pertinent Vitals/Pain Reports "max" pain with the act of getting up to chair patient repositioned for comfort       Mobility  Bed Mobility Overal bed mobility: Needs Assistance Bed Mobility: Supine to Sit Supine to sit: Min guard General bed mobility comments: Inefficient motion, but pt able to sit up without physical assist Transfers Overall transfer level: Needs assistance Equipment used: None (attempted RW, however pt unable to hold RW with R hand) Transfers: Sit to/from [facility] Sit  to Stand: Mod assist Stand pivot transfers: Max assist General transfer comment: Pt very much wants to be able to get up on her own; dependent on momentum for sit to stand; Very minimal WBing R with standing activity; performed pivot steps to chair on right    Exercises     PT Diagnosis: Acute pain;Difficulty walking  PT Problem List: Decreased strength;Decreased range of motion;Decreased activity tolerance;Decreased balance;Decreased mobility;Decreased coordination;Decreased cognition;Decreased knowledge of use of DME;Decreased safety awareness;Decreased knowledge of precautions PT Treatment Interventions: DME instruction;Gait training;Functional mobility training;Therapeutic activities;Therapeutic exercise;Balance training;Neuromuscular re-education;Cognitive remediation;Patient/family education     PT Goals(Current goals can be found in the care plan section) Acute Rehab PT Goals Patient Stated Goal: Agreeable to getting up PT Goal Formulation: With patient Time For Goal Achievement: 12/16/13 Potential to Achieve Goals: Good  Visit Information  Last PT Received On: 12/09/13 Assistance Needed: +1 (+2 to try amb) History of Present Illness: Admitted post fall with R hip pain; Imaging show age-indeterminate R pubic ramus fx; previous stroke in Sept 2014 with R sided weakness       Prior [place] expects to be discharged to:: Unsure Living Arrangements: Alone Available Help at Discharge: Family Type of Home: House Home Access: Stairs to enter Home Layout: One level Additional Comments: Not sure that her living conditions are sustainable at this functional level; will need to consider SNF unless she has relaiable 24 hour assist Prior Function Comments: Need more information re: PLOF; not sure how reliable pt is as a historian Communication Communication: No difficulties (at times difficult to understand)    Cognition  Cognition Arousal/Alertness:  Awake/alert Behavior During Therapy: Anxious;WFL for tasks assessed/performed Overall Cognitive Status: Difficult to assess Difficult to assess due to:  (No family present to corroborate baseline)    Extremity/Trunk Assessment Upper Extremity Assessment Upper Extremity Assessment: RUE deficits/detail RUE Deficits / Details: hemi Lower Extremity Assessment Lower Extremity Assessment: Generalized weakness;RLE deficits/detail RLE Deficits / Details: Quite a lot of pain with WBing; unable to extend knee and hip in standing RLE: Unable to fully assess due to pain   Balance Balance Overall balance assessment: Needs assistance Sitting-balance support: Single extremity supported;Feet supported Sitting balance-Leahy Scale: Fair Standing balance support: Single extremity supported Standing balance-Leahy Scale: Poor Standing balance comment: Requires supportive physical assist when WBing RLE  End of Session PT - End of Session Equipment Utilized During Treatment: Gait belt Activity Tolerance: Patient tolerated treatment well Patient left: in chair;with call bell/phone within reach (preparing to eat lunch) Nurse Communication: Mobility status  GP     Quin Hoop [place], [place]  12/09/2013, 12:49 PM

## 2013-12-10 LAB — BASIC METABOLIC PANEL
BUN: 42 mg/dL — AB (ref 6–23)
CHLORIDE: 117 meq/L — AB (ref 96–112)
CO2: 17 mEq/L — ABNORMAL LOW (ref 19–32)
CREATININE: 3.77 mg/dL — AB (ref 0.50–1.10)
Calcium: 8 mg/dL — ABNORMAL LOW (ref 8.4–10.5)
GFR calc Af Amer: 11 mL/min — ABNORMAL LOW (ref >90)
GFR calc non Af Amer: 10 mL/min — ABNORMAL LOW (ref >90)
GLUCOSE: 89 mg/dL (ref 70–99)
POTASSIUM: 4.8 meq/L (ref 3.7–5.3)
Sodium: 148 mEq/L — ABNORMAL HIGH (ref 137–147)

## 2013-12-10 LAB — CBC
HEMATOCRIT: 22.9 % — AB (ref 36.0–46.0)
HEMOGLOBIN: 7.4 g/dL — AB (ref 12.0–15.0)
MCH: 29.4 pg (ref 26.0–34.0)
MCHC: 32.3 g/dL (ref 30.0–36.0)
MCV: 90.9 fL (ref 78.0–100.0)
Platelets: 257 10*3/uL (ref 150–400)
RBC: 2.52 MIL/uL — ABNORMAL LOW (ref 3.87–5.11)
RDW: 14.8 % (ref 11.5–15.5)
WBC: 6.7 10*3/uL (ref 4.0–10.5)

## 2013-12-10 MED ORDER — ENSURE PUDDING PO PUDG
1.0000 | Freq: Three times a day (TID) | ORAL | Status: DC
  Administered 2013-12-10 – 2013-12-12 (×4): 1 via ORAL

## 2013-12-10 MED ORDER — PANTOPRAZOLE SODIUM 40 MG PO TBEC
40.0000 mg | DELAYED_RELEASE_TABLET | Freq: Every day | ORAL | Status: DC
  Administered 2013-12-10 – 2013-12-12 (×3): 40 mg via ORAL
  Filled 2013-12-10 (×3): qty 1

## 2013-12-10 NOTE — Evaluation (Signed)
Occupational Therapy Evaluation and [place] Patient Details Name: Lindsay Shannon MRN: [id number] DOB: May 28, 1923 Today's Date: 12/10/2013 Time: 0109-3235 OT Time Calculation (min): 30 min  OT Assessment / Plan / Recommendation History of present illness Admitted post fall with R hip pain; Imaging show age-indeterminate R pubic ramus fx; previous stroke in Sept 2014 with R sided weakness   Clinical Impression   This 80 yo female admitted and underwent above presents to acute OT with decreased use of RUR PTA due to old CVA, increased pain in RLE with movement, decreased movement of RLE, decreased ability to bear weight through RLE, decreased sitting/standing balance all affecting pt's ability to perform her BADLs. Pt will benefit from continued OT at SNF, acute OT will sign off.    OT Assessment  All further OT needs can be met in the next venue of care    Follow Up Recommendations  SNF       Equipment Recommendations   (TBD next venue)          Precautions / Restrictions Precautions Precautions: Fall Restrictions Weight Bearing Restrictions: No   Pertinent Vitals/Pain FACES 2; RLE; repostioned    ADL  Eating/Feeding: Set up Where Assessed - Eating/Feeding: Bed level Grooming: Set up;Supervision/safety Where Assessed - Grooming: Unsupported sitting Upper Body Bathing: Minimal assistance Where Assessed - Upper Body Bathing: Unsupported sitting Lower Body Bathing: Maximal assistance Where Assessed - Lower Body Bathing: Supported sit to stand Upper Body Dressing: Maximal assistance Where Assessed - Upper Body Dressing: Unsupported sitting Lower Body Dressing: +1 Total assistance Where Assessed - Lower Body Dressing: Supported sit to Lobbyist: Minimal assistance Toilet Transfer Method: Stand pivot (to her left) Science writer:  (bed>recliner right next to bed on pt's left) Toileting - Clothing Manipulation and Hygiene: +1 Total assistance Where  Assessed - Toileting Clothing Manipulation and Hygiene: Sit to stand from 3-in-1 or toilet Equipment Used: Gait belt Transfers/Ambulation Related to ADLs: min A squat pivot    OT Diagnosis: Generalized weakness;Cognitive deficits  OT Problem List: Decreased strength;Decreased range of motion;Decreased activity tolerance;Impaired balance (sitting and/or standing);Decreased cognition;Decreased knowledge of use of DME or AE       Visit Information  Last OT Received On: 12/10/13 Assistance Needed: +2 (for ambulation) History of Present Illness: Admitted post fall with R hip pain; Imaging show age-indeterminate R pubic ramus fx; previous stroke in Sept 2014 with R sided weakness       Prior [place] expects to be discharged to:: Skilled nursing facility Communication Communication: No difficulties Dominant Hand: Right         Vision/Perception Vision - History Patient Visual Report: No change from baseline   Cognition  Cognition Arousal/Alertness: Awake/alert Behavior During Therapy: Anxious Overall Cognitive Status: No family/caregiver present to determine baseline cognitive functioning Area of Impairment: Orientation Orientation Level: Disoriented to;Place    Extremity/Trunk Assessment Upper Extremity Assessment Upper Extremity Assessment: RUE deficits/detail RUE Deficits / Details: old stroke, minimal movement, basically non functional RUE Coordination: decreased fine motor;decreased gross motor     Mobility Bed Mobility Overal bed mobility: Needs Assistance Bed Mobility: Supine to Sit Supine to sit: Mod assist Transfers Overall transfer level: Needs assistance Equipment used: None Transfers: Sit to/from [employer] Sit to Stand: Min assist Squat pivot transfers: Min assist General transfer comment: Pt kept saying, I can do this, just let me try. Don't hold onto me because you'll have me move faster than I want  to        Balance Balance Overall balance assessment: Needs assistance Sitting-balance support: Single extremity supported;Feet supported Sitting balance-Leahy Scale: Fair   End of Session OT - End of Session Equipment Utilized During Treatment: Gait belt Activity Tolerance: Patient tolerated treatment well Patient left: in chair;with call bell/phone within reach    Almon Register [phone] 12/10/2013, 1:13 PM

## 2013-12-10 NOTE — Progress Notes (Signed)
[facility] PROGRESS NOTE  RICKI VANHANDEL MPN:[id number] DOB: 1923/07/22 DOA: 12/08/2013 PCP: Hennie Duos, MD  Brief narrative: 80 yo female with past medical history of HTN who presents to [facility] ED 12/08/2013 with main concern of several days duration of right hip pain after she has sustained an episode of fall at home. Pt was found to have "age indeterminate" inferior pubic ramus fracture as identified on pelvic x ray.  Assessment/Plan:   Principal Problem: Inferior pubic ramus fracture - secondary to fall - per PT - recommendation is for SNF placement - SW to assist discharge plan - no significant pain reported this am  Active Problems; Acute on chronic renal failure  - last Cr in 07/2013 was 2.52  - this new elevation is likely secondary to pre renal etiology   - creatinine trending down, 4.29 --> 3.77 Elevated BNP - in 2000 range, likely due to CKD - stop IV fluids - patients respiratory status is stable - 2 D ECHO in 07/2013 with normal EF Anemia of chronic disease  - Hg 07/2103 was11.1, now down to 7.6, likely component of hemodilution  - repeat hemoglobin today - FOBT pending HTN  - continue Norvasc Moderate protein calorie malnutrition  - poor oral intake and FFT  - nutrition consulted   Code Status: Full  Family Communication: Friend at the bedside  Disposition Plan: to SNF in next 24-48 hours  Leisa Lenz, MD  [facility] Pager [phone]  If 7PM-7AM, please contact night-coverage [email or web address] Password [email or web address] 12/10/2013, 1:22 PM   LOS: 2 days   Consultants:  None   Procedures:  None   Antibiotics:  None   HPI/Subjective: No acute overnight events.   Objective: Filed Vitals:   12/09/13 0500 12/09/13 1358 12/09/13 2100 12/10/13 0500  BP: 145/66 127/71 127/70 135/60  Pulse: 60  72 70  Temp: 98.3 F (36.8 C) 97.6 F (36.4 C) 98.6 F (37 C) 98.4 F (36.9 C)  TempSrc: Oral Oral Oral Oral  Resp: 18 18 19 18   Height:       Weight:      SpO2: 99% 100% 99% 99%    Intake/Output Summary (Last 24 hours) at 12/10/13 1322 Last data filed at 12/10/13 0900  Gross per 24 hour  Intake   1140 ml  Output    600 ml  Net    540 ml    Exam:   General:  Pt is alert, oriented to self only, thinks she is from nursing home but her friend at the bedside confirmed she is from home  Cardiovascular: Regular rate and rhythm, S1/S2 appreciated   Respiratory: Clear to auscultation bilaterally, no wheezing, no crackles, no rhonchi  Abdomen: Soft, non tender, non distended, bowel sounds present, no guarding  Extremities: No edema, pulses DP and PT palpable bilaterally  Neuro: Grossly nonfocal  Data Reviewed: Basic Metabolic Panel:  Recent Labs Lab 12/08/13 2040 12/09/13 0434 12/10/13 0535  NA 145 146 148*  K 4.0 4.4 4.8  CL 108 115* 117*  CO2 19 18* 17*  GLUCOSE 132* 90 89  BUN 39* 39* 42*  CREATININE 4.29* 4.16* 3.77*  CALCIUM 8.8 8.1* 8.0*   Liver Function Tests: No results found for this basename: AST, ALT, ALKPHOS, BILITOT, PROT, ALBUMIN,  in the last 168 hours No results found for this basename: LIPASE, AMYLASE,  in the last 168 hours No results found for this basename: AMMONIA,  in the last 168 hours CBC:  Recent Labs Lab 12/08/13 2040  12/09/13 0434  WBC 8.1 6.3  HGB 9.1* 7.6*  HCT 27.3* 23.6*  MCV 90.1 90.4  PLT 312 292   Cardiac Enzymes: No results found for this basename: CKTOTAL, CKMB, CKMBINDEX, TROPONINI,  in the last 168 hours BNP: No components found with this basename: POCBNP,  CBG: No results found for this basename: GLUCAP,  in the last 168 hours  No results found for this or any previous visit (from the past 240 hour(s)).   Studies: Dg Pelvis 1-2 Views 12/08/2013   IMPRESSION: 1. No evidence of hip fracture dislocation.  . 2. Age indeterminate fracture of the right inferior pubic ramus.   Electronically Signed   By: Suzy Bouchard M.D.   On: 12/08/2013 21:25   Dg Femur  Right 12/08/2013    IMPRESSION: 1. No evidence of right hip fracture. 2. Age-indeterminate right inferior pubic ramus fracture.   Electronically Signed   By: Suzy Bouchard M.D.   On: 12/08/2013 21:30    Scheduled Meds: . amLODipine  10 mg Oral Daily  . aspirin  325 mg Oral Daily  . enoxaparin (LOVENOX) injection  30 mg Subcutaneous Q24H  . escitalopram  10 mg Oral Daily  . feeding supplement (ENSURE)  1 Container Oral TID BM   Continuous Infusions: . sodium chloride 75 mL/hr at 12/10/13 1107

## 2013-12-10 NOTE — Progress Notes (Signed)
INITIAL NUTRITION ASSESSMENT  DOCUMENTATION CODES Per approved criteria  -Not Applicable   INTERVENTION: Ensure Pudding po TID, each supplement provides 170 kcal and 4 grams of protein  NUTRITION DIAGNOSIS: Inadequate oral intake related to hip fracture and FTT as evidenced by meal completion <50% and 6 lb wt loss.   Goal: Pt to meet >/= 90% of their estimated nutrition needs   Monitor:  Wt, po intake, labs, acceptance of supplements  Reason for Assessment: Consult  80 y.o. female  Admitting Dx: <principal problem not specified>  ASSESSMENT: 80 yo female who presents [facility] ED [facility]/31 with main concern of several days duration of right hip pain after she has sustained an episode of fall at home. Pt is somewhat poor historian and appears confused on occasions, no oriented to place and time.  Pt was very hyper and talkative during RD visit. She reports that she has had an appetite that comes and goes. She reports her UBW as 125 lbs. Pt refused liquid oral nutritional supplements, but agreed to try ensure pudding.   Height: Ht Readings from Last 1 Encounters:  12/09/13 5\' 1"  (1.549 m)    Weight: Wt Readings from Last 1 Encounters:  12/09/13 119 lb 11.2 oz (54.296 kg)    Ideal Body Weight: 47.8 kg  % Ideal Body Weight: 114%  Wt Readings from Last 10 Encounters:  12/09/13 119 lb 11.2 oz (54.296 kg)  07/24/13 108 lb 7.5 oz (49.2 kg)  07/12/13 106 lb 11.2 oz (48.399 kg)  01/05/13 108 lb 3.2 oz (49.079 kg)  09/06/12 108 lb 4.8 oz (49.125 kg)  12/08/05 128 lb (58.06 kg)    Usual Body Weight: 125 lbs  % Usual Body Weight: 95%  BMI:  Body mass index is 22.63 kg/(m^2).  Estimated Nutritional Needs: Kcal: 1350-1600 Protein: 70-80 g Fluid: >1.6 L  Skin: WNL  Diet Order: General  EDUCATION NEEDS: -Education not appropriate at this time   Intake/Output Summary (Last 24 hours) at 12/10/13 1235 Last data filed at 12/10/13 0900  Gross per 24 hour  Intake   1380 ml   Output    600 ml  Net    780 ml    Last BM: none recorded   Labs:   Recent Labs Lab 12/08/13 2040 12/09/13 0434 12/10/13 0535  NA 145 146 148*  K 4.0 4.4 4.8  CL 108 115* 117*  CO2 19 18* 17*  BUN 39* 39* 42*  CREATININE 4.29* 4.16* 3.77*  CALCIUM 8.8 8.1* 8.0*  GLUCOSE 132* 90 89    CBG (last 3)  No results found for this basename: GLUCAP,  in the last 72 hours  Scheduled Meds: . amLODipine  10 mg Oral Daily  . aspirin  325 mg Oral Daily  . enoxaparin (LOVENOX) injection  30 mg Subcutaneous Q24H  . escitalopram  10 mg Oral Daily    Continuous Infusions: . sodium chloride 75 mL/hr at 12/10/13 1107    Past Medical History  Diagnosis Date  . Hypertension   . Stroke 07/2013  . Chronic kidney disease     CKD stage 4    Past Surgical History  Procedure Laterality Date  . Partial hip arthroplasty    . Replacement total knee      Terrace Arabia RD, LDN

## 2013-12-10 NOTE — Progress Notes (Signed)
Physical Therapy Treatment Patient Details Name: Lindsay Shannon MRN: [id number] DOB: 12/14/1922 Today's Date: 12/10/2013 Time: 5176-1607 PT Time Calculation (min): 21 min  PT Assessment / Plan / Recommendation  History of Present Illness Admitted post fall with R hip pain; Imaging show age-indeterminate R pubic ramus fx; previous stroke in Sept 2014 with R sided weakness   PT Comments   More painful today, which is significantly limiting mobility; Will try R platform RW next session Continue to recommend SNF for postacute rehab   Follow Up Recommendations  SNF     Does the patient have the potential to tolerate intense rehabilitation     Barriers to Discharge        Equipment Recommendations  Rolling walker with 5" wheels (R platform)    Recommendations for Other Services    Frequency Min 3X/week   Progress towards PT Goals Progress towards PT goals: Progressing toward goals  Plan Current plan remains appropriate    Precautions / Restrictions Precautions Precautions: Fall Restrictions RLE Weight Bearing: Weight bearing as tolerated   Pertinent Vitals/Pain 10+/10 with motion or WBing RLE patient repositioned for comfort     Mobility  Bed Mobility Overal bed mobility: Needs Assistance Bed Mobility: Rolling;Sit to Supine Rolling: Max assist Sit to supine: Total assist General bed mobility comments: More painful today; when motion initiated, pt requests to stop due to pain; she would rather do things without assist, however pain with WBing and movement makes it impossible at this time fer her to complete mobility tasks without assist; became agitated while we attempted to get her psoitioned appropriately in bed with bed pad in place Transfers Overall transfer level: Needs assistance Equipment used: 2 person hand held assist Transfers: Sit to/from Stand Sit to Stand: +2 physical assistance;Max assist General transfer comment: Pt kept saying, I can do this, just let me  try. Don't hold onto me because you'll have me move faster than I want to Ambulation/Gait Ambulation/Gait assistance: +2 physical assistance;Max assist Ambulation Distance (Feet): 2 Feet Assistive device: 2 person hand held assist General Gait Details: Small toe-heel pivots to bed on L foot; R unable to bear weight    Exercises     PT Diagnosis:    PT Problem List:   PT Treatment Interventions:     PT Goals (current goals can now be found in the care plan section) Acute Rehab PT Goals PT Goal Formulation: With patient Time For Goal Achievement: 12/16/13 Potential to Achieve Goals: Good  Visit Information  Last PT Received On: 12/10/13 Assistance Needed: +2 History of Present Illness: Admitted post fall with R hip pain; Imaging show age-indeterminate R pubic ramus fx; previous stroke in Sept 2014 with R sided weakness    Subjective Data  Subjective: Very loud today; wants to go back to bed; at times pt is quite hard to [employer] Arousal/Alertness: Awake/alert Behavior During Therapy: Anxious Overall Cognitive Status: No family/caregiver present to determine baseline cognitive functioning Area of Impairment: Orientation;Safety/judgement Orientation Level: Disoriented to;Place Safety/Judgement: Decreased awareness of safety;Decreased awareness of deficits    Balance  Balance Overall balance assessment: Needs assistance Sitting-balance support: Single extremity supported;Feet supported Sitting balance-Leahy Scale: Fair Standing balance support: Single extremity supported Standing balance-Leahy Scale: Zero  End of Session PT - End of Session Equipment Utilized During Treatment: Gait belt Activity Tolerance: Patient limited by pain;Treatment limited secondary to agitation Patient left: in bed;with call bell/phone within reach;with bed alarm set Nurse Communication: Mobility status  GP     Roney Marion [place], [place]  12/10/2013, 8:35 PM

## 2013-12-10 NOTE — Progress Notes (Signed)
Utilization review completed.  

## 2013-12-10 NOTE — Progress Notes (Signed)
Rehab Admissions Coordinator Note:  Patient was screened by Retta Diones for appropriateness for an Inpatient Acute Rehab Consult.  At this time, we are recommending [place].  Noted patient wants to go to [facility].  Jodell Cipro M 12/10/2013, 8:40 AM  I can be reached at [phone].

## 2013-12-11 DIAGNOSIS — R413 Other amnesia: Secondary | ICD-10-CM

## 2013-12-11 NOTE — Progress Notes (Signed)
Physical Therapy Treatment Note  Better progress today, with improved ability to participate, htough still in pain  Did not rate pain, but clearly in pain with R LE WBing patient repositioned for comfort   12/11/13 1600  PT Visit Information  Last PT Received On 12/11/13  Assistance Needed +2  History of Present Illness Admitted post fall with R hip pain; Imaging show age-indeterminate R pubic ramus fx; previous stroke in Sept 2014 with R sided weakness  PT Time Calculation  PT Start Time 1418  PT Stop Time 1443  PT Time Calculation (min) 25 min  Subjective Data  Subjective Agreeable to getting OOB  Precautions  Precautions Fall  Restrictions  RLE Weight Bearing WBAT  Cognition  Arousal/Alertness Awake/alert  Behavior During Therapy Restless  Overall Cognitive Status No family/caregiver present to determine baseline cognitive functioning  Area of Impairment Safety/judgement  Safety/Judgement Decreased awareness of deficits;Decreased awareness of safety  General Comments Very much wants to do everything herself  Bed Mobility  Overal bed mobility Needs Assistance  Bed Mobility Supine to Sit  Supine to sit Min assist  General bed mobility comments Moves very slowly, but requiring much less assist than yesterday's session; pulls up to sit with handheld assist once feet are cleared from bed  Transfers  Overall transfer level Needs assistance  Equipment used Right platform walker  Transfers Sit to/from Stand  Sit to Stand +2 physical assistance;Mod assist  General transfer comment Able to stand with mod assist with most of her weight on LLE; more controlled than yesterday's session  Ambulation/Gait  Ambulation/Gait assistance +2 physical assistance;Max assist  Ambulation Distance (Feet) 5 Feet  Assistive device Right platform walker  Gait Pattern/deviations Decreased stance time - right;Decreased step length - left  General Gait Details able to take straightforward steps today,  though needing total support when stepping Left foot and in R stance; Tried R platform, but this RW was too tall for her; will attempt next session with shorter RW  PT - End of Session  Equipment Utilized During Treatment Gait belt  Activity Tolerance Patient tolerated treatment well;Patient limited by pain  Patient left in chair;with call bell/phone within reach;with nursing/sitter in room  Nurse Communication Mobility status  PT - Assessment/Plan  PT Plan Current plan remains appropriate  PT Frequency Min 3X/week  Follow Up Recommendations SNF  PT equipment Rolling walker with 5" wheels;3in1 (PT) (short R platform)  PT Goal Progression  Progress towards PT goals Progressing toward goals  Acute Rehab PT Goals  PT Goal Formulation With patient  Time For Goal Achievement 12/16/13  Potential to Achieve Goals Good  PT General Charges  $$ ACUTE PT VISIT 1 Procedure  PT Treatments  $Gait Training 8-22 mins  $Therapeutic Activity 8-22 mins   [place], [place]

## 2013-12-11 NOTE — Progress Notes (Signed)
[facility] PROGRESS NOTE  Lindsay Shannon NIO:[id number] DOB: [birth year] DOA: 12/08/2013 PCP: Hennie Duos, MD  Brief narrative: 80 yo female with past medical history of HTN who presents to [facility] ED 12/08/2013 with main concern of several days duration of right hip pain after she has sustained an episode of fall at home. Pt was found to have "age indeterminate" inferior pubic ramus fracture as identified on pelvic x ray.   Assessment/Plan:   Principal Problem:  Inferior pubic ramus fracture  - secondary to fall  - per PT - recommendation is for SNF placement  - no significant pain reported  Active Problems;  Acute on chronic renal failure  - last Cr in 07/2013 was 2.52  - this new elevation is likely secondary to pre renal etiology  - creatinine trending down, 4.29 --> 3.77  - will get BMP in am and if trending down than pt most likely can be discharge to SNF in am Elevated BNP - in 2000 range, likely due to CKD - patients respiratory status is stable  - 2 D ECHO in 07/2013 with normal EF  - does not seem to be in resp distress Anemia of chronic disease  - Hg 07/2103 was11.1, now down to 7.6, 7.4. She is a Acupuncturist witness as confirmed by her family at the bedside. Declined transfusion  HTN  - continue Norvasc  Moderate protein calorie malnutrition  - poor oral intake and FFT  - nutrition consulted   Code Status: Full  Family Communication: Family at the bedside  Disposition Plan: to SNF in next 24-48 hours   Consultants:  None  Procedures:  None  Antibiotics:  None  Leisa Lenz, MD  [facility] Pager [phone]  If 7PM-7AM, please contact night-coverage [email or web address] Password [email or web address] 12/11/2013, 1:43 PM   LOS: 3 days   HPI/Subjective: No overnight events.  Objective: Filed Vitals:   12/10/13 2105 12/11/13 0559 12/11/13 1230 12/11/13 1300  BP: 139/59 141/63 141/70 130/84  Pulse: 69 75  74  Temp: 98.8 F (37.1 C) 98.7 F (37.1 C)  98.2 F  (36.8 C)  TempSrc: Oral Oral  Oral  Resp: 18 18  18   Height:      Weight:      SpO2: 98% 99%  100%    Intake/Output Summary (Last 24 hours) at 12/11/13 1343 Last data filed at 12/11/13 1300  Gross per 24 hour  Intake 1640.67 ml  Output   2350 ml  Net -709.33 ml    Exam:  General: Pt is alert, oriented to self only Cardiovascular: Regular rate and rhythm, S1/S2 appreciated  Respiratory: Clear to auscultation bilaterally, no wheezing, no crackles, no rhonchi  Abdomen: Soft, non tender, non distended, bowel sounds present, no guarding  Extremities: No edema, pulses DP and PT palpable bilaterally  Neuro: Grossly nonfocal   Data Reviewed: Basic Metabolic Panel:  Recent Labs Lab 12/08/13 2040 12/09/13 0434 12/10/13 0535  NA 145 146 148*  K 4.0 4.4 4.8  CL 108 115* 117*  CO2 19 18* 17*  GLUCOSE 132* 90 89  BUN 39* 39* 42*  CREATININE 4.29* 4.16* 3.77*  CALCIUM 8.8 8.1* 8.0*   Liver Function Tests: No results found for this basename: AST, ALT, ALKPHOS, BILITOT, PROT, ALBUMIN,  in the last 168 hours No results found for this basename: LIPASE, AMYLASE,  in the last 168 hours No results found for this basename: AMMONIA,  in the last 168 hours CBC:  Recent Labs Lab 12/08/13 2040 12/09/13  0434 12/10/13 1900  WBC 8.1 6.3 6.7  HGB 9.1* 7.6* 7.4*  HCT 27.3* 23.6* 22.9*  MCV 90.1 90.4 90.9  PLT 312 292 257   Cardiac Enzymes: No results found for this basename: CKTOTAL, CKMB, CKMBINDEX, TROPONINI,  in the last 168 hours BNP: No components found with this basename: POCBNP,  CBG: No results found for this basename: GLUCAP,  in the last 168 hours  No results found for this or any previous visit (from the past 240 hour(s)).   Studies: No results found.  Scheduled Meds: . amLODipine  10 mg Oral Daily  . aspirin  325 mg Oral Daily  . enoxaparin (LOVENOX) injection  30 mg Subcutaneous Q24H  . escitalopram  10 mg Oral Daily  . feeding supplement (ENSURE)  1  Container Oral TID BM  . pantoprazole  40 mg Oral Daily   Continuous Infusions: . sodium chloride 20 mL/hr at 12/10/13 1328

## 2013-12-12 ENCOUNTER — Encounter (HOSPITAL_COMMUNITY): Payer: Self-pay | Admitting: General Practice

## 2013-12-12 DIAGNOSIS — I10 Essential (primary) hypertension: Secondary | ICD-10-CM

## 2013-12-12 LAB — CBC
HCT: 22.9 % — ABNORMAL LOW (ref 36.0–46.0)
Hemoglobin: 7.4 g/dL — ABNORMAL LOW (ref 12.0–15.0)
MCH: 29.1 pg (ref 26.0–34.0)
MCHC: 32.3 g/dL (ref 30.0–36.0)
MCV: 90.2 fL (ref 78.0–100.0)
Platelets: 269 10*3/uL (ref 150–400)
RBC: 2.54 MIL/uL — ABNORMAL LOW (ref 3.87–5.11)
RDW: 14.8 % (ref 11.5–15.5)
WBC: 6.1 10*3/uL (ref 4.0–10.5)

## 2013-12-12 LAB — BASIC METABOLIC PANEL
BUN: 38 mg/dL — ABNORMAL HIGH (ref 6–23)
CO2: 17 mEq/L — ABNORMAL LOW (ref 19–32)
Calcium: 8 mg/dL — ABNORMAL LOW (ref 8.4–10.5)
Chloride: 116 mEq/L — ABNORMAL HIGH (ref 96–112)
Creatinine, Ser: 3.24 mg/dL — ABNORMAL HIGH (ref 0.50–1.10)
GFR, EST AFRICAN AMERICAN: 14 mL/min — AB (ref >90)
GFR, EST NON AFRICAN AMERICAN: 12 mL/min — AB (ref >90)
Glucose, Bld: 84 mg/dL (ref 70–99)
POTASSIUM: 4.4 meq/L (ref 3.7–5.3)
Sodium: 147 mEq/L (ref 137–147)

## 2013-12-12 MED ORDER — INFLUENZA VAC SPLIT QUAD 0.5 ML IM SUSP: 0.5000 mL | INTRAMUSCULAR | Status: DC

## 2013-12-12 MED ORDER — PANTOPRAZOLE SODIUM 40 MG PO TBEC: 40.0000 mg | DELAYED_RELEASE_TABLET | Freq: Every day | ORAL | Status: DC

## 2013-12-12 MED ORDER — PNEUMOCOCCAL VAC POLYVALENT 25 MCG/0.5ML IJ INJ: 0.5000 mL | INJECTION | INTRAMUSCULAR | Status: DC

## 2013-12-12 MED ORDER — ACETAMINOPHEN 325 MG PO TABS: 650.0000 mg | ORAL_TABLET | Freq: Four times a day (QID) | ORAL | Status: DC | PRN

## 2013-12-12 MED ORDER — TRAMADOL HCL 50 MG PO TABS: 50.0000 mg | ORAL_TABLET | Freq: Four times a day (QID) | ORAL | Status: DC | PRN

## 2013-12-12 NOTE — Discharge Summary (Addendum)
Physician Discharge Summary  Lindsay Shannon RJJ:[id number] DOB: April 10, 1923 DOA: 12/08/2013  PCP: Hennie Duos, MD  Admit date: 12/08/2013 Discharge date: 12/12/2013  Time spent: >35 minutes  Recommendations for Outpatient Follow-up:  F/u with PCP in 1-2 weeks  Discharge Diagnoses:  Active Problems:   HYPERTENSION   CKD (chronic kidney disease) stage 4, GFR 15-29 ml/min   Pubic ramus fracture   AKI (acute kidney injury)   Protein-calorie malnutrition, severe   Normocytic anemia   Discharge Condition: stable   Diet recommendation: heart healthy   Filed Weights   12/09/13 0100  Weight: 54.296 kg (119 lb 11.2 oz)    Patient is jehovah witness; no BLOOD TRaNSFUSION   History of present illness:  Brief narrative:  80 yo female with past medical history of HTN who presents to [facility] ED 12/08/2013 with main concern of several days duration of right hip pain after she has sustained an episode of fall at home. Pt was found to have "age indeterminate" inferior pubic ramus fracture as identified on pelvic x ray.   Hospital Course:  Assessment/Plan:  Principal Problem:  Inferior pubic ramus fracture  - secondary to fall  - per PT - recommendation is for SNF placement  - no significant pain reported  Active Problems;  Acute on chronic renal failure  - stable, Cr improving; cont outpatient monitoring  Elevated BNP - in 2000 range, likely due to CKD  - patients respiratory status is stable  - 2 D ECHO in 07/2013 with normal EF  - does not seem to be in resp distress  Anemia of chronic disease  - Hg 07/2103 was11.1, now down to 7.6, 7.4. She is a Acupuncturist witness as confirmed by her family at the bedside. Declined transfusion  HTN  - continue Norvasc  Moderate protein calorie malnutrition  - poor oral intake and FFT  - nutrition consulted  dementia; cont monitoring confusion at baseline   D/w patient, updated her [name] Shannon,Lindsay Shannon [phone] -all questions  answered  Procedures:  None  (i.e. Studies not automatically included, echos, thoracentesis, etc; not x-rays)  Consultations:  None   Discharge Exam: Filed Vitals:   12/12/13 0443  BP: 138/68  Pulse: 72  Temp: 98.6 F (37 C)  Resp: 20    General: alert, but confused at baseline  Cardiovascular: s1,s2 rrr Respiratory: CTA BL  Discharge Instructions  Discharge Orders   Future Appointments Provider Department Dept Phone   01/09/2014 1:00 PM [facility] [phone]   01/09/2014 1:30 PM Wyatt Portela, MD [facility] [phone]   Future Orders Complete By Expires   Diet - low sodium heart healthy  As directed    Discharge instructions  As directed    Comments:     Please follow up with primary care doctor in 1 week   Increase activity slowly  As directed        Medication List         acetaminophen 325 MG tablet  Commonly known as:  TYLENOL  Take 2 tablets (650 mg total) by mouth every 6 (six) hours as needed for mild pain.     amLODipine 10 MG tablet  Commonly known as:  NORVASC  Take 1 tablet (10 mg total) by mouth daily.     aspirin 325 MG tablet  Take 1 tablet (325 mg total) by mouth daily.     atenolol 50 MG tablet  Commonly known  as:  TENORMIN  Take 50 mg by mouth daily.     donepezil 10 MG tablet  Commonly known as:  ARICEPT  Take 10 mg by mouth at bedtime. 1/2 tablet for 1 month then will increase to 1 tablet qhs     escitalopram 10 MG tablet  Commonly known as:  LEXAPRO  Take 10 mg by mouth daily.     loratadine 10 MG tablet  Commonly known as:  CLARITIN  Take 10 mg by mouth daily.     meclizine 12.5 MG tablet  Commonly known as:  ANTIVERT  Take 12.5 mg by mouth every 6 (six) hours as needed for dizziness.     pantoprazole 40 MG tablet  Commonly known as:  PROTONIX  Take 1 tablet (40 mg total) by mouth daily.     traMADol 50 MG tablet  Commonly known as:   ULTRAM  Take 1 tablet (50 mg total) by mouth every 6 (six) hours as needed.       No Known Allergies     Follow-up Information   Follow up with Hennie Duos, MD In 1 week.   Specialty:  Internal Medicine   Contact information:   [place] [id number] [phone]        The results of significant diagnostics from this hospitalization (including imaging, microbiology, ancillary and laboratory) are listed below for reference.    Significant Diagnostic Studies: Dg Pelvis 1-2 Views  12/08/2013   CLINICAL DATA:  Hip pain  EXAM: PELVIS - 1-2 VIEW  COMPARISON:  US RENAL dated 10/11/2012; CT ABDOMEN W/O CM dated 07/17/2004; DG HIP COMPLETE*R* dated 12/03/2013  FINDINGS: There is atotal right hip arthroplasty. No evidence of dislocation or fracture. There is joint space narrowing in the left hip without fracture.  There overlapping shadows within the right inferior pubic ramus.  IMPRESSION: 1. No evidence of hip fracture dislocation.  . 2. Age indeterminate fracture of the right inferior pubic ramus.   Electronically Signed   By: Suzy Bouchard M.D.   On: 12/08/2013 21:25   Dg Hip Complete Right  12/03/2013   CLINICAL DATA:  Pain  EXAM: RIGHT HIP - COMPLETE 2+ VIEW  COMPARISON:  None.  FINDINGS: The bones are osteopenic. There is no evidence of acute fracture or dislocation. The patient status post right hip arthroplasty. Hardware appears intact without evidence of loosening or failure. Atherosclerotic calcifications are identified.  IMPRESSION: No evidence of acute osseous abnormalities. The patient's hip prostheses appears intact.   Electronically Signed   By: Margaree Mackintosh M.D.   On: 12/03/2013 18:02   Dg Femur Right  12/08/2013   CLINICAL DATA:  Hip pain  EXAM: RIGHT FEMUR - 2 VIEW  COMPARISON:  DG HIP COMPLETE*R* dated 12/03/2013; DG PELVIS 1-2 VIEWS dated 12/08/2013; DG KNEE 1-2 VIEWS*R* dated 10/30/2011; DG PELVIS 1-2 VIEWS dated 06/08/2006  FINDINGS: The right hip is  located. No evidence of fracture of the right femur shaft or distally. There is a total right near arthroplasty is well as a total right hip arthroplasty.  Again noted is an age indeterminate fracture of the right inferior pubic ramus. This appears new from plain film comparison Mar 08, 2006.  IMPRESSION: 1. No evidence of right hip fracture. 2. Age-indeterminate right inferior pubic ramus fracture.   Electronically Signed   By: Suzy Bouchard M.D.   On: 12/08/2013 21:30    Microbiology: No results found for this or any previous visit (from the past  240 hour(s)).   Labs: Basic Metabolic Panel:  Recent Labs Lab 12/08/13 2040 12/09/13 0434 12/10/13 0535 12/12/13 0650  NA 145 146 148* 147  K 4.0 4.4 4.8 4.4  CL 108 115* 117* 116*  CO2 19 18* 17* 17*  GLUCOSE 132* 90 89 84  BUN 39* 39* 42* 38*  CREATININE 4.29* 4.16* 3.77* 3.24*  CALCIUM 8.8 8.1* 8.0* 8.0*   Liver Function Tests: No results found for this basename: AST, ALT, ALKPHOS, BILITOT, PROT, ALBUMIN,  in the last 168 hours No results found for this basename: LIPASE, AMYLASE,  in the last 168 hours No results found for this basename: AMMONIA,  in the last 168 hours CBC:  Recent Labs Lab 12/08/13 2040 12/09/13 0434 12/10/13 1900 12/12/13 0650  WBC 8.1 6.3 6.7 6.1  HGB 9.1* 7.6* 7.4* 7.4*  HCT 27.3* 23.6* 22.9* 22.9*  MCV 90.1 90.4 90.9 90.2  PLT 312 292 257 269   Cardiac Enzymes: No results found for this basename: CKTOTAL, CKMB, CKMBINDEX, TROPONINI,  in the last 168 hours BNP: BNP (last 3 results)  Recent Labs  12/09/13 0430  PROBNP 2140.0*   CBG: No results found for this basename: GLUCAP,  in the last 168 hours     Signed:  Kinnie Feil  [facility] 12/12/2013, 9:46 AM

## 2013-12-13 ENCOUNTER — Other Ambulatory Visit: Payer: Self-pay | Admitting: *Deleted

## 2013-12-13 ENCOUNTER — Encounter: Payer: Self-pay | Admitting: Internal Medicine

## 2013-12-13 ENCOUNTER — Non-Acute Institutional Stay (SKILLED_NURSING_FACILITY): Payer: Medicare Other | Admitting: Internal Medicine

## 2013-12-13 DIAGNOSIS — S32599A Other specified fracture of unspecified pubis, initial encounter for closed fracture: Secondary | ICD-10-CM

## 2013-12-13 DIAGNOSIS — N039 Chronic nephritic syndrome with unspecified morphologic changes: Secondary | ICD-10-CM

## 2013-12-13 DIAGNOSIS — N189 Chronic kidney disease, unspecified: Secondary | ICD-10-CM

## 2013-12-13 DIAGNOSIS — S32509A Unspecified fracture of unspecified pubis, initial encounter for closed fracture: Secondary | ICD-10-CM

## 2013-12-13 DIAGNOSIS — I150 Renovascular hypertension: Secondary | ICD-10-CM

## 2013-12-13 DIAGNOSIS — D631 Anemia in chronic kidney disease: Secondary | ICD-10-CM

## 2013-12-13 DIAGNOSIS — N184 Chronic kidney disease, stage 4 (severe): Secondary | ICD-10-CM

## 2013-12-13 MED ORDER — TRAMADOL HCL 50 MG PO TABS: 50.0000 mg | ORAL_TABLET | Freq: Four times a day (QID) | ORAL | Status: DC | PRN

## 2013-12-17 ENCOUNTER — Ambulatory Visit: Payer: Medicare Other | Admitting: Infectious Diseases

## 2013-12-18 ENCOUNTER — Non-Acute Institutional Stay (SKILLED_NURSING_FACILITY): Payer: Medicare Other | Admitting: Internal Medicine

## 2013-12-18 DIAGNOSIS — N039 Chronic nephritic syndrome with unspecified morphologic changes: Secondary | ICD-10-CM

## 2013-12-18 DIAGNOSIS — N184 Chronic kidney disease, stage 4 (severe): Secondary | ICD-10-CM

## 2013-12-18 DIAGNOSIS — N189 Chronic kidney disease, unspecified: Secondary | ICD-10-CM

## 2013-12-18 DIAGNOSIS — D631 Anemia in chronic kidney disease: Secondary | ICD-10-CM | POA: Insufficient documentation

## 2013-12-18 NOTE — Progress Notes (Signed)
         PROGRESS NOTE  DATE: 12/18/2013  FACILITY:  [facility]  LEVEL OF CARE: SNF (31)  Acute Visit  CHIEF COMPLAINT:  Manage anemia of chronic kidney disease and chronic kidney disease stage IV  HISTORY OF PRESENT ILLNESS: I was requested by the staff to assess the patient regarding above problem(s):  ANEMIA: The anemia has been stable. The patient denies fatigue, melena or hematochezia. On 12-14-13 hemoglobin 8.3, MCV 90. On 12-10-13 hemoglobin 7.4.  CHRONIC KIDNEY DISEASE: The patient's chronic kidney disease remains stable.  Patient denies increasing lower extremity swelling or confusion. Last BUN and creatinine are: 31, 3.03.  12-10-13 BUN 42, creatinine 3.77.  PAST MEDICAL HISTORY : Reviewed.  No changes.  CURRENT MEDICATIONS: Reviewed per [facility]  REVIEW OF SYSTEMS:  GENERAL: no change in appetite, no fatigue, no weight changes, no fever, chills or weakness RESPIRATORY: no cough, SOB, DOE,, wheezing, hemoptysis CARDIAC: no chest pain, edema or palpitations GI: no abdominal pain, diarrhea, constipation, heart burn, nausea or vomiting  PHYSICAL EXAMINATION  GENERAL: no acute distress, normal body habitus NECK: supple, trachea midline, no neck masses, no thyroid tenderness, no thyromegaly RESPIRATORY: breathing is even & unlabored, BS CTAB CARDIAC: RRR, no murmur,no extra heart sounds, no edema GI: abdomen soft, normal BS, no masses, no tenderness, no hepatomegaly, no splenomegaly PSYCHIATRIC: the patient is alert & oriented to person, affect & behavior appropriate  LABS/RADIOLOGY: See history of present illness  ASSESSMENT/PLAN:  Anemia of chronic kidney disease-hemoglobin improved Chronic kidney disease stage IV-renal functions improved  CPT CODE: 62563

## 2013-12-22 DIAGNOSIS — I150 Renovascular hypertension: Secondary | ICD-10-CM | POA: Insufficient documentation

## 2013-12-22 NOTE — Progress Notes (Signed)
HISTORY & PHYSICAL  DATE: 12/13/2013   FACILITY: [facility]  LEVEL OF CARE: SNF (31)  ALLERGIES:  No Known Allergies  CHIEF COMPLAINT:  Manage pubic ramus fracture, hypertension, chronic kidney disease stage IV  HISTORY OF PRESENT ILLNESS: Patient is an 80 year old African American female.  PUBIC RAMUS FRACTURE: The patient had a fall and sustained a pubic ramus fracture. Conservative management was recommended by orthopedic surgery. Patient is admitted to this facility for short-term rehabilitation. The patient denies ongoing pain. No complications reported from the pain medication(s) currently being used.  HTN: Pt 's HTN remains stable.  Denies CP, sob, DOE, pedal edema, headaches, dizziness or visual disturbances.  No complications from the medications currently being used.  Last BP : 160/94.  CHRONIC KIDNEY DISEASE: The patient's chronic kidney disease remains stable.  Patient denies increasing lower extremity swelling or confusion. Last BUN and creatinine are: 38 and 3.24.  PAST MEDICAL HISTORY :  Past Medical History  Diagnosis Date  . Hypertension   . Stroke 07/2013  . Chronic kidney disease     CKD stage 4  . Refusal of blood transfusions as patient is Jehovah's Witness     PAST SURGICAL HISTORY: Past Surgical History  Procedure Laterality Date  . Partial hip arthroplasty    . Replacement total knee      SOCIAL HISTORY:  reports that she has never smoked. She has never used smokeless tobacco. She reports that she does not drink alcohol or use illicit drugs.  FAMILY HISTORY: None  CURRENT MEDICATIONS: Reviewed per MAR  REVIEW OF SYSTEMS:  See HPI otherwise 14 point ROS is negative.  PHYSICAL EXAMINATION  VS:  T 98.7        P 74       RR 18       BP 160/94         WT (Lb) 111  GENERAL: no acute distress, normal body habitus EYES: conjunctivae normal, sclerae normal, normal eye lids MOUTH/THROAT: lips without lesions,no lesions  in the mouth,tongue is without lesions,uvula elevates in midline NECK: supple, trachea midline, no neck masses, no thyroid tenderness, no thyromegaly LYMPHATICS: no LAN in the neck, no supraclavicular LAN RESPIRATORY: breathing is even & unlabored, BS CTAB CARDIAC: RRR, no murmur,no extra heart sounds, no edema GI:  ABDOMEN: abdomen soft, normal BS, no masses, no tenderness  LIVER/SPLEEN: no hepatomegaly, no splenomegaly MUSCULOSKELETAL: HEAD: normal to inspection & palpation BACK: no kyphosis, scoliosis or spinal processes tenderness EXTREMITIES: LEFT UPPER EXTREMITY: Moderate range of motion, normal strength & tone RIGHT UPPER EXTREMITY:  Unable to assess LEFT LOWER EXTREMITY:  Moderate range of motion, normal strength & tone RIGHT LOWER EXTREMITY:  range of motion unable to assess, normal strength & tone PSYCHIATRIC: the patient is alert & oriented to person, affect & behavior appropriate  LABS/RADIOLOGY:  Labs reviewed: Basic Metabolic Panel:  Recent Labs  12/09/13 0434 12/10/13 0535 12/12/13 0650  NA 146 148* 147  K 4.4 4.8 4.4  CL 115* 117* 116*  CO2 18* 17* 17*  GLUCOSE 90 89 84  BUN 39* 42* 38*  CREATININE 4.16* 3.77* 3.24*  CALCIUM 8.1* 8.0* 8.0*   Liver Function Tests:  Recent Labs  07/12/13 1309 07/23/13 2250  AST 16 16  ALT 8 6  ALKPHOS 103 102  BILITOT 0.35 0.2*  PROT 8.3 7.8  ALBUMIN 3.2* 3.2*   CBC:  Recent Labs  07/12/13 1308  12/09/13 0434 12/10/13  1900 12/12/13 0650  WBC 8.2  < > 6.3 6.7 6.1  NEUTROABS 5.7  --   --   --   --   HGB 10.2*  < > 7.6* 7.4* 7.4*  HCT 31.3*  < > 23.6* 22.9* 22.9*  MCV 93.8  < > 90.4 90.9 90.2  PLT 271  < > 292 257 269  < > = values in this interval not displayed.  Lipid Panel:  Recent Labs  07/25/13 0440  HDL 55   Cardiac Enzymes:  Recent Labs  07/23/13 2250  CKTOTAL 110   CBG:  Recent Labs  07/24/13 0003 07/24/13 1155 07/24/13 1633  GLUCAP 87 115* 155*   RIGHT HIP - COMPLETE 2+  VIEW   COMPARISON:  None.   FINDINGS: The bones are osteopenic. There is no evidence of acute fracture or dislocation. The patient status post right hip arthroplasty. Hardware appears intact without evidence of loosening or failure. Atherosclerotic calcifications are identified.   IMPRESSION: No evidence of acute osseous abnormalities. The patient's hip prostheses appears intact.   PELVIS - 1-2 VIEW   COMPARISON:  US RENAL dated 10/11/2012; CT ABDOMEN W/O CM dated 07/17/2004; DG HIP COMPLETE*R* dated 12/03/2013   FINDINGS: There is atotal right hip arthroplasty. No evidence of dislocation or fracture. There is joint space narrowing in the left hip without fracture.   There overlapping shadows within the right inferior pubic ramus.   IMPRESSION: 1. No evidence of hip fracture dislocation.  . 2. Age indeterminate fracture of the right inferior pubic ramus.   RIGHT FEMUR - 2 VIEW   COMPARISON:  DG HIP COMPLETE*R* dated 12/03/2013; DG PELVIS 1-2 VIEWS dated 12/08/2013; DG KNEE 1-2 VIEWS*R* dated 10/30/2011; DG PELVIS 1-2 VIEWS dated 06/08/2006   FINDINGS: The right hip is located. No evidence of fracture of the right femur shaft or distally. There is a total right near arthroplasty is well as a total right hip arthroplasty.   Again noted is an age indeterminate fracture of the right inferior pubic ramus. This appears new from plain film comparison Mar 08, 2006.   IMPRESSION: 1. No evidence of right hip fracture. 2. Age-indeterminate right inferior pubic ramus fracture.     ASSESSMENT/PLAN:  Inferior pubic ramus fracture-continue pain management and rehabilitation. Chronic kidney disease stage IV- check renal functions Renovascular hypertension-blood pressure is elevated. Will monitor. Anemia of chronic kidney disease-recheck Dementia-continue Aricept GERD-well-controlled Check CBC and BMP  I have reviewed patient's medical records received at admission/from  hospitalization.  CPT CODE: 14481

## 2013-12-24 NOTE — Progress Notes (Signed)
  This encounter was created in error - please disregard.  This encounter was created in error - please disregard.  This encounter was created in error - please disregard.  This encounter was created in error - please disregard. 

## 2014-01-07 ENCOUNTER — Non-Acute Institutional Stay (SKILLED_NURSING_FACILITY): Payer: Medicare Other | Admitting: Family

## 2014-01-07 DIAGNOSIS — N390 Urinary tract infection, site not specified: Secondary | ICD-10-CM

## 2014-01-07 NOTE — Progress Notes (Signed)
Patient ID: Lindsay Shannon, female   DOB: 11-Dec-1922, 80 y.o.   MRN: [id number]  Date: 01/07/14 Facility: [facility]   Chief Complaint  Patient presents with  . Acute Visit    Abnormal Labs    HPI: Pt presents with abnormal urinalysis with positive nitrates.  Pt denies dysuria or malodorous urine however endorses weakness and not feeling well. Pt denies N/V/D, fever, chills, flank pain, and HA. Pt and health care team denies further concerns/issues.       No Known Allergies   Medication List       This list is accurate as of: 01/07/14  7:14 PM.  Always use your most recent med list.               acetaminophen 325 MG tablet  Commonly known as:  TYLENOL  Take 2 tablets (650 mg total) by mouth every 6 (six) hours as needed for mild pain.     amLODipine 10 MG tablet  Commonly known as:  NORVASC  Take 1 tablet (10 mg total) by mouth daily.     aspirin 325 MG tablet  Take 1 tablet (325 mg total) by mouth daily.     atenolol 50 MG tablet  Commonly known as:  TENORMIN  Take 50 mg by mouth daily.     donepezil 10 MG tablet  Commonly known as:  ARICEPT  Take 10 mg by mouth at bedtime. 1/2 tablet for 1 month then will increase to 1 tablet qhs     escitalopram 10 MG tablet  Commonly known as:  LEXAPRO  Take 10 mg by mouth daily.     loratadine 10 MG tablet  Commonly known as:  CLARITIN  Take 10 mg by mouth daily.     meclizine 12.5 MG tablet  Commonly known as:  ANTIVERT  Take 12.5 mg by mouth every 6 (six) hours as needed for dizziness.     pantoprazole 40 MG tablet  Commonly known as:  PROTONIX  Take 1 tablet (40 mg total) by mouth daily.     traMADol 50 MG tablet  Commonly known as:  ULTRAM  Take 1 tablet (50 mg total) by mouth every 6 (six) hours as needed.         DATA REVIEWED   Laboratory Studies:Reviewed     Past Medical History  Diagnosis Date  . Hypertension   . Stroke 07/2013  . Chronic kidney disease     CKD stage 4  . Refusal of  blood transfusions as patient is Jehovah's Witness      Past Surgical History  Procedure Laterality Date  . Partial hip arthroplasty    . Replacement total knee       Review of Systems  Respiratory: Negative.   Cardiovascular: Positive for chest pain.  Genitourinary: Negative.   Neurological: Positive for focal weakness and weakness.     Physical Exam Filed Vitals:   01/07/14 1908  BP: 120/68  Pulse: 62  Temp: 98.3 F (36.8 C)  Resp: 18   There is no weight on file to calculate BMI. Physical Exam  Constitutional: She is oriented to person, place, and time.  Cardiovascular: Normal rate, regular rhythm and normal heart sounds.   Pulmonary/Chest: Effort normal and breath sounds normal.  Neurological: She is alert and oriented to person, place, and time.    ASSESSMENT/PLAN  UTI-Rocephin 1g IM x 5 days        Florastor po bid x 5 days Pending  urine culture    Follow up: prn

## 2014-01-09 ENCOUNTER — Other Ambulatory Visit: Payer: Medicare Other

## 2014-01-09 ENCOUNTER — Ambulatory Visit: Payer: Medicare Other | Admitting: Oncology

## 2014-01-24 ENCOUNTER — Non-Acute Institutional Stay (SKILLED_NURSING_FACILITY): Payer: Medicare Other | Admitting: Internal Medicine

## 2014-01-24 ENCOUNTER — Encounter: Payer: Self-pay | Admitting: Internal Medicine

## 2014-01-24 DIAGNOSIS — I10 Essential (primary) hypertension: Secondary | ICD-10-CM

## 2014-01-24 DIAGNOSIS — F0390 Unspecified dementia without behavioral disturbance: Secondary | ICD-10-CM

## 2014-01-24 DIAGNOSIS — H8110 Benign paroxysmal vertigo, unspecified ear: Secondary | ICD-10-CM

## 2014-01-24 DIAGNOSIS — S32599A Other specified fracture of unspecified pubis, initial encounter for closed fracture: Secondary | ICD-10-CM

## 2014-01-24 DIAGNOSIS — N039 Chronic nephritic syndrome with unspecified morphologic changes: Secondary | ICD-10-CM

## 2014-01-24 DIAGNOSIS — K219 Gastro-esophageal reflux disease without esophagitis: Secondary | ICD-10-CM

## 2014-01-24 DIAGNOSIS — N189 Chronic kidney disease, unspecified: Secondary | ICD-10-CM

## 2014-01-24 DIAGNOSIS — F0393 Unspecified dementia, unspecified severity, with mood disturbance: Secondary | ICD-10-CM | POA: Insufficient documentation

## 2014-01-24 DIAGNOSIS — R062 Wheezing: Secondary | ICD-10-CM

## 2014-01-24 DIAGNOSIS — S32509A Unspecified fracture of unspecified pubis, initial encounter for closed fracture: Secondary | ICD-10-CM

## 2014-01-24 DIAGNOSIS — D631 Anemia in chronic kidney disease: Secondary | ICD-10-CM

## 2014-01-24 DIAGNOSIS — N184 Chronic kidney disease, stage 4 (severe): Secondary | ICD-10-CM

## 2014-01-24 DIAGNOSIS — E870 Hyperosmolality and hypernatremia: Secondary | ICD-10-CM

## 2014-01-24 HISTORY — DX: Benign paroxysmal vertigo, unspecified ear: H81.10

## 2014-01-24 NOTE — Progress Notes (Signed)
Patient ID: Lindsay Shannon, female   DOB: January 26, 1923, 80 y.o.   MRN: [id number]    [facility]  Chief Complaint  Patient presents with  . Medical Managment of Chronic Issues   No Known Allergies  Code- full code  HPI 80 y/o female patient is here for rehabilitation after a fall with inferior pubic rami fracture. She has been working with PT and OT, her pain is under control. She feels congested in her chest. Denies any chest pain. No other complaints She recently was treated for uti and has completed her antibiotics on 01/12/14.  Her po intake is fair No falls reported in the facility  Review of Systems  Constitutional: Negative for fever, chills, weight loss, malaise/fatigue and diaphoresis.  HENT: Negative for hearing loss and sore throat.   Eyes: Negative for blurred vision, double vision and discharge.  Respiratory: Negative for cough, sputum production Cardiovascular: Negative for chest pain, palpitations, orthopnea and leg swelling.  Gastrointestinal: Negative for heartburn, nausea, vomiting, abdominal pain, diarrhea and constipation.  Genitourinary: Negative for dysuria and flank pain. recently completed uti rx Musculoskeletal: Negative for back pain, falls Skin: Negative for itching and rash.  Neurological: Negative for dizziness, tingling, focal weakness and headaches.  Psychiatric/Behavioral: Negative for depression and memory loss. The patient is not nervous/anxious.    Past Medical History  Diagnosis Date  . Hypertension   . Stroke 07/2013  . Chronic kidney disease     CKD stage 4  . Refusal of blood transfusions as patient is Jehovah's Witness    Past Surgical History  Procedure Laterality Date  . Partial hip arthroplasty    . Replacement total knee     Current Outpatient Prescriptions on File Prior to Visit  Medication Sig Dispense Refill  . acetaminophen (TYLENOL) 325 MG tablet Take 2 tablets (650 mg total) by mouth every 6 (six) hours as  needed for mild pain.      Marland Kitchen amLODipine (NORVASC) 10 MG tablet Take 1 tablet (10 mg total) by mouth daily.      Marland Kitchen aspirin 325 MG tablet Take 1 tablet (325 mg total) by mouth daily.      Marland Kitchen atenolol (TENORMIN) 50 MG tablet Take 50 mg by mouth daily.      Marland Kitchen donepezil (ARICEPT) 10 MG tablet Take 10 mg by mouth at bedtime. 1/2 tablet for 1 month then will increase to 1 tablet qhs      . escitalopram (LEXAPRO) 10 MG tablet Take 10 mg by mouth daily.      Marland Kitchen loratadine (CLARITIN) 10 MG tablet Take 10 mg by mouth daily.      . meclizine (ANTIVERT) 12.5 MG tablet Take 12.5 mg by mouth every 6 (six) hours as needed for dizziness.      . pantoprazole (PROTONIX) 40 MG tablet Take 1 tablet (40 mg total) by mouth daily.      . traMADol (ULTRAM) 50 MG tablet Take 1 tablet (50 mg total) by mouth every 6 (six) hours as needed.  30 tablet  0   No current facility-administered medications on file prior to visit.    Physical exam BP 120/70  Pulse 58  Temp(Src) 97.7 F (36.5 C)  Resp 18  SpO2 95%  General- elderly female in no acute distress Head- atraumatic, normocephalic Eyes- PERRLA, EOMI, no pallor, no icterus, no discharge Neck- no lymphadenopathy Mouth- normal mucus membrane, no oral thrush, normal oropharynx Chest- no chest wall deformities, no chest wall tenderness Cardiovascular-  normal s1,s2, no murmurs/ rubs/ gallops, normal distal pulses Respiratory- bilateral clear to auscultation, has wheezing present, no rhonchi, no crackles Abdomen- bowel sounds present, soft, non tender Musculoskeletal- unable to move right UE and LE, normal ROM in left side, uses a bar support to stand up, working with therapy team. no leg edema Neurological- no focal deficit Skin- warm and dry Psychiatry- alert and oriented to person, place and has normal mood and affect  Labs 01/04/14 u/a with positive nitrites, moderate bacterial growth and > 30 wbc 12/14/13 wbc 6.2, hb 8.3, hct 25.9, plt 336, bun 31, cr 3.03, na  147, k 5, cl 115   Assessment/plan  Pubic rami fracture No surgical procedure done, undergoing therapy here. Continue to work with PT and OT. Continue tylenol and tramadol for pain. Pain under control with current regimen. Fall precautions  Wheezing No cardiac symptoms at present. No cough, runny nose or sore throat. No use of accessory muscles to breath. Will have her on duoneb given her limitations with using inhaler with right arm weakness. Reassess if no improvement  HTN Stable bp readings. Continue amlodipine 10 mg daily, atenolol 50 mg daily with aspirin 325 mg daily  Anemia Likely from CKD. Recheck h/h given pt is on aspirin and last h/h 8.3/25.9  CKD Likely from long standing HTN. Avoid NSAIDs. Continue bp medication. If repeat hb < 10, pt might benefit from erythropoetin injection  Dementia with depression Stable. Continue celexa 10 mg daily with aricept for now. Encourage to be OOB. Continue Magic cup with meals  Allergic rhinitis Continue claritin for now  GERD Symptoms controlled with protonix 40 mg daily  Vertigo Continue prn meclizine and monitor clinically  Hypernatremia No acute confusion or seizure activity. Encourage hydration. Recheck bmp

## 2014-03-13 ENCOUNTER — Non-Acute Institutional Stay (SKILLED_NURSING_FACILITY): Payer: Medicare Other | Admitting: Internal Medicine

## 2014-03-13 DIAGNOSIS — F0390 Unspecified dementia without behavioral disturbance: Secondary | ICD-10-CM

## 2014-03-13 DIAGNOSIS — N039 Chronic nephritic syndrome with unspecified morphologic changes: Secondary | ICD-10-CM

## 2014-03-13 DIAGNOSIS — I150 Renovascular hypertension: Secondary | ICD-10-CM

## 2014-03-13 DIAGNOSIS — N184 Chronic kidney disease, stage 4 (severe): Secondary | ICD-10-CM

## 2014-03-13 DIAGNOSIS — D631 Anemia in chronic kidney disease: Secondary | ICD-10-CM

## 2014-03-15 DIAGNOSIS — N184 Chronic kidney disease, stage 4 (severe): Secondary | ICD-10-CM | POA: Insufficient documentation

## 2014-03-15 DIAGNOSIS — I150 Renovascular hypertension: Secondary | ICD-10-CM | POA: Insufficient documentation

## 2014-03-15 DIAGNOSIS — F0390 Unspecified dementia without behavioral disturbance: Secondary | ICD-10-CM | POA: Insufficient documentation

## 2014-03-15 DIAGNOSIS — N189 Chronic kidney disease, unspecified: Secondary | ICD-10-CM

## 2014-03-15 DIAGNOSIS — D631 Anemia in chronic kidney disease: Secondary | ICD-10-CM | POA: Insufficient documentation

## 2014-03-15 NOTE — Progress Notes (Signed)
         PROGRESS NOTE  DATE: 03/13/2014  FACILITY: Nursing Home Location: [facility]  LEVEL OF CARE: SNF (31)  Routine Visit  CHIEF COMPLAINT:  Manage hypertension, dementia and chronic kidney disease stage IV  HISTORY OF PRESENT ILLNESS:  REASSESSMENT OF ONGOING PROBLEM(S):  DEMENTIA: The dementia remaines stable and continues to function adequately in the current living environment with supervision.  The patient has had little changes in behavior. No complications noted from the medications presently being used.  CHRONIC KIDNEY DISEASE: The patient's chronic kidney disease remains stable.  Patient denies increasing lower extremity swelling or confusion. Last BUN and creatinine are: 43, 3.29  In 4-15.  HTN: Pt 's HTN remains stable.  Denies CP, sob, DOE, pedal edema, headaches, dizziness or visual disturbances.  No complications from the medications currently being used.  Last BP : 130/70.  PAST MEDICAL HISTORY : Reviewed.  No changes/see problem list  CURRENT MEDICATIONS: Reviewed per MAR/see medication list  REVIEW OF SYSTEMS:  GENERAL: no change in appetite, no fatigue, no weight changes, no fever, chills or weakness RESPIRATORY: no cough, SOB, DOE, wheezing, hemoptysis CARDIAC: no chest pain, edema or palpitations GI: no abdominal pain, diarrhea, constipation, heart burn, nausea or vomiting  PHYSICAL EXAMINATION  VS:  See VS section  GENERAL: no acute distress, normal body habitus EYES: conjunctivae normal, sclerae normal, normal eye lids NECK: supple, trachea midline, no neck masses, no thyroid tenderness, no thyromegaly LYMPHATICS: no LAN in the neck, no supraclavicular LAN RESPIRATORY: breathing is even & unlabored, BS CTAB CARDIAC: RRR, no murmur,no extra heart sounds, no edema GI: abdomen soft, normal BS, no masses, no tenderness, no hepatomegaly, no splenomegaly PSYCHIATRIC: the patient is alert & oriented to person, affect & behavior  appropriate  LABS/RADIOLOGY:  4-15 BUN 43, creatinine 3.29 otherwise BMP normal, albumin 3.3 otherwise liver profile normal 2-15 hemoglobin 8.3, MCV 90 otherwise CBC normal  ASSESSMENT/PLAN:  Renovascular hypertension-well-controlled Dementia-check TSH and vitamin B12 level Chronic kidney disease stage IV -stable Anemia of chronic kidney disease-stable Allergic rhinitis-well-controlled GERD-continue PPI Depression-stable  CPT CODE: 62694  Violette Morneault Y Seydina Holliman, MD [facility] [phone]

## 2014-04-08 ENCOUNTER — Non-Acute Institutional Stay (SKILLED_NURSING_FACILITY): Payer: Medicare Other | Admitting: Internal Medicine

## 2014-04-08 DIAGNOSIS — N184 Chronic kidney disease, stage 4 (severe): Secondary | ICD-10-CM

## 2014-04-08 DIAGNOSIS — D631 Anemia in chronic kidney disease: Secondary | ICD-10-CM

## 2014-04-08 DIAGNOSIS — F0390 Unspecified dementia without behavioral disturbance: Secondary | ICD-10-CM

## 2014-04-08 DIAGNOSIS — I150 Renovascular hypertension: Secondary | ICD-10-CM

## 2014-04-08 DIAGNOSIS — N039 Chronic nephritic syndrome with unspecified morphologic changes: Secondary | ICD-10-CM

## 2014-04-08 NOTE — Progress Notes (Signed)
         PROGRESS NOTE  DATE: 6- 1-15  FACILITY: Nursing Home Location: [facility]  LEVEL OF CARE: SNF (31)  Routine Visit  CHIEF COMPLAINT:  Manage hypertension, dementia and chronic kidney disease stage IV  HISTORY OF PRESENT ILLNESS:  REASSESSMENT OF ONGOING PROBLEM(S):  DEMENTIA: The dementia remaines stable and continues to function adequately in the current living environment with supervision.  The patient has had little changes in behavior. No complications noted from the medications presently being used.  CHRONIC KIDNEY DISEASE: The patient's chronic kidney disease remains stable.  Patient denies increasing lower extremity swelling or confusion. Last BUN and creatinine are: 43, 3.29  In 4-15.  HTN: Pt 's HTN remains stable.  Denies CP, sob, DOE, pedal edema, headaches, dizziness or visual disturbances.  No complications from the medications currently being used.  Last BP : 130/70, 142/80.  PAST MEDICAL HISTORY : Reviewed.  No changes/see problem list  CURRENT MEDICATIONS: Reviewed per MAR/see medication list  REVIEW OF SYSTEMS:  GENERAL: no change in appetite, no fatigue, no weight changes, no fever, chills or weakness RESPIRATORY: no cough, SOB, DOE, wheezing, hemoptysis CARDIAC: no chest pain, edema or palpitations GI: no abdominal pain, diarrhea, constipation, heart burn, nausea or vomiting  PHYSICAL EXAMINATION  VS:  See VS section  GENERAL: no acute distress, normal body habitus NECK: supple, trachea midline, no neck masses, no thyroid tenderness, no thyromegaly RESPIRATORY: breathing is even & unlabored, BS CTAB CARDIAC: RRR, no murmur,no extra heart sounds, no edema GI: abdomen soft, normal BS, no masses, no tenderness, no hepatomegaly, no splenomegaly PSYCHIATRIC: the patient is alert & oriented to person, affect & behavior appropriate  LABS/RADIOLOGY: 5-15 vitamin B12 level 1022, folate level 12, TSH 2.17 4-15 BUN 43, creatinine  3.29 otherwise BMP normal, albumin 3.3 otherwise liver profile normal 2-15 hemoglobin 8.3, MCV 90 otherwise CBC normal  ASSESSMENT/PLAN:  Renovascular hypertension-blood pressure borderline Dementia-continue current medications Chronic kidney disease stage IV -stable Anemia of chronic kidney disease-stable Allergic rhinitis-well-controlled GERD-continue PPI Depression-stable  CPT CODE: 65035  Gianah Batt Y Karter Hellmer, MD [employer] [phone]

## 2014-04-26 ENCOUNTER — Non-Acute Institutional Stay (SKILLED_NURSING_FACILITY): Payer: Medicare Other | Admitting: Adult Health

## 2014-04-26 ENCOUNTER — Encounter: Payer: Self-pay | Admitting: Adult Health

## 2014-04-26 DIAGNOSIS — F0390 Unspecified dementia without behavioral disturbance: Secondary | ICD-10-CM

## 2014-04-26 DIAGNOSIS — F411 Generalized anxiety disorder: Secondary | ICD-10-CM

## 2014-04-26 DIAGNOSIS — I129 Hypertensive chronic kidney disease with stage 1 through stage 4 chronic kidney disease, or unspecified chronic kidney disease: Secondary | ICD-10-CM

## 2014-04-26 DIAGNOSIS — I69959 Hemiplegia and hemiparesis following unspecified cerebrovascular disease affecting unspecified side: Secondary | ICD-10-CM

## 2014-04-26 DIAGNOSIS — K219 Gastro-esophageal reflux disease without esophagitis: Secondary | ICD-10-CM

## 2014-04-26 DIAGNOSIS — N184 Chronic kidney disease, stage 4 (severe): Secondary | ICD-10-CM

## 2014-04-26 DIAGNOSIS — I69351 Hemiplegia and hemiparesis following cerebral infarction affecting right dominant side: Secondary | ICD-10-CM

## 2014-04-26 DIAGNOSIS — I6990 Unspecified sequelae of unspecified cerebrovascular disease: Secondary | ICD-10-CM

## 2014-04-26 DIAGNOSIS — F0393 Unspecified dementia, unspecified severity, with mood disturbance: Secondary | ICD-10-CM

## 2014-04-26 NOTE — Progress Notes (Signed)
Patient ID: Lindsay Shannon, female   DOB: [birth year], 80 y.o.   MRN: [id number]     [facility]  No Known Allergies   Chief Complaint  Patient presents with  . Acute Visit    follow up transfer     HPI:  She has been transferred to this facility from another snf for long term care. There are no concerns being voiced by the nursing staff at this time.  She states that she is feeling good and is happy to be here. There are no complaints of pain and no concerns being voiced    Past Medical History  Diagnosis Date  . Hypertension   . Stroke 07/2013  . Chronic kidney disease     CKD stage 4  . Refusal of blood transfusions as patient is Jehovah's Witness   . NEOPLASM, BENIGN, STOMACH 01/30/2004    Qualifier: Diagnosis of  By: Laney Potash, Pam    . GASTRITIS 01/30/2004    Qualifier: Diagnosis of  By: Laney Potash, Pam    . TIA (transient ischemic attack) 07/24/2013  . Benign paroxysmal positional vertigo 01/24/2014    Past Surgical History  Procedure Laterality Date  . Partial hip arthroplasty    . Replacement total knee      VITAL SIGNS BP 132/69  Pulse 53  Ht 5\' 1"  (1.549 m)  Wt 118 lb (53.524 kg)  BMI 22.31 kg/m2  SpO2 95%   Patient's Medications  New Prescriptions   No medications on file  Previous Medications   ACETAMINOPHEN (TYLENOL) 325 MG TABLET    Take 2 tablets (650 mg total) by mouth every 6 (six) hours as needed for mild pain.   AMLODIPINE (NORVASC) 10 MG TABLET    Take 1 tablet (10 mg total) by mouth daily.   ASPIRIN 325 MG TABLET    Take 1 tablet (325 mg total) by mouth daily.   ATENOLOL (TENORMIN) 50 MG TABLET    Take 50 mg by mouth daily.   DONEPEZIL (ARICEPT) 10 MG TABLET    Take 10 mg by mouth at bedtime. 1/2 tablet for 1 month then will increase to 1 tablet qhs   ESCITALOPRAM (LEXAPRO) 10 MG TABLET    Take 10 mg by mouth daily.   IPRATROPIUM-ALBUTEROL (DUONEB) 0.5-2.5 (3) MG/3ML SOLN    Take 3 mLs by nebulization every 8 (eight) hours as needed.     LORATADINE (CLARITIN) 10 MG TABLET    Take 10 mg by mouth daily.   MIRTAZAPINE (REMERON) 15 MG TABLET    Take 15 mg by mouth at bedtime.  Modified Medications   Modified Medication Previous Medication   PANTOPRAZOLE (PROTONIX) 40 MG TABLET pantoprazole (PROTONIX) 40 MG tablet      Take 40 mg by mouth daily.    Take 1 tablet (40 mg total) by mouth daily.  Discontinued Medications   MECLIZINE (ANTIVERT) 12.5 MG TABLET    Take 12.5 mg by mouth every 6 (six) hours as needed for dizziness.   TRAMADOL (ULTRAM) 50 MG TABLET    Take 1 tablet (50 mg total) by mouth every 6 (six) hours as needed.    SIGNIFICANT DIAGNOSTIC EXAMS     LABS REVIEWED:   12-12-13: wbc 6.1; hgb 7.4; hct 22.9; mcv 90.2; plt 269; glucose 84; bun 38; creat 3.24; k+4.4; na++147     Review of Systems  Constitutional: Negative for malaise/fatigue.  Respiratory: Negative for cough and shortness of breath.   Cardiovascular: Negative for chest pain, palpitations  and leg swelling.  Gastrointestinal: Negative for heartburn, abdominal pain and constipation.  Musculoskeletal: Negative for joint pain and myalgias.  Skin: Negative.   Neurological: Negative for headaches.  Psychiatric/Behavioral: Negative for depression. The patient is not nervous/anxious.       Physical Exam  Constitutional: No distress.  thin  Neck: Neck supple. No JVD present.  Cardiovascular: Normal rate, regular rhythm and intact distal pulses.   Respiratory: Effort normal and breath sounds normal. No respiratory distress. She has no wheezes.  GI: Soft. Bowel sounds are normal. She exhibits no distension. There is no tenderness.  Musculoskeletal: She exhibits no edema.  Has full range of motion left extremities. Unable to move right upper extremity. Limited range to right lower extremity   Neurological: She is alert.  Skin: Skin is warm and dry. She is not diaphoretic.       ASSESSMENT/ PLAN:  1. CVA with right hemiparesis: she is  neurologically stable; will continue asa 325 mg daily will monitor   2. Hypertension: is stable will continue norvasc 10 mg daily and will monitor her status   3. Allergic rhinitis: is stable will continue claritin 10 mg daily   4. Anxiety: is emotionally stable will continue lexapro 10 mg daily and will continue remeron 15 mg nightly to help with insomnia  will monitor   5. CKD: stage IV:  Will check lab work and will monitor her status   6. Gerd: will continue protonix 40 mg daily   7. Dementia: there is no change in her status; will continue aricept 10 mg nightly will monitor   Will check cbc and cmp   Time spent with patient 50 minutes.       Ok Edwards NP [facility]  Contact [phone] Monday through Friday 8am- 5pm  After hours call [phone]

## 2014-04-28 DIAGNOSIS — K219 Gastro-esophageal reflux disease without esophagitis: Secondary | ICD-10-CM | POA: Insufficient documentation

## 2014-04-28 DIAGNOSIS — I69351 Hemiplegia and hemiparesis following cerebral infarction affecting right dominant side: Secondary | ICD-10-CM | POA: Insufficient documentation

## 2014-04-28 DIAGNOSIS — I6990 Unspecified sequelae of unspecified cerebrovascular disease: Secondary | ICD-10-CM | POA: Insufficient documentation

## 2014-04-28 DIAGNOSIS — I6930 Unspecified sequelae of cerebral infarction: Secondary | ICD-10-CM | POA: Insufficient documentation

## 2014-04-29 ENCOUNTER — Non-Acute Institutional Stay (SKILLED_NURSING_FACILITY): Payer: Medicare Other | Admitting: Internal Medicine

## 2014-04-29 ENCOUNTER — Encounter: Payer: Self-pay | Admitting: Internal Medicine

## 2014-04-29 DIAGNOSIS — J3089 Other allergic rhinitis: Secondary | ICD-10-CM | POA: Insufficient documentation

## 2014-04-29 DIAGNOSIS — F0390 Unspecified dementia without behavioral disturbance: Secondary | ICD-10-CM

## 2014-04-29 DIAGNOSIS — I69959 Hemiplegia and hemiparesis following unspecified cerebrovascular disease affecting unspecified side: Secondary | ICD-10-CM

## 2014-04-29 DIAGNOSIS — N184 Chronic kidney disease, stage 4 (severe): Secondary | ICD-10-CM

## 2014-04-29 DIAGNOSIS — K219 Gastro-esophageal reflux disease without esophagitis: Secondary | ICD-10-CM

## 2014-04-29 DIAGNOSIS — F0393 Unspecified dementia, unspecified severity, with mood disturbance: Secondary | ICD-10-CM

## 2014-04-29 DIAGNOSIS — R1310 Dysphagia, unspecified: Secondary | ICD-10-CM | POA: Insufficient documentation

## 2014-04-29 DIAGNOSIS — I69351 Hemiplegia and hemiparesis following cerebral infarction affecting right dominant side: Secondary | ICD-10-CM

## 2014-04-29 DIAGNOSIS — I150 Renovascular hypertension: Secondary | ICD-10-CM

## 2014-04-29 LAB — BASIC METABOLIC PANEL
BUN: 64 mg/dL — AB (ref 4–21)
Creatinine: 5 mg/dL — AB (ref 0.5–1.1)
Glucose: 80 mg/dL
POTASSIUM: 5.3 mmol/L (ref 3.4–5.3)
Sodium: 140 mmol/L (ref 137–147)

## 2014-04-29 LAB — HEPATIC FUNCTION PANEL
ALK PHOS: 75 U/L (ref 25–125)
ALT: 11 U/L (ref 7–35)
AST: 14 U/L (ref 13–35)
Bilirubin, Total: 0.3 mg/dL

## 2014-04-29 LAB — CBC AND DIFFERENTIAL
HCT: 26 % — AB (ref 36–46)
HEMOGLOBIN: 7.5 g/dL — AB (ref 12.0–16.0)
PLATELETS: 277 10*3/uL (ref 150–399)
WBC: 5.9 10*3/mL

## 2014-04-29 NOTE — Progress Notes (Signed)
Patient ID: Lindsay Shannon, female   DOB: 04-23-1923, 80 y.o.   MRN: [id number]     Facility: [facility]  PCP: Blanchie Serve, MD  No Known Allergies  Chief Complaint: new admission.  HPI:  80 y/o female patient is here for long term care. She was residing in another SNF prior to this. She has history of CVA with right sided hemiparesis, dementia, GERD among others. She is seen in her room today. She is in no distress and denies any complaints. She is alert and oriented. No skin concerns from staff. No acute behavioral changes. No falls reported.  Review of Systems  Constitutional: Negative for fever, chills, weight loss, malaise/fatigue and diaphoresis.  HENT: Negative for hearing loss and sore throat.   Eyes: Negative for blurred vision, double vision and discharge.  Respiratory: Negative for cough, sputum production Cardiovascular: Negative for chest pain, palpitations, orthopnea and leg swelling.  Gastrointestinal: Negative for heartburn, nausea, vomiting, abdominal pain, diarrhea and constipation.  Genitourinary: Negative for dysuria and flank pain.  Musculoskeletal: Negative for back pain, falls Skin: Negative for itching and rash.  Neurological: Negative for dizziness, tingling, focal weakness and headaches.  Psychiatric/Behavioral: Negative for depression and memory loss. The patient is not nervous/anxious.     Past Medical History  Diagnosis Date  . Hypertension   . Stroke 07/2013  . Chronic kidney disease     CKD stage 4  . Refusal of blood transfusions as patient is [employer]'s [employer]   . NEOPLASM, BENIGN, STOMACH 01/30/2004    Qualifier: Diagnosis of  By: Laney Potash, Pam    . GASTRITIS 01/30/2004    Qualifier: Diagnosis of  By: Laney Potash, Pam    . TIA (transient ischemic attack) 07/24/2013  . Benign paroxysmal positional vertigo 01/24/2014   Past Surgical History  Procedure Laterality Date  . Partial hip  arthroplasty    . Replacement total knee     Social History:   reports that she has never smoked. She has never used smokeless tobacco. She reports that she does not drink alcohol or use illicit drugs.  No family history on file.  Medications: Patient's Medications  New Prescriptions   No medications on file  Previous Medications   ACETAMINOPHEN (TYLENOL) 325 MG TABLET    Take 2 tablets (650 mg total) by mouth every 6 (six) hours as needed for mild pain.   AMLODIPINE (NORVASC) 10 MG TABLET    Take 1 tablet (10 mg total) by mouth daily.   ASPIRIN 325 MG TABLET    Take 1 tablet (325 mg total) by mouth daily.   ATENOLOL (TENORMIN) 50 MG TABLET    Take 50 mg by mouth daily.   DONEPEZIL (ARICEPT) 10 MG TABLET    Take 10 mg by mouth at bedtime. 1/2 tablet for 1 month then will increase to 1 tablet qhs   ESCITALOPRAM (LEXAPRO) 10 MG TABLET    Take 10 mg by mouth daily.   IPRATROPIUM-ALBUTEROL (DUONEB) 0.5-2.5 (3) MG/3ML SOLN    Take 3 mLs by nebulization every 8 (eight) hours as needed.   LORATADINE (CLARITIN) 10 MG TABLET    Take 10 mg by mouth daily.   MIRTAZAPINE (REMERON) 15 MG TABLET    Take 15 mg by mouth at bedtime.   PANTOPRAZOLE (PROTONIX) 40 MG TABLET    Take 40 mg by mouth daily.  Modified Medications   No medications on file  Discontinued Medications   No medications  on file     Physical Exam: Filed Vitals:   04/29/14 1044  BP: 120/64  Pulse: 78  Temp: 97.9 F (36.6 C)  Resp: 18  SpO2: 95%   General- elderly female in no acute distress Head- atraumatic, normocephalic Eyes- PERRLA, EOMI, no pallor, no icterus, no discharge Neck- no lymphadenopathy, no thyromegaly, no jugular vein distension Throat- moist mucus membrane Cardiovascular- normal s1,s2, no murmurs/ rubs/ gallops Respiratory- bilateral clear to auscultation, no wheeze, no rhonchi, no crackles, no use of accessory muscles Abdomen- bowel sounds present, soft, non tender Musculoskeletal- able to move her  left extremities, right sided hemiparesis (old), no leg edema Neurological- no focal deficit Skin- warm and dry Psychiatry- alert and oriented to person, place, normal mood and affect  Labs reviewed: Basic Metabolic Panel:  Recent Labs  12/09/13 0434 12/10/13 0535 12/12/13 0650  NA 146 148* 147  K 4.4 4.8 4.4  CL 115* 117* 116*  CO2 18* 17* 17*  GLUCOSE 90 89 84  BUN 39* 42* 38*  CREATININE 4.16* 3.77* 3.24*  CALCIUM 8.1* 8.0* 8.0*   Liver Function Tests:  Recent Labs  07/12/13 1309 07/23/13 2250  AST 16 16  ALT 8 6  ALKPHOS 103 102  BILITOT 0.35 0.2*  PROT 8.3 7.8  ALBUMIN 3.2* 3.2*   No results found for this basename: LIPASE, AMYLASE,  in the last 8760 hours No results found for this basename: AMMONIA,  in the last 8760 hours CBC:  Recent Labs  07/12/13 1308  12/09/13 0434 12/10/13 1900 12/12/13 0650  WBC 8.2  < > 6.3 6.7 6.1  NEUTROABS 5.7  --   --   --   --   HGB 10.2*  < > 7.6* 7.4* 7.4*  HCT 31.3*  < > 23.6* 22.9* 22.9*  MCV 93.8  < > 90.4 90.9 90.2  PLT 271  < > 292 257 269  < > = values in this interval not displayed. Cardiac Enzymes:  Recent Labs  07/23/13 2250  CKTOTAL 110   BNP: No components found with this basename: POCBNP,  CBG:  Recent Labs  07/24/13 0003 07/24/13 1155 07/24/13 1633  GLUCAP 87 115* 155*   01/04/14 u/a with positive nitrites, moderate bacterial growth and > 30 wbc 12/14/13 wbc 6.2, hb 8.3, hct 25.9, plt 336, bun 31, cr 3.03, na 147, k 5, cl 115  Assessment/Plan  Dysphagia On mechanical soft diet. Aspiration precautions  HTN Stable bp readings. Continue amlodipine 10 mg daily, atenolol 50 mg daily with aspirin 325 mg daily  Dementia with depression Stable. Continue celexa 10 mg daily, remeron 15 mg daily with aricept 10 mg daily for now. Encourage to be OOB. Continue Magic cup with meals. Weight monitoring and skin care  CVA With right hemiparesis. Continue bp medication and aspirin  GERD Stable on  protonix 40 mg daily. Will reassess and reduce her dosing next visit  CKD Likely from long standing HTN. Avoid NSAIDs. Continue bp medication.   Allergic rhinitis Continue claritin for now  Family/ staff Communication: reviewed care plan with patient and nursing supervisor   Goals of care: long term care   Labs/tests ordered- cbc, bmp    Blanchie Serve, MD  [facility] [phone] (Monday-Friday 8 am - 5 pm) [phone] (afterhours)

## 2014-04-30 ENCOUNTER — Non-Acute Institutional Stay (SKILLED_NURSING_FACILITY): Payer: Medicare Other | Admitting: Adult Health

## 2014-04-30 DIAGNOSIS — N184 Chronic kidney disease, stage 4 (severe): Secondary | ICD-10-CM

## 2014-04-30 DIAGNOSIS — M1990 Unspecified osteoarthritis, unspecified site: Secondary | ICD-10-CM

## 2014-04-30 DIAGNOSIS — F0390 Unspecified dementia without behavioral disturbance: Secondary | ICD-10-CM

## 2014-05-02 LAB — CBC AND DIFFERENTIAL
HEMATOCRIT: 26 % — AB (ref 36–46)
Hemoglobin: 7.9 g/dL — AB (ref 12.0–16.0)
Platelets: 263 10*3/uL (ref 150–399)
WBC: 7 10^3/mL

## 2014-05-08 DIAGNOSIS — F0390 Unspecified dementia without behavioral disturbance: Secondary | ICD-10-CM | POA: Insufficient documentation

## 2014-05-08 MED ORDER — TRAMADOL-ACETAMINOPHEN 37.5-325 MG PO TABS: 1.0000 | ORAL_TABLET | Freq: Two times a day (BID) | ORAL | Status: DC

## 2014-05-08 MED ORDER — DONEPEZIL HCL 10 MG PO TABS: 5.0000 mg | ORAL_TABLET | Freq: Every day | ORAL | Status: DC

## 2014-05-08 NOTE — Progress Notes (Signed)
Patient ID: Lindsay Shannon, female   DOB: 14-Aug-1923, 80 y.o.   MRN: [id number]     [facility]  No Known Allergies   Chief Complaint  Patient presents with  . Acute Visit    right knee pain    HPI:  She is complaining of right knee pain; she states that this pain is not new and she has had this pain for "some time". She has noticed some swelling present in her right knee. Her x-ray do not demonstrate any acute changes. Her renal function has gotten worse.    Past Medical History  Diagnosis Date  . Hypertension   . Stroke 07/2013  . Chronic kidney disease     CKD stage 4  . Refusal of blood transfusions as patient is Je[employer]'s Witness   . NEOPLASM, BENIGN, STOMACH 01/30/2004    Qualifier: Diagnosis of  By: Laney Potash, Pam    . GASTRITIS 01/30/2004    Qualifier: Diagnosis of  By: Laney Potash, Pam    . TIA (transient ischemic attack) 07/24/2013  . Benign paroxysmal positional vertigo 01/24/2014    Past Surgical History  Procedure Laterality Date  . Partial hip arthroplasty    . Replacement total knee      VITAL SIGNS BP 119/80  Pulse 79  Ht 5\' 1"  (1.549 m)  Wt 118 lb (53.524 kg)  BMI 22.31 kg/m2   Patient's Medications  New Prescriptions   No medications on file  Previous Medications   ACETAMINOPHEN (TYLENOL) 325 MG TABLET    Take 2 tablets (650 mg total) by mouth every 6 (six) hours as needed for mild pain.   AMLODIPINE (NORVASC) 10 MG TABLET    Take 1 tablet (10 mg total) by mouth daily.   ASPIRIN 325 MG TABLET    Take 1 tablet (325 mg total) by mouth daily.   ATENOLOL (TENORMIN) 50 MG TABLET    Take 50 mg by mouth daily.   DONEPEZIL (ARICEPT) 10 MG TABLET    Take 10 mg by mouth at bedtime. 1/2 tablet for 1 month then will increase to 1 tablet qhs   ESCITALOPRAM (LEXAPRO) 10 MG TABLET    Take 10 mg by mouth daily.   IPRATROPIUM-ALBUTEROL (DUONEB) 0.5-2.5 (3) MG/3ML SOLN    Take 3 mLs by nebulization every 8 (eight) hours as needed.   LORATADINE (CLARITIN)  10 MG TABLET    Take 10 mg by mouth daily.   MIRTAZAPINE (REMERON) 15 MG TABLET    Take 15 mg by mouth at bedtime.   PANTOPRAZOLE (PROTONIX) 40 MG TABLET    Take 40 mg by mouth daily.  Modified Medications   No medications on file  Discontinued Medications   No medications on file    SIGNIFICANT DIAGNOSTIC EXAMS  05-01-14: right knee x-ray: 1. Status post right knee total arthroplasty 2. No acute osseous abnormality 3. Nonspecific soft tissue swelling.    LABS REVIEWED:   12-12-13: wbc 6.1; hgb 7.4; hct 22.9; mcv 90.2; plt 269; glucose 84; bun 38; creat 3.24; k+4.4; na++147 04-29-14: wbc 5.9; hgb 7.5; hct 24.9; mcv 93.3; plt 277; glucose 80; bun 64; creat 5.0; k+5.3; na++ 140; liver normal albulmin 2.8 05-02-14: wbc 7.0; hgb 7.9; hct 25.8; mcv 93.5;plt 263      Review of Systems  Constitutional: Negative for malaise/fatigue.  Respiratory: Negative for cough and shortness of breath.   Cardiovascular: Negative for chest pain, palpitations and leg swelling.  Gastrointestinal: Negative for heartburn, abdominal pain and constipation.  Musculoskeletal: Negative for joint pain and myalgias.  Skin: Negative.   Neurological: Negative for headaches.  Psychiatric/Behavioral: Negative for depression. The patient is not nervous/anxious.       Physical Exam  Constitutional: No distress.  thin  Neck: Neck supple. No JVD present.  Cardiovascular: Normal rate, regular rhythm and intact distal pulses.   Respiratory: Effort normal and breath sounds normal. No respiratory distress. She has no wheezes.  GI: Soft. Bowel sounds are normal. She exhibits no distension. There is no tenderness.  Musculoskeletal: She exhibits no edema.  Has full range of motion left extremities. Unable to move right upper extremity. Limited range to right lower extremity; has a painful right knee to touch with mild swelling present. There is no warmth present.    Neurological: She is alert.  Skin: Skin is warm and dry.  She is not diaphoretic.       ASSESSMENT/ PLAN:  1. CKD stage IV: her renal funciton is worse; will lower her aricept to 5 mg nightly and will monitor her stauts   2. Dementia: no change in her overall status; due to her poor renal function will lower her aricept to 5 mg nightly and will monitor   3. Right knee osteoarthritic pain: will begin ultracet twice daily and will monitor her status.

## 2014-05-27 ENCOUNTER — Non-Acute Institutional Stay (SKILLED_NURSING_FACILITY): Payer: Medicare Other | Admitting: Adult Health

## 2014-05-27 DIAGNOSIS — N184 Chronic kidney disease, stage 4 (severe): Secondary | ICD-10-CM

## 2014-05-27 DIAGNOSIS — I6389 Other cerebral infarction: Secondary | ICD-10-CM

## 2014-05-27 DIAGNOSIS — F411 Generalized anxiety disorder: Secondary | ICD-10-CM

## 2014-05-27 DIAGNOSIS — F0390 Unspecified dementia without behavioral disturbance: Secondary | ICD-10-CM

## 2014-05-27 DIAGNOSIS — I69351 Hemiplegia and hemiparesis following cerebral infarction affecting right dominant side: Secondary | ICD-10-CM

## 2014-05-27 DIAGNOSIS — K219 Gastro-esophageal reflux disease without esophagitis: Secondary | ICD-10-CM

## 2014-05-27 DIAGNOSIS — I6350 Cerebral infarction due to unspecified occlusion or stenosis of unspecified cerebral artery: Secondary | ICD-10-CM

## 2014-05-27 DIAGNOSIS — I129 Hypertensive chronic kidney disease with stage 1 through stage 4 chronic kidney disease, or unspecified chronic kidney disease: Secondary | ICD-10-CM

## 2014-05-27 DIAGNOSIS — I69959 Hemiplegia and hemiparesis following unspecified cerebrovascular disease affecting unspecified side: Secondary | ICD-10-CM

## 2014-05-27 DIAGNOSIS — J3089 Other allergic rhinitis: Secondary | ICD-10-CM

## 2014-06-05 ENCOUNTER — Encounter: Payer: Self-pay | Admitting: Adult Health

## 2014-06-05 NOTE — Progress Notes (Signed)
Patient ID: Lindsay Shannon, female   DOB: 06-Mar-1923, 80 y.o.   MRN: [id number]     [facility]  No Known Allergies   Chief Complaint  Patient presents with  . Medical Management of Chronic Issues    HPI:  She is being seen for the management of her chronic illnesses. She is a long term resident of this facility with a history of cva and ckd stage iv. She is not voicing any complaints today states that she feels good. There are no concerns being voiced by the nursing staff. Overall her status remains stable.   Past Medical History  Diagnosis Date  . Hypertension   . Stroke 07/2013  . Chronic kidney disease     CKD stage 4  . Refusal of blood transfusions as patient is Je[employer]'s Witness   . NEOPLASM, BENIGN, STOMACH 01/30/2004    Qualifier: Diagnosis of  By: Laney Potash, Pam    . GASTRITIS 01/30/2004    Qualifier: Diagnosis of  By: Laney Potash, Pam    . TIA (transient ischemic attack) 07/24/2013  . Benign paroxysmal positional vertigo 01/24/2014    Past Surgical History  Procedure Laterality Date  . Partial hip arthroplasty    . Replacement total knee      VITAL SIGNS BP 138/75  Pulse 79  Ht 5\' 1"  (1.549 m)  Wt 123 lb 3.2 oz (55.883 kg)  BMI 23.29 kg/m2   Patient's Medications  New Prescriptions   No medications on file  Previous Medications   ACETAMINOPHEN (TYLENOL) 325 MG TABLET    Take 2 tablets (650 mg total) by mouth every 6 (six) hours as needed for mild pain.   AMLODIPINE (NORVASC) 10 MG TABLET    Take 1 tablet (10 mg total) by mouth daily.   ASPIRIN 325 MG TABLET    Take 1 tablet (325 mg total) by mouth daily.   ATENOLOL (TENORMIN) 50 MG TABLET    Take 50 mg by mouth daily.   DONEPEZIL (ARICEPT) 10 MG TABLET    Take 0.5 tablets (5 mg total) by mouth at bedtime. 1/2 tablet for 1 month then will increase to 1 tablet qhs   ESCITALOPRAM (LEXAPRO) 10 MG TABLET    Take 10 mg by mouth daily.   IPRATROPIUM-ALBUTEROL (DUONEB) 0.5-2.5 (3) MG/3ML SOLN    Take 3 mLs  by nebulization every 8 (eight) hours as needed.   LORATADINE (CLARITIN) 10 MG TABLET    Take 10 mg by mouth daily.   MIRTAZAPINE (REMERON) 15 MG TABLET    Take 15 mg by mouth at bedtime.   PANTOPRAZOLE (PROTONIX) 40 MG TABLET    Take 40 mg by mouth daily.   TRAMADOL-ACETAMINOPHEN (ULTRACET) 37.5-325 MG PER TABLET    Take 1 tablet by mouth 2 (two) times daily.  Modified Medications   No medications on file  Discontinued Medications   No medications on file    SIGNIFICANT DIAGNOSTIC EXAMS   LABS REVIEWED:   12-12-13: wbc 6.1; hgb 7.4; hct 22.9; mcv 90.2; plt 269; glucose 84; bun 38; creat 3.24; k+4.4; na++147 04-29-14: wbc 5.9; hgb 7.5; hct 24.9; mcv 93.3; plt 277; glucose 80; bun 64; creat 5.0; k+5.3; na++ 140; liver normal albulmin 2.8 05-02-14: wbc 7.0; hgb 7.9; hct 25.8; mcv 93.5;plt 263        Review of Systems  Constitutional: Negative for malaise/fatigue.  Respiratory: Negative for cough and shortness of breath.   Cardiovascular: Negative for chest pain, palpitations and leg swelling.  Gastrointestinal:  Negative for heartburn, abdominal pain and constipation.  Musculoskeletal: Negative for joint pain and myalgias.  Skin: Negative.   Neurological: Negative for headaches.  Psychiatric/Behavioral: Negative for depression. The patient is not nervous/anxious.       Physical Exam  Constitutional: No distress.  thin  Neck: Neck supple. No JVD present.  Cardiovascular: Normal rate, regular rhythm and intact distal pulses.   Respiratory: Effort normal and breath sounds normal. No respiratory distress. She has no wheezes.  GI: Soft. Bowel sounds are normal. She exhibits no distension. There is no tenderness.  Musculoskeletal: She exhibits no edema.  Has full range of motion left extremities. Unable to move right upper extremity. Limited range to right lower extremity   Neurological: She is alert.  Skin: Skin is warm and dry. She is not diaphoretic.       ASSESSMENT/  PLAN:  1. CVA with right hemiparesis: she is neurologically stable; will continue asa 325 mg daily will monitor   2. Hypertension: is stable will continue norvasc 10 mg daily and will monitor her status   3. Allergic rhinitis: is stable will continue claritin 10 mg daily   4. Anxiety: is emotionally stable will continue lexapro 10 mg daily and will continue remeron 15 mg nightly to help with insomnia  will monitor   5. CKD: stage IV:  Will check lab work and will monitor her status   6. Gerd: will continue protonix 40 mg daily   7. Dementia: there is no change in her status; will continue aricept 5 mg nightly will monitor

## 2014-06-19 ENCOUNTER — Non-Acute Institutional Stay (SKILLED_NURSING_FACILITY): Payer: Medicare Other | Admitting: Adult Health

## 2014-06-19 DIAGNOSIS — N184 Chronic kidney disease, stage 4 (severe): Secondary | ICD-10-CM

## 2014-06-19 DIAGNOSIS — I129 Hypertensive chronic kidney disease with stage 1 through stage 4 chronic kidney disease, or unspecified chronic kidney disease: Secondary | ICD-10-CM

## 2014-06-19 DIAGNOSIS — I69351 Hemiplegia and hemiparesis following cerebral infarction affecting right dominant side: Secondary | ICD-10-CM

## 2014-06-19 DIAGNOSIS — K219 Gastro-esophageal reflux disease without esophagitis: Secondary | ICD-10-CM

## 2014-06-19 DIAGNOSIS — I69959 Hemiplegia and hemiparesis following unspecified cerebrovascular disease affecting unspecified side: Secondary | ICD-10-CM

## 2014-06-19 DIAGNOSIS — J3089 Other allergic rhinitis: Secondary | ICD-10-CM

## 2014-06-19 DIAGNOSIS — I6990 Unspecified sequelae of unspecified cerebrovascular disease: Secondary | ICD-10-CM

## 2014-06-19 DIAGNOSIS — I150 Renovascular hypertension: Secondary | ICD-10-CM

## 2014-06-30 ENCOUNTER — Encounter: Payer: Self-pay | Admitting: Adult Health

## 2014-06-30 NOTE — Progress Notes (Signed)
Patient ID: Lindsay Shannon, female   DOB: 26-Oct-1923, 80 y.o.   MRN: [id number]     [facility]  No Known Allergies   Chief Complaint  Patient presents with  . Medical Management of Chronic Issues    HPI:  She is a long term resident of this facility being seen for the management of her chronic illnesses. Overall there is little change in her status. She is not voicing any complaints of concerns at this time. There are no concerns being voiced by the nursing staff.   Past Medical History  Diagnosis Date  . Hypertension   . Stroke 07/2013  . Chronic kidney disease     CKD stage 4  . Refusal of blood transfusions as patient is Je[employer]'s Witness   . NEOPLASM, BENIGN, STOMACH 01/30/2004    Qualifier: Diagnosis of  By: Laney Potash, Pam    . GASTRITIS 01/30/2004    Qualifier: Diagnosis of  By: Laney Potash, Pam    . TIA (transient ischemic attack) 07/24/2013  . Benign paroxysmal positional vertigo 01/24/2014  . CATARACTS, BILATERAL 02/08/2008    Qualifier: Diagnosis of  By: Laney Potash, Pam    . Paraproteinemia 09/06/2012    Past Surgical History  Procedure Laterality Date  . Partial hip arthroplasty    . Replacement total knee      VITAL SIGNS BP 133/76  Pulse 89  Ht 5\' 1"  (1.549 m)  Wt 122 lb 12.8 oz (55.702 kg)  BMI 23.21 kg/m2   Patient's Medications  New Prescriptions   No medications on file  Previous Medications   ACETAMINOPHEN (TYLENOL) 325 MG TABLET    Take 2 tablets (650 mg total) by mouth every 6 (six) hours as needed for mild pain.   AMLODIPINE (NORVASC) 10 MG TABLET    Take 1 tablet (10 mg total) by mouth daily.   ASPIRIN 325 MG TABLET    Take 1 tablet (325 mg total) by mouth daily.   ATENOLOL (TENORMIN) 50 MG TABLET    Take 25 mg by mouth daily.    DONEPEZIL (ARICEPT) 10 MG TABLET    Take 0.5 tablets (5 mg total) by mouth at bedtime. 1/2 tablet for 1 month then will increase to 1 tablet qhs   ESCITALOPRAM (LEXAPRO) 10 MG TABLET    Take 10 mg by mouth  daily.   IPRATROPIUM-ALBUTEROL (DUONEB) 0.5-2.5 (3) MG/3ML SOLN    Take 3 mLs by nebulization every 8 (eight) hours as needed.   LORATADINE (CLARITIN) 10 MG TABLET    Take 10 mg by mouth daily.   MIRTAZAPINE (REMERON) 15 MG TABLET    Take 15 mg by mouth at bedtime.   PANTOPRAZOLE (PROTONIX) 40 MG TABLET    Take 40 mg by mouth daily.   TRAMADOL-ACETAMINOPHEN (ULTRACET) 37.5-325 MG PER TABLET    Take 1 tablet by mouth 2 (two) times daily.  Modified Medications   No medications on file  Discontinued Medications   No medications on file    SIGNIFICANT DIAGNOSTIC EXAMS   LABS REVIEWED:   12-12-13: wbc 6.1; hgb 7.4; hct 22.9; mcv 90.2; plt 269; glucose 84; bun 38; creat 3.24; k+4.4; na++147 04-29-14: wbc 5.9; hgb 7.5; hct 24.9; mcv 93.3; plt 277; glucose 80; bun 64; creat 5.0; k+5.3; na++ 140; liver normal albulmin 2.8 05-02-14: wbc 7.0; hgb 7.9; hct 25.8; mcv 93.5;plt 263        Review of Systems  Constitutional: Negative for malaise/fatigue.  Respiratory: Negative for cough and shortness of  breath.   Cardiovascular: Negative for chest pain, palpitations and leg swelling.  Gastrointestinal: Negative for heartburn, abdominal pain and constipation.  Musculoskeletal: Negative for joint pain and myalgias.  Skin: Negative.   Neurological: Negative for headaches.  Psychiatric/Behavioral: Negative for depression. The patient is not nervous/anxious.       Physical Exam  Constitutional: No distress.  thin  Neck: Neck supple. No JVD present.  Cardiovascular: Normal rate, regular rhythm and intact distal pulses.   Respiratory: Effort normal and breath sounds normal. No respiratory distress. She has no wheezes.  GI: Soft. Bowel sounds are normal. She exhibits no distension. There is no tenderness.  Musculoskeletal: She exhibits no edema.  Has full range of motion left extremities. Unable to move right upper extremity. Limited range to right lower extremity   Neurological: She is alert.    Skin: Skin is warm and dry. She is not diaphoretic.       ASSESSMENT/ PLAN:  1. CVA with right hemiparesis: she is neurologically stable; will continue asa 325 mg daily will continue ultracet twice daily for pain management and will monitor   2. Hypertension: is stable will continue norvasc 10 mg daily and tenormin 25 mg daily and will monitor her status   3. Allergic rhinitis: is stable will continue claritin 10 mg daily   4. Anxiety: is emotionally stable will continue lexapro 10 mg daily and will continue remeron 15 mg nightly to help with insomnia  will monitor   5. CKD: stage IV:  Will check bmp and will monitor her status   6. Gerd: will continue protonix 40 mg daily   7. Dementia: there is no change in her status; will continue aricept 5 mg nightly will monitor

## 2014-07-29 ENCOUNTER — Non-Acute Institutional Stay (SKILLED_NURSING_FACILITY): Payer: Medicare Other | Admitting: Adult Health

## 2014-07-29 DIAGNOSIS — I69959 Hemiplegia and hemiparesis following unspecified cerebrovascular disease affecting unspecified side: Secondary | ICD-10-CM

## 2014-07-29 DIAGNOSIS — F0390 Unspecified dementia without behavioral disturbance: Secondary | ICD-10-CM

## 2014-07-29 DIAGNOSIS — I69351 Hemiplegia and hemiparesis following cerebral infarction affecting right dominant side: Secondary | ICD-10-CM

## 2014-07-29 DIAGNOSIS — I6389 Other cerebral infarction: Secondary | ICD-10-CM

## 2014-07-29 DIAGNOSIS — I6350 Cerebral infarction due to unspecified occlusion or stenosis of unspecified cerebral artery: Secondary | ICD-10-CM

## 2014-07-29 DIAGNOSIS — F411 Generalized anxiety disorder: Secondary | ICD-10-CM

## 2014-07-29 DIAGNOSIS — N184 Chronic kidney disease, stage 4 (severe): Secondary | ICD-10-CM

## 2014-07-29 DIAGNOSIS — I150 Renovascular hypertension: Secondary | ICD-10-CM

## 2014-07-29 DIAGNOSIS — J3089 Other allergic rhinitis: Secondary | ICD-10-CM

## 2014-07-29 DIAGNOSIS — I129 Hypertensive chronic kidney disease with stage 1 through stage 4 chronic kidney disease, or unspecified chronic kidney disease: Secondary | ICD-10-CM

## 2014-07-31 ENCOUNTER — Encounter: Payer: Self-pay | Admitting: Adult Health

## 2014-07-31 NOTE — Progress Notes (Signed)
Patient ID: ETNA FORQUER, female   DOB: 26-Oct-1923, 80 y.o.   MRN: [id number]      [facility]  No Known Allergies   Chief Complaint  Patient presents with  . Medical Management of Chronic Issues    HPI:  She is a long term resident of this facility being seen for the management of her chronic illnesses. Overall her status remains without change; her renal function and neurological status are without change. There are no concerns being voiced by the nursing staff at this time.    Past Medical History  Diagnosis Date  . Hypertension   . Stroke 07/2013  . Chronic kidney disease     CKD stage 4  . Refusal of blood transfusions as patient is Jehovah's Witness   . NEOPLASM, BENIGN, STOMACH 01/30/2004    Qualifier: Diagnosis of  By: Laney Potash, Pam    . GASTRITIS 01/30/2004    Qualifier: Diagnosis of  By: Laney Potash, Pam    . TIA (transient ischemic attack) 07/24/2013  . Benign paroxysmal positional vertigo 01/24/2014  . CATARACTS, BILATERAL 02/08/2008    Qualifier: Diagnosis of  By: Laney Potash, Pam    . Paraproteinemia 09/06/2012    Past Surgical History  Procedure Laterality Date  . Partial hip arthroplasty    . Replacement total knee      VITAL SIGNS BP 132/76  Pulse 74  Ht 5\' 1"  (1.549 m)  Wt 124 lb (56.246 kg)  BMI 23.44 kg/m2   Patient's Medications  New Prescriptions   No medications on file  Previous Medications   ACETAMINOPHEN (TYLENOL) 325 MG TABLET    Take 2 tablets (650 mg total) by mouth every 6 (six) hours as needed for mild pain.   AMLODIPINE (NORVASC) 10 MG TABLET    Take 1 tablet (10 mg total) by mouth daily.   ASPIRIN 325 MG TABLET    Take 1 tablet (325 mg total) by mouth daily.   ATENOLOL (TENORMIN) 50 MG TABLET    Take 25 mg by mouth daily.    DONEPEZIL (ARICEPT) 10 MG TABLET    Take 0.5 tablets (5 mg total) by mouth at bedtime. 1/2 tablet for 1 month then will increase to 1 tablet qhs   ESCITALOPRAM (LEXAPRO) 10 MG TABLET    Take 10 mg  by mouth daily.   IPRATROPIUM-ALBUTEROL (DUONEB) 0.5-2.5 (3) MG/3ML SOLN    Take 3 mLs by nebulization every 8 (eight) hours as needed.   LORATADINE (CLARITIN) 10 MG TABLET    Take 10 mg by mouth daily.   MIRTAZAPINE (REMERON) 15 MG TABLET    Take 15 mg by mouth at bedtime.   PANTOPRAZOLE (PROTONIX) 40 MG TABLET    Take 40 mg by mouth daily.   TRAMADOL-ACETAMINOPHEN (ULTRACET) 37.5-325 MG PER TABLET    Take 1 tablet by mouth 2 (two) times daily.  Modified Medications   No medications on file  Discontinued Medications   No medications on file    SIGNIFICANT DIAGNOSTIC EXAMS   LABS REVIEWED:   12-12-13: wbc 6.1; hgb 7.4; hct 22.9; mcv 90.2; plt 269; glucose 84; bun 38; creat 3.24; k+4.4; na++147 04-29-14: wbc 5.9; hgb 7.5; hct 24.9; mcv 93.3; plt 277; glucose 80; bun 64; creat 5.0; k+5.3; na++ 140; liver normal albulmin 2.8 05-02-14: wbc 7.0; hgb 7.9; hct 25.8; mcv 93.5;plt 263   05-06-14: glucose 79; bun 62; creat 5.0; k+5.4; na++139 06-20-14: glucose 76; bun 45; creat 4.5;k+5.2; na++141  Review of Systems  Constitutional: Negative for malaise/fatigue.  Respiratory: Negative for cough and shortness of breath.   Cardiovascular: Negative for chest pain, palpitations and leg swelling.  Gastrointestinal: Negative for heartburn, abdominal pain and constipation.  Musculoskeletal: Negative for joint pain and myalgias.  Skin: Negative.   Neurological: Negative for headaches.  Psychiatric/Behavioral: Negative for depression. The patient is not nervous/anxious.       Physical Exam  Constitutional: No distress.  thin  Neck: Neck supple. No JVD present.  Cardiovascular: Normal rate, regular rhythm and intact distal pulses.   Respiratory: Effort normal and breath sounds normal. No respiratory distress. She has no wheezes.  GI: Soft. Bowel sounds are normal. She exhibits no distension. There is no tenderness.  Musculoskeletal: She exhibits no edema.  Has full range of motion left  extremities. Unable to move right upper extremity. Limited range to right lower extremity   Neurological: She is alert.  Skin: Skin is warm and dry. She is not diaphoretic.       ASSESSMENT/ PLAN:  1. CVA with right hemiparesis: she is neurologically stable; will continue asa 325 mg daily will continue ultracet twice daily for pain management for her hemiplegia and will monitor   2. Hypertension: is stable will continue norvasc 10 mg daily and tenormin 50 mg daily and will monitor her status   3. Allergic rhinitis: is stable will continue claritin 10 mg daily   4. Anxiety: is emotionally stable will continue lexapro 10 mg daily and will continue remeron 15 mg nightly to help with insomnia  will monitor   5. CKD: stage IV:  Will not make changes will monitor her status    6. Gerd: will continue protonix 40 mg daily   7. Dementia: there is no change in her status; will continue aricept 5 mg nightly will monitor      Ok Edwards NP [facility]  Contact [phone] Monday through Friday 8am- 5pm  After hours call [phone]

## 2014-09-04 ENCOUNTER — Encounter: Payer: Self-pay | Admitting: Adult Health

## 2014-09-04 ENCOUNTER — Non-Acute Institutional Stay (SKILLED_NURSING_FACILITY): Payer: Medicare Other | Admitting: Adult Health

## 2014-09-04 DIAGNOSIS — I69351 Hemiplegia and hemiparesis following cerebral infarction affecting right dominant side: Secondary | ICD-10-CM

## 2014-09-04 DIAGNOSIS — I6389 Other cerebral infarction: Secondary | ICD-10-CM

## 2014-09-04 DIAGNOSIS — K219 Gastro-esophageal reflux disease without esophagitis: Secondary | ICD-10-CM

## 2014-09-04 DIAGNOSIS — I69851 Hemiplegia and hemiparesis following other cerebrovascular disease affecting right dominant side: Secondary | ICD-10-CM

## 2014-09-04 DIAGNOSIS — N184 Chronic kidney disease, stage 4 (severe): Secondary | ICD-10-CM

## 2014-09-04 DIAGNOSIS — F0390 Unspecified dementia without behavioral disturbance: Secondary | ICD-10-CM

## 2014-09-04 DIAGNOSIS — I150 Renovascular hypertension: Secondary | ICD-10-CM

## 2014-09-04 DIAGNOSIS — J3089 Other allergic rhinitis: Secondary | ICD-10-CM

## 2014-09-04 DIAGNOSIS — I129 Hypertensive chronic kidney disease with stage 1 through stage 4 chronic kidney disease, or unspecified chronic kidney disease: Secondary | ICD-10-CM

## 2014-09-04 DIAGNOSIS — I638 Other cerebral infarction: Secondary | ICD-10-CM

## 2014-09-04 NOTE — Progress Notes (Signed)
[facility]   Code Status: Full  No Known Allergies  Chief Complaint: Medical Management of Chronic Health Issues  HPI:  Lindsay Shannon is an 80 yr old female being seen today for a routine visit. Overall her status remains the same. She reports no new concerns. Staff is not voicing any concerns today.   Review of Systems:  Constitutional: Negative for fever, chills, weight loss, malaise/fatigue and diaphoresis.  Respiratory: Negative for cough, sputum production, shortness of breath and wheezing.   Cardiovascular: Negative for chest pain, palpitations, orthopnea and leg swelling.  Gastrointestinal: Negative for heartburn, nausea, vomiting, abdominal pain, diarrhea and constipation.  Genitourinary: Negative for dysuria, urgency, frequency, hematuria and flank pain.  Musculoskeletal: Positive right sided weakness. Negative for back pain and falls. Skin: Negative for itching and rash.  Neurological: Negative for weakness,dizziness,  and headaches.  Psychiatric/Behavioral: Negative for depression. The patient is not nervous/anxious.     Past Medical History  Diagnosis Date  . Hypertension   . Stroke 07/2013  . Chronic kidney disease     CKD stage 4  . Refusal of blood transfusions as patient is Jehovah's Witness   . NEOPLASM, BENIGN, STOMACH 01/30/2004    Qualifier: Diagnosis of  By: Laney Potash, Pam    . GASTRITIS 01/30/2004    Qualifier: Diagnosis of  By: Laney Potash, Pam    . TIA (transient ischemic attack) 07/24/2013  . Benign paroxysmal positional vertigo 01/24/2014  . CATARACTS, BILATERAL 02/08/2008    Qualifier: Diagnosis of  By: Laney Potash, Pam    . Paraproteinemia 09/06/2012   Past Surgical History  Procedure Laterality Date  . Partial hip arthroplasty    . Replacement total knee     Social History:   reports that she has never smoked. She has never used smokeless tobacco. She reports that she does not drink alcohol or use illicit drugs.  History reviewed. No  pertinent family history.  Medications   Medication List       This list is accurate as of: 09/04/14 11:06 AM.  Always use your most recent med list.               acetaminophen 325 MG tablet  Commonly known as:  TYLENOL  Take 2 tablets (650 mg total) by mouth every 6 (six) hours as needed for mild pain.     amLODipine 10 MG tablet  Commonly known as:  NORVASC  Take 1 tablet (10 mg total) by mouth daily.     aspirin 325 MG tablet  Take 1 tablet (325 mg total) by mouth daily.     atenolol 50 MG tablet  Commonly known as:  TENORMIN  Take 25 mg by mouth daily.     donepezil 10 MG tablet  Commonly known as:  ARICEPT  Take 0.5 tablets (5 mg total) by mouth at bedtime. 1/2 tablet for 1 month then will increase to 1 tablet qhs     escitalopram 10 MG tablet  Commonly known as:  LEXAPRO  Take 10 mg by mouth daily.     ipratropium-albuterol 0.5-2.5 (3) MG/3ML Soln  Commonly known as:  DUONEB  Take 3 mLs by nebulization every 8 (eight) hours as needed.     loratadine 10 MG tablet  Commonly known as:  CLARITIN  Take 10 mg by mouth daily.     mirtazapine 15 MG tablet  Commonly known as:  REMERON  Take 15 mg by mouth at bedtime.     pantoprazole 40  MG tablet  Commonly known as:  PROTONIX  Take 40 mg by mouth daily.     traMADol-acetaminophen 37.5-325 MG per tablet  Commonly known as:  ULTRACET  Take 1 tablet by mouth 2 (two) times daily.          Physical Exam:  Filed Vitals:   09/04/14 1055  BP: 117/62  Pulse: 56  Resp: 18  Weight: 121 lb 3.2 oz (54.976 kg)    General- elderly female in no acute distress Neck- no lymphadenopathy, no thyromegaly, no jugular vein distension, no carotid bruit Cardiovascular- normal s1,s2, no murmurs/ rubs/ gallops; no leg edema Respiratory- bilateral clear to auscultation, no wheeze, no rhonchi, no crackles, no use of accessory muscles Abdomen- bowel sounds present, soft, non tender, non-distended Musculoskeletal- Full ROM  to left extremities; no movement to Right upper extremity, limited ROM to right lower extremity.  Neurological- alert Skin- warm and dry Psychiatry- alert and oriented to person, place and time, normal mood and affect  SIGNIFICANT DIAGNOSTIC EXAMS   LABS REVIEWED:   12-12-13: wbc 6.1; hgb 7.4; hct 22.9; mcv 90.2; plt 269; glucose 84; bun 38; creat 3.24; k+4.4; na++147 04-29-14: wbc 5.9; hgb 7.5; hct 24.9; mcv 93.3; plt 277; glucose 80; bun 64; creat 5.0; k+5.3; na++ 140; liver normal albulmin 2.8 05-02-14: wbc 7.0; hgb 7.9; hct 25.8; mcv 93.5;plt 263   05-06-14: glucose 79; bun 62; creat 5.0; k+5.4; na++139 06-20-14: glucose 76; bun 45; creat 4.5;k+5.2; na++141    Assessment/Plan  1. CVA with right hemiparesis: she is neurologically stable; will continue asa 325 mg daily; will continue ultracet twice daily for pain management for her hemiplegia and will monitor   2. Hypertension: stable; will continue norvasc 10 mg daily and tenormin 50 mg daily and will monitor   3. Allergic rhinitis: no issues; will continue claritin 10 mg daily   4. Anxiety: no issues;  will continue lexapro 10 mg daily and will continue remeron 15 mg nightly to help with insomnia; continue to monitor   5. CKD: stage IV:  No issues presently; continue to monitor    6. Gerd: will continue protonix 40 mg daily   7. Dementia: no changes in status; will continue aricept 5 mg nightly will monitor   Keatts, Demetrius Charity, [place] NP [facility]  Contact [phone] Monday through Friday 8am- 5pm  After hours call [phone]

## 2014-10-04 ENCOUNTER — Non-Acute Institutional Stay (SKILLED_NURSING_FACILITY): Payer: Medicare Other | Admitting: Registered Nurse

## 2014-10-04 ENCOUNTER — Encounter: Payer: Self-pay | Admitting: Registered Nurse

## 2014-10-04 DIAGNOSIS — I150 Renovascular hypertension: Secondary | ICD-10-CM

## 2014-10-04 DIAGNOSIS — F0393 Unspecified dementia, unspecified severity, with mood disturbance: Secondary | ICD-10-CM

## 2014-10-04 DIAGNOSIS — F329 Major depressive disorder, single episode, unspecified: Secondary | ICD-10-CM

## 2014-10-04 DIAGNOSIS — M159 Polyosteoarthritis, unspecified: Secondary | ICD-10-CM

## 2014-10-04 DIAGNOSIS — L853 Xerosis cutis: Secondary | ICD-10-CM

## 2014-10-04 DIAGNOSIS — F0390 Unspecified dementia without behavioral disturbance: Secondary | ICD-10-CM

## 2014-10-04 DIAGNOSIS — I69851 Hemiplegia and hemiparesis following other cerebrovascular disease affecting right dominant side: Secondary | ICD-10-CM

## 2014-10-04 DIAGNOSIS — K219 Gastro-esophageal reflux disease without esophagitis: Secondary | ICD-10-CM

## 2014-10-04 DIAGNOSIS — F0280 Dementia in other diseases classified elsewhere without behavioral disturbance: Secondary | ICD-10-CM

## 2014-10-04 DIAGNOSIS — I69351 Hemiplegia and hemiparesis following cerebral infarction affecting right dominant side: Secondary | ICD-10-CM

## 2014-10-04 DIAGNOSIS — J3089 Other allergic rhinitis: Secondary | ICD-10-CM

## 2014-10-04 NOTE — Progress Notes (Signed)
Patient ID: Lindsay Shannon, female   DOB: January 12, 1923, 80 y.o.   MRN: [id number]    Place of Service: [facility]  No Known Allergies  Code Status: Full Code  Goals of Care: Longevity/Long term care  Chief Complaint  Patient presents with  . Medical Management of Chronic Issues    old CVA, HTN, AR, GERD, dementia    HPI 80 y.o. 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 Weight stable. No change in functional status for behaviors reported. No recent falls. No concerns from staff. Family is at bedside. Patient reported having right leg pain, unable to describe pain in details. No additional complaints verbalized.   Review of Systems Constitutional: Negative for fever and chills HENT: Negative for ear pain, congestion, and sore throat Eyes: Negative for eye pain and visual disturbance  Cardiovascular: Negative for chest pain and leg swelling Respiratory: Negative cough, shortness of breath, and wheezing.  Gastrointestinal: Negative for nausea and vomiting. Genitourinary: Negative for  dysuria Musculoskeletal: Negative for back pain. Positive for Right leg pain Neurological: Negative for dizziness, headache, weakness, and tremors.  Skin: Negative for rash and wound.   Psychiatric: has dementia at baseline. Negative for depression   Past Medical History  Diagnosis Date  . Hypertension   . Stroke 07/2013  . Chronic kidney disease     CKD stage 4  . Refusal of blood transfusions as patient is Jehovah's Witness   . NEOPLASM, BENIGN, STOMACH 01/30/2004    Qualifier: Diagnosis of  By: Laney Potash, Pam    . GASTRITIS 01/30/2004    Qualifier: Diagnosis of  By: Laney Potash, Pam    . TIA (transient ischemic attack) 07/24/2013  . Benign paroxysmal positional vertigo 01/24/2014  . CATARACTS, BILATERAL 02/08/2008    Qualifier: Diagnosis of  By: Laney Potash, Pam    . Paraproteinemia  09/06/2012    Past Surgical History  Procedure Laterality Date  . Partial hip arthroplasty    . Replacement total knee      History   Social History  . Marital Status: Widowed    Spouse Name: N/A    Number of Children: N/A  . Years of Education: N/A   Occupational History  . Not on file.   Social History Main Topics  . Smoking status: Never Smoker   . Smokeless tobacco: Never Used  . Alcohol Use: No  . Drug Use: No  . Sexual Activity: Not on file   Other Topics Concern  . Not on file   Social History Narrative      Medication List       This list is accurate as of: 10/04/14  1:41 PM.  Always use your most recent med list.               acetaminophen 325 MG tablet  Commonly known as:  TYLENOL  Take 2 tablets (650 mg total) by mouth every 6 (six) hours as needed for mild pain.     amLODipine 10 MG tablet  Commonly known as:  NORVASC  Take 1 tablet (10 mg total) by mouth daily.     aspirin 325 MG tablet  Take 1 tablet (325 mg total) by mouth daily.     atenolol 50 MG tablet  Commonly known as:  TENORMIN  Take 25 mg by mouth daily.     donepezil 10 MG tablet  Commonly known as:  ARICEPT  Take 0.5 tablets (5 mg total) by mouth at bedtime. 1/2 tablet for 1 month then will increase to 1 tablet qhs     escitalopram 10 MG tablet  Commonly known as:  LEXAPRO  Take 10 mg by mouth daily.     ipratropium-albuterol 0.5-2.5 (3) MG/3ML Soln  Commonly known as:  DUONEB  Take 3 mLs by nebulization every 8 (eight) hours as needed.     loratadine 10 MG tablet  Commonly known as:  CLARITIN  Take 10 mg by mouth daily.     mirtazapine 15 MG tablet  Commonly known as:  REMERON  Take 15 mg by mouth at bedtime.     pantoprazole 40 MG tablet  Commonly known as:  PROTONIX  Take 40 mg by mouth daily.     traMADol-acetaminophen 37.5-325 MG per tablet  Commonly known as:  ULTRACET  Take 1 tablet by mouth 2 (two) times daily.        Physical Exam Filed  Vitals:   10/04/14 1338  BP: 132/64  Pulse: 68  Temp: 96.6 F (35.9 C)  Resp: 18   Constitutional: WDWN elderly female in no acute distress. Conversant and pleasant. Has some speech difficulty.  HEENT: Normocephalic and atraumatic. PERRL. EOM intact. No icterus. No nasal discharge or sinus tenderness. Oral mucosa moist. Posterior pharynx clear of any exudate or lesions.  Neck: Supple and nontender. No lymphadenopathy, masses, or thyromegaly. No JVD or carotid bruits. Cardiac: Normal S1, S2. RRR without appreciable murmurs, rubs, or gallops. Distal pulses intact. No dependent edema.  Lungs: No respiratory distress. Breath sounds clear bilaterally without rales, rhonchi, or wheezes. Abdomen: Audible bowel sounds in all quadrants. Soft, nontender, nondistended.  Musculoskeletal: Able to move all extremities. Right hemiparesis present. Left leg is tender to palpation. Very ticklish in her feet.  Spine and Back: Normal spinal profile. No scoliosis or kyphosis. No tenderness over spines. No CVA tenderness.  Skin: Warm and dry. No rash noted. Skin around both ankles/feet very dry and flaky.  Neurological: Alert  Psychiatric: Appropriate mood and affect.   Labs Reviewed 12-12-13: wbc 6.1; hgb 7.4; hct 22.9; mcv 90.2; plt 269; glucose 84; bun 38; creat 3.24; k+4.4; na++147 04-29-14: wbc 5.9; hgb 7.5; hct 24.9; mcv 93.3; plt 277; glucose 80; bun 64; creat 5.0; k+5.3; na++ 140; liver normal albulmin 2.8 05-02-14: wbc 7.0; hgb 7.9; hct 25.8; mcv 93.5;plt 263  05-06-14: glucose 79; bun 62; creat 5.0; k+5.4; na++139 06-20-14: glucose 76; bun 45; creat 4.5;k+5.2; na++141   Assessment & Plan 1. Benign renovascular hypertension Stable. Continue norvasc 10mg  daily and monitor.   2. Other allergic rhinitis Stable. Continue claritin 10mg  daily and monitior.   3. Hemiparesis affecting right side as late effect of cerebrovascular accident No issues. Continue asa 325mg  daily. Continue to monitor.   4.  Gastroesophageal reflux disease without esophagitis Stable. Continue protonix 40mg  daily and monitor. If remains asymptomatic, will d/c next routine visit.   5. Osteoarthritis of multiple joints, unspecified osteoarthritis type Reported Right leg pain today. Will restart tylenol 650mg  Q6H prn pain. Continue ultracet 37.5/325mg  twice daily and monitor  6. Dementia, without behavioral disturbance Stable. Continue aricept 10mg  daily and monitor for change in behaviors. Continue fall risk and pressure ulcer precautions.   7. Depression Stable. Continue lexapro 10mg  dail, remeron 15mg  daiy at bedtime and monitor for change in mood.  8. Xerosis Eucerin ointment topically BID to BLE. Continue to monitor.     Family/Staff Communication Plan of care  discuss with family and nursing staff. Family and nursing staff verbalize understanding and agree with plan of care. No additional questions or concerns reported.    Arthur Holms, MSN, AGNP-C [facility] [address] [place], [place] [address] [phone] [8am-5pm] After hours: [phone]

## 2014-10-24 ENCOUNTER — Encounter: Payer: Self-pay | Admitting: Internal Medicine

## 2014-10-24 ENCOUNTER — Non-Acute Institutional Stay (SKILLED_NURSING_FACILITY): Payer: Medicare Other | Admitting: Internal Medicine

## 2014-10-24 DIAGNOSIS — F0393 Unspecified dementia, unspecified severity, with mood disturbance: Secondary | ICD-10-CM

## 2014-10-24 DIAGNOSIS — I10 Essential (primary) hypertension: Secondary | ICD-10-CM

## 2014-10-24 DIAGNOSIS — K219 Gastro-esophageal reflux disease without esophagitis: Secondary | ICD-10-CM

## 2014-10-24 DIAGNOSIS — N184 Chronic kidney disease, stage 4 (severe): Secondary | ICD-10-CM

## 2014-10-24 DIAGNOSIS — I69851 Hemiplegia and hemiparesis following other cerebrovascular disease affecting right dominant side: Secondary | ICD-10-CM

## 2014-10-24 DIAGNOSIS — N189 Chronic kidney disease, unspecified: Secondary | ICD-10-CM

## 2014-10-24 DIAGNOSIS — F329 Major depressive disorder, single episode, unspecified: Secondary | ICD-10-CM

## 2014-10-24 DIAGNOSIS — I69351 Hemiplegia and hemiparesis following cerebral infarction affecting right dominant side: Secondary | ICD-10-CM

## 2014-10-24 DIAGNOSIS — D631 Anemia in chronic kidney disease: Secondary | ICD-10-CM

## 2014-10-24 DIAGNOSIS — F0280 Dementia in other diseases classified elsewhere without behavioral disturbance: Secondary | ICD-10-CM

## 2014-10-24 DIAGNOSIS — F0390 Unspecified dementia without behavioral disturbance: Secondary | ICD-10-CM

## 2014-10-24 NOTE — Progress Notes (Signed)
Patient ID: Lindsay Shannon, female   DOB: 1923/01/19, 80 y.o.   MRN: [id number]    Facility: [facility]   Chief Complaint  Patient presents with  . Medical Management of Chronic Issues   No Known Allergies  Code status:full code  HPI 80 y/o female patient is seen in her room today for routine visit. She has history of CVA with right sided hemiparesis, dementia, GERD among others. She is in no distress and denies any complaints. She is alert and oriented. No skin concerns from staff. No acute behavioral changes. No falls reported.  Review of Systems   Constitutional: Negative for fever, chills, malaise/fatigue and diaphoresis.   HENT: Negative for hearing loss and sore throat.    Eyes: Negative for blurred vision, double vision and discharge.   Respiratory: Negative for cough, sputum production Cardiovascular: Negative for chest pain, palpitations, orthopnea and leg swelling.   Gastrointestinal: Negative for heartburn, nausea, vomiting, abdominal pain, diarrhea and constipation.   Genitourinary: Negative for dysuria and flank pain.   Musculoskeletal: Negative for back pain, falls Skin: Negative for itching and rash.   Neurological: Negative for dizziness, tingling, focal weakness and headaches.   Psychiatric/Behavioral: Negative for depression   Past Medical History  Diagnosis Date  . Hypertension   . Stroke 07/2013  . Chronic kidney disease     CKD stage 4  . Refusal of blood transfusions as patient is Jehovah's Witness   . NEOPLASM, BENIGN, STOMACH 01/30/2004    Qualifier: Diagnosis of  By: Laney Potash, Pam    . GASTRITIS 01/30/2004    Qualifier: Diagnosis of  By: Laney Potash, Pam    . TIA (transient ischemic attack) 07/24/2013  . Benign paroxysmal positional vertigo 01/24/2014  . CATARACTS, BILATERAL 02/08/2008    Qualifier: Diagnosis of  By: Laney Potash, Pam    . Paraproteinemia 09/06/2012     Medication List       This list is accurate  as of: 10/24/14  5:22 PM.  Always use your most recent med list.               acetaminophen 325 MG tablet  Commonly known as:  TYLENOL  Take 2 tablets (650 mg total) by mouth every 6 (six) hours as needed for mild pain.     amLODipine 10 MG tablet  Commonly known as:  NORVASC  Take 1 tablet (10 mg total) by mouth daily.     aspirin 325 MG tablet  Take 1 tablet (325 mg total) by mouth daily.     atenolol 50 MG tablet  Commonly known as:  TENORMIN  Take 25 mg by mouth daily.     donepezil 10 MG tablet  Commonly known as:  ARICEPT  Take 0.5 tablets (5 mg total) by mouth at bedtime. 1/2 tablet for 1 month then will increase to 1 tablet qhs     escitalopram 10 MG tablet  Commonly known as:  LEXAPRO  Take 10 mg by mouth daily.     ipratropium-albuterol 0.5-2.5 (3) MG/3ML Soln  Commonly known as:  DUONEB  Take 3 mLs by nebulization every 8 (eight) hours as needed.     loratadine 10 MG tablet  Commonly known as:  CLARITIN  Take 10 mg by mouth daily.     mirtazapine 15 MG tablet  Commonly known as:  REMERON  Take 15 mg by mouth at bedtime.     pantoprazole 40 MG tablet  Commonly known as:  PROTONIX  Take 40 mg by mouth daily.     traMADol-acetaminophen 37.5-325 MG per tablet  Commonly known as:  ULTRACET  Take 1 tablet by mouth 2 (two) times daily.       Physical exam BP 118/68 mmHg  Pulse 72  Temp(Src) 98 F (36.7 C)  Resp 18  General- elderly female in no acute distress Head- atraumatic, normocephalic Eyes- PERRLA, EOMI, no pallor, no icterus, no discharge Neck- no lymphadenopathy Throat- moist mucus membrane Cardiovascular- normal s1,s2, no murmurs Respiratory- bilateral clear to auscultation, no wheeze, no rhonchi, no crackles, no use of accessory muscles Abdomen- bowel sounds present, soft, non tender Musculoskeletal- able to move her left extremities, right sided hemiparesis (old), no leg edema Neurological- no focal deficit Skin- warm and  dry Psychiatry- alert, normal mood and affect  Labs 12-12-13: wbc 6.1; hgb 7.4; hct 22.9; mcv 90.2; plt 269; glucose 84; bun 38; creat 3.24; k+4.4; na++147 04-29-14: wbc 5.9; hgb 7.5; hct 24.9; mcv 93.3; plt 277; glucose 80; bun 64; creat 5.0; k+5.3; na++ 140; liver normal albulmin 2.8 05-02-14: wbc 7.0; hgb 7.9; hct 25.8; mcv 93.5;plt 263    05-06-14: glucose 79; bun 62; creat 5.0; k 5.4; na++139 06-20-14: glucose 76; bun 45; creat 4.5;k+5.2; na++141    Assessment/plan  HTN bp stable, continue norvasc 10 mg daily and atenolol 25 mg daily, monitor bp  cva with right hemiparesis Stable bp, continue aspirin, lowh&h, monitor. Check lipid panel  gerd Symptom controlled, decrease protonix to 20 mg daily  Dementia No behavioral change reported. Continue aricept and bp med. Skin care, fall precautions and assistnce with ADLs  Depression Mood remains stable. Attempt GDR and decrease remeron to 7.5 mg daily for now. Continue lexapro. Monitor  ckd  Recheck bmp next lab  Anemia of chronic disease Reviewed h&h from 6/15. Recheck cbc, ferritin and erythropoetin level

## 2014-10-25 LAB — CBC AND DIFFERENTIAL
HEMATOCRIT: 30 % — AB (ref 36–46)
Hemoglobin: 9.1 g/dL — AB (ref 12.0–16.0)
PLATELETS: 235 10*3/uL (ref 150–399)
WBC: 6.9 10*3/mL

## 2014-10-25 LAB — LIPID PANEL
Cholesterol: 168 mg/dL (ref 0–200)
Cholesterol: 168 mg/dL (ref 0–200)
HDL: 42 mg/dL (ref 35–70)
HDL: 42 mg/dL (ref 35–70)
LDL CALC: 61 mg/dL
LDL CALC: 106 mg/dL
TRIGLYCERIDES: 126 mg/dL (ref 40–160)
Triglycerides: 100 mg/dL (ref 40–160)

## 2014-10-25 LAB — BASIC METABOLIC PANEL
BUN: 51 mg/dL — AB (ref 4–21)
Creatinine: 4.9 mg/dL — AB (ref 0.5–1.1)
POTASSIUM: 4.7 mmol/L (ref 3.4–5.3)
Sodium: 140 mmol/L (ref 137–147)

## 2014-10-30 ENCOUNTER — Other Ambulatory Visit: Payer: Self-pay | Admitting: *Deleted

## 2014-10-30 DIAGNOSIS — M1612 Unilateral primary osteoarthritis, left hip: Secondary | ICD-10-CM

## 2014-10-30 MED ORDER — TRAMADOL-ACETAMINOPHEN 37.5-325 MG PO TABS: ORAL_TABLET | ORAL | Status: DC

## 2014-10-30 NOTE — Telephone Encounter (Signed)
[facility] 

## 2014-11-25 ENCOUNTER — Non-Acute Institutional Stay (SKILLED_NURSING_FACILITY): Payer: Medicare Other | Admitting: Registered Nurse

## 2014-11-25 DIAGNOSIS — I150 Renovascular hypertension: Secondary | ICD-10-CM

## 2014-11-25 DIAGNOSIS — K219 Gastro-esophageal reflux disease without esophagitis: Secondary | ICD-10-CM

## 2014-11-25 DIAGNOSIS — F0390 Unspecified dementia without behavioral disturbance: Secondary | ICD-10-CM

## 2014-11-25 DIAGNOSIS — J3089 Other allergic rhinitis: Secondary | ICD-10-CM

## 2014-11-25 DIAGNOSIS — I69351 Hemiplegia and hemiparesis following cerebral infarction affecting right dominant side: Secondary | ICD-10-CM

## 2014-11-25 DIAGNOSIS — N189 Chronic kidney disease, unspecified: Secondary | ICD-10-CM

## 2014-11-25 DIAGNOSIS — I69851 Hemiplegia and hemiparesis following other cerebrovascular disease affecting right dominant side: Secondary | ICD-10-CM

## 2014-11-25 DIAGNOSIS — F0280 Dementia in other diseases classified elsewhere without behavioral disturbance: Secondary | ICD-10-CM

## 2014-11-25 DIAGNOSIS — N185 Chronic kidney disease, stage 5: Secondary | ICD-10-CM

## 2014-11-25 DIAGNOSIS — F329 Major depressive disorder, single episode, unspecified: Secondary | ICD-10-CM

## 2014-11-25 DIAGNOSIS — F0393 Unspecified dementia, unspecified severity, with mood disturbance: Secondary | ICD-10-CM

## 2014-11-25 DIAGNOSIS — M159 Polyosteoarthritis, unspecified: Secondary | ICD-10-CM

## 2014-11-25 DIAGNOSIS — D631 Anemia in chronic kidney disease: Secondary | ICD-10-CM

## 2014-11-26 ENCOUNTER — Encounter: Payer: Self-pay | Admitting: Registered Nurse

## 2014-11-26 NOTE — Progress Notes (Addendum)
Patient ID: Lindsay Shannon, female   DOB: 1923-08-29, 81 y.o.   MRN: [id number]    Place of Service: [facility]  No Known Allergies  Code Status: Full Code  Goals of Care: Longevity/Long term care  Chief Complaint  Patient presents with  . Medical Management of Chronic Issues    HTN, GERD, old CVA, depression, CKD, dementia, AR    HPI 81 y.o. female with PMH of old cva s/p right hemiparesis, CKD, vascular dementia, HTN, GERD, OA among others is being seen for a routine visit for management of her chronic issues. Weight stable over the past month. No recent fall or skin concerns reported. No change in functional status or behaviors reported. No concerns from staff. Renal function worsen with most recent eGFR 10.85, GERD stable since dose reduction of ppi. Depression stable on lowered dose of remeron and lexapro. HTN stable with norvasc and atenolol. BP range 110s/60s-130s/70s. AR stable on claritin. Seen in wheel chair today. No complaints verbalized by patient   Review of Systems Constitutional: Negative for fever and chills HENT: Negative for ear pain, congestion, and sore throat Eyes: Negative for eye pain and visual disturbance  Cardiovascular: Negative for chest pain and leg swelling Respiratory: Negative cough, shortness of breath, and wheezing.  Gastrointestinal: Negative for nausea and vomiting. Genitourinary: Negative for  dysuria Musculoskeletal: Negative for back pain, joint pain, and joint tenderness Neurological: Negative for dizziness, headache, weakness, and tremors.  Skin: Negative for rash and wound.   Psychiatric: Negative for depression  Past Medical History  Diagnosis Date  . Hypertension   . Stroke 07/2013  . Chronic kidney disease     CKD stage 4  . Refusal of blood transfusions as patient is Jehovah's Witness   . NEOPLASM, BENIGN, STOMACH 01/30/2004    Qualifier: Diagnosis of  By: Laney Potash, Pam    . GASTRITIS 01/30/2004    Qualifier:  Diagnosis of  By: Laney Potash, Pam    . TIA (transient ischemic attack) 07/24/2013  . Benign paroxysmal positional vertigo 01/24/2014  . CATARACTS, BILATERAL 02/08/2008    Qualifier: Diagnosis of  By: Laney Potash, Pam    . Paraproteinemia 09/06/2012    Past Surgical History  Procedure Laterality Date  . Partial hip arthroplasty    . Replacement total knee      History   Social History  . Marital Status: Widowed    Spouse Name: N/A    Number of Children: N/A  . Years of Education: N/A   Occupational History  . Not on file.   Social History Main Topics  . Smoking status: Never Smoker   . Smokeless tobacco: Never Used  . Alcohol Use: No  . Drug Use: No  . Sexual Activity: Not on file   Other Topics Concern  . Not on file   Social History Narrative      Medication List       This list is accurate as of: 11/25/14 11:59 PM.  Always use your most recent med list.               acetaminophen 325 MG tablet  Commonly known as:  TYLENOL  Take 2 tablets (650 mg total) by mouth every 6 (six) hours as needed for mild pain.     amLODipine 10 MG tablet  Commonly known as:  NORVASC  Take 1 tablet (10 mg total) by mouth daily.     aspirin 325 MG tablet  Take 1 tablet (325  Patient ID: Lindsay Shannon, female   DOB: 1923-08-29, 81 y.o.   MRN: [id number]    Place of Service: [facility]  No Known Allergies  Code Status: Full Code  Goals of Care: Longevity/Long term care  Chief Complaint  Patient presents with  . Medical Management of Chronic Issues    HTN, GERD, old CVA, depression, CKD, dementia, AR    HPI 81 y.o. female with PMH of old cva s/p right hemiparesis, CKD, vascular dementia, HTN, GERD, OA among others is being seen for a routine visit for management of her chronic issues. Weight stable over the past month. No recent fall or skin concerns reported. No change in functional status or behaviors reported. No concerns from staff. Renal function worsen with most recent eGFR 10.85, GERD stable since dose reduction of ppi. Depression stable on lowered dose of remeron and lexapro. HTN stable with norvasc and atenolol. BP range 110s/60s-130s/70s. AR stable on claritin. Seen in wheel chair today. No complaints verbalized by patient   Review of Systems Constitutional: Negative for fever and chills HENT: Negative for ear pain, congestion, and sore throat Eyes: Negative for eye pain and visual disturbance  Cardiovascular: Negative for chest pain and leg swelling Respiratory: Negative cough, shortness of breath, and wheezing.  Gastrointestinal: Negative for nausea and vomiting. Genitourinary: Negative for  dysuria Musculoskeletal: Negative for back pain, joint pain, and joint tenderness Neurological: Negative for dizziness, headache, weakness, and tremors.  Skin: Negative for rash and wound.   Psychiatric: Negative for depression  Past Medical History  Diagnosis Date  . Hypertension   . Stroke 07/2013  . Chronic kidney disease     CKD stage 4  . Refusal of blood transfusions as patient is Jehovah's Witness   . NEOPLASM, BENIGN, STOMACH 01/30/2004    Qualifier: Diagnosis of  By: Laney Potash, Pam    . GASTRITIS 01/30/2004    Qualifier:  Diagnosis of  By: Laney Potash, Pam    . TIA (transient ischemic attack) 07/24/2013  . Benign paroxysmal positional vertigo 01/24/2014  . CATARACTS, BILATERAL 02/08/2008    Qualifier: Diagnosis of  By: Laney Potash, Pam    . Paraproteinemia 09/06/2012    Past Surgical History  Procedure Laterality Date  . Partial hip arthroplasty    . Replacement total knee      History   Social History  . Marital Status: Widowed    Spouse Name: N/A    Number of Children: N/A  . Years of Education: N/A   Occupational History  . Not on file.   Social History Main Topics  . Smoking status: Never Smoker   . Smokeless tobacco: Never Used  . Alcohol Use: No  . Drug Use: No  . Sexual Activity: Not on file   Other Topics Concern  . Not on file   Social History Narrative      Medication List       This list is accurate as of: 11/25/14 11:59 PM.  Always use your most recent med list.               acetaminophen 325 MG tablet  Commonly known as:  TYLENOL  Take 2 tablets (650 mg total) by mouth every 6 (six) hours as needed for mild pain.     amLODipine 10 MG tablet  Commonly known as:  NORVASC  Take 1 tablet (10 mg total) by mouth daily.     aspirin 325 MG tablet  Take 1 tablet (325  Patient ID: Lindsay Shannon, female   DOB: 1923-08-29, 81 y.o.   MRN: [id number]    Place of Service: [facility]  No Known Allergies  Code Status: Full Code  Goals of Care: Longevity/Long term care  Chief Complaint  Patient presents with  . Medical Management of Chronic Issues    HTN, GERD, old CVA, depression, CKD, dementia, AR    HPI 81 y.o. female with PMH of old cva s/p right hemiparesis, CKD, vascular dementia, HTN, GERD, OA among others is being seen for a routine visit for management of her chronic issues. Weight stable over the past month. No recent fall or skin concerns reported. No change in functional status or behaviors reported. No concerns from staff. Renal function worsen with most recent eGFR 10.85, GERD stable since dose reduction of ppi. Depression stable on lowered dose of remeron and lexapro. HTN stable with norvasc and atenolol. BP range 110s/60s-130s/70s. AR stable on claritin. Seen in wheel chair today. No complaints verbalized by patient   Review of Systems Constitutional: Negative for fever and chills HENT: Negative for ear pain, congestion, and sore throat Eyes: Negative for eye pain and visual disturbance  Cardiovascular: Negative for chest pain and leg swelling Respiratory: Negative cough, shortness of breath, and wheezing.  Gastrointestinal: Negative for nausea and vomiting. Genitourinary: Negative for  dysuria Musculoskeletal: Negative for back pain, joint pain, and joint tenderness Neurological: Negative for dizziness, headache, weakness, and tremors.  Skin: Negative for rash and wound.   Psychiatric: Negative for depression  Past Medical History  Diagnosis Date  . Hypertension   . Stroke 07/2013  . Chronic kidney disease     CKD stage 4  . Refusal of blood transfusions as patient is Jehovah's Witness   . NEOPLASM, BENIGN, STOMACH 01/30/2004    Qualifier: Diagnosis of  By: Laney Potash, Pam    . GASTRITIS 01/30/2004    Qualifier:  Diagnosis of  By: Laney Potash, Pam    . TIA (transient ischemic attack) 07/24/2013  . Benign paroxysmal positional vertigo 01/24/2014  . CATARACTS, BILATERAL 02/08/2008    Qualifier: Diagnosis of  By: Laney Potash, Pam    . Paraproteinemia 09/06/2012    Past Surgical History  Procedure Laterality Date  . Partial hip arthroplasty    . Replacement total knee      History   Social History  . Marital Status: Widowed    Spouse Name: N/A    Number of Children: N/A  . Years of Education: N/A   Occupational History  . Not on file.   Social History Main Topics  . Smoking status: Never Smoker   . Smokeless tobacco: Never Used  . Alcohol Use: No  . Drug Use: No  . Sexual Activity: Not on file   Other Topics Concern  . Not on file   Social History Narrative      Medication List       This list is accurate as of: 11/25/14 11:59 PM.  Always use your most recent med list.               acetaminophen 325 MG tablet  Commonly known as:  TYLENOL  Take 2 tablets (650 mg total) by mouth every 6 (six) hours as needed for mild pain.     amLODipine 10 MG tablet  Commonly known as:  NORVASC  Take 1 tablet (10 mg total) by mouth daily.     aspirin 325 MG tablet  Take 1 tablet (325

## 2014-12-20 ENCOUNTER — Encounter: Payer: Self-pay | Admitting: Registered Nurse

## 2014-12-20 ENCOUNTER — Non-Acute Institutional Stay (SKILLED_NURSING_FACILITY): Payer: Medicare Other | Admitting: Registered Nurse

## 2014-12-20 DIAGNOSIS — N185 Chronic kidney disease, stage 5: Secondary | ICD-10-CM

## 2014-12-20 DIAGNOSIS — F0153 Vascular dementia, unspecified severity, with mood disturbance: Secondary | ICD-10-CM

## 2014-12-20 DIAGNOSIS — D631 Anemia in chronic kidney disease: Secondary | ICD-10-CM

## 2014-12-20 DIAGNOSIS — F0151 Vascular dementia with behavioral disturbance: Secondary | ICD-10-CM

## 2014-12-20 DIAGNOSIS — F329 Major depressive disorder, single episode, unspecified: Secondary | ICD-10-CM

## 2014-12-20 DIAGNOSIS — K219 Gastro-esophageal reflux disease without esophagitis: Secondary | ICD-10-CM

## 2014-12-20 DIAGNOSIS — I10 Essential (primary) hypertension: Secondary | ICD-10-CM

## 2014-12-20 DIAGNOSIS — I69351 Hemiplegia and hemiparesis following cerebral infarction affecting right dominant side: Secondary | ICD-10-CM

## 2014-12-20 DIAGNOSIS — M159 Polyosteoarthritis, unspecified: Secondary | ICD-10-CM

## 2014-12-20 DIAGNOSIS — I69851 Hemiplegia and hemiparesis following other cerebrovascular disease affecting right dominant side: Secondary | ICD-10-CM

## 2014-12-20 DIAGNOSIS — F0150 Vascular dementia without behavioral disturbance: Secondary | ICD-10-CM

## 2014-12-20 DIAGNOSIS — N189 Chronic kidney disease, unspecified: Secondary | ICD-10-CM

## 2014-12-20 DIAGNOSIS — J3089 Other allergic rhinitis: Secondary | ICD-10-CM

## 2014-12-20 NOTE — Progress Notes (Signed)
Patient ID: Lindsay Shannon, female   DOB: 02/03/1923, 81 y.o.   MRN: [id number]    Place of Service: [facility]  No Known Allergies  Code Status: Full Code  Goals of Care: Longevity/Long term care  Chief Complaint  Patient presents with  . Medical Management of Chronic Issues    dementia, CKD stage 5, anemia, HTN, OA, depression, GERD    HPI 81 y.o. female with PMH of old cva s/p right hemiparesis, CKD, vascular dementia, HTN, GERD, OA among others is being seen for a routine visit for management of her chronic issues. Has 3 lb weight loss since last routine visit, but overall stable. No recent fall or skin concerns reported. No change in functional status or behaviors reported. No concerns from staff. CKD is being followed by nephrology-latest eGFR 10.85. GERD stable on ppi. Mood stable on remeron and lexapro. BP soft with range 80s-110s/50s-70s.. AR stable on claritin. Seen in bed today. Denies any concerns  Review of Systems Constitutional: Negative for fever and chills HENT: Negative for ear pain, congestion, and sore throat Eyes: Negative for eye pain and visual disturbance  Cardiovascular: Negative for chest pain and leg swelling Respiratory: Negative cough, shortness of breath, and wheezing.  Gastrointestinal: Negative for nausea and vomiting. Genitourinary: Negative for  dysuria Musculoskeletal: Negative for back pain, joint pain, and joint tenderness Neurological: Negative for dizziness and headache  Skin: Negative for rash and wound.   Psychiatric: Negative for depression  Past Medical History  Diagnosis Date  . Hypertension   . Stroke 07/2013  . Chronic kidney disease     CKD stage 4  . Refusal of blood transfusions as patient is Jehovah's Witness   . NEOPLASM, BENIGN, STOMACH 01/30/2004    Qualifier: Diagnosis of  By: Laney Potash, Pam    . GASTRITIS 01/30/2004    Qualifier: Diagnosis of  By: Laney Potash, Pam    . TIA (transient ischemic attack)  07/24/2013  . Benign paroxysmal positional vertigo 01/24/2014  . CATARACTS, BILATERAL 02/08/2008    Qualifier: Diagnosis of  By: Laney Potash, Pam    . Paraproteinemia 09/06/2012    Past Surgical History  Procedure Laterality Date  . Partial hip arthroplasty    . Replacement total knee      History   Social History  . Marital Status: Widowed    Spouse Name: N/A  . Number of Children: N/A  . Years of Education: N/A   Occupational History  . Not on file.   Social History Main Topics  . Smoking status: Never Smoker   . Smokeless tobacco: Never Used  . Alcohol Use: No  . Drug Use: No  . Sexual Activity: Not on file   Other Topics Concern  . Not on file   Social History Narrative      Medication List       This list is accurate as of: 12/20/14  3:53 PM.  Always use your most recent med list.               acetaminophen 325 MG tablet  Commonly known as:  TYLENOL  Take 2 tablets (650 mg total) by mouth every 6 (six) hours as needed for mild pain.     amLODipine 5 MG tablet  Commonly known as:  NORVASC  Take 5 mg by mouth daily.     aspirin 325 MG tablet  Take 1 tablet (325 mg total) by mouth daily.     atenolol 50 MG  tablet  Commonly known as:  TENORMIN  Take 50 mg by mouth daily.     donepezil 10 MG tablet  Commonly known as:  ARICEPT  Take 0.5 tablets (5 mg total) by mouth at bedtime. 1/2 tablet for 1 month then will increase to 1 tablet qhs     escitalopram 10 MG tablet  Commonly known as:  LEXAPRO  Take 10 mg by mouth daily.     ipratropium-albuterol 0.5-2.5 (3) MG/3ML Soln  Commonly known as:  DUONEB  Take 3 mLs by nebulization every 8 (eight) hours as needed.     loratadine 10 MG tablet  Commonly known as:  CLARITIN  Take 10 mg by mouth daily.     mirtazapine 7.5 MG tablet  Commonly known as:  REMERON  Take 7.5 mg by mouth at bedtime.     pantoprazole 20 MG tablet  Commonly known as:  PROTONIX  Take 20 mg by mouth daily.      traMADol-acetaminophen 37.5-325 MG per tablet  Commonly known as:  ULTRACET  Take one tablet by mouth twice daily for osteoarthritis of multiple joints and pain        Physical Exam  BP 110/62 mmHg  Pulse 76  Temp(Src) 97 F (36.1 C)  Resp 16  Ht $R'5\' 1"'IH$  (1.549 m)  Wt 123 lb 3.2 oz (55.883 kg)  BMI 23.29 kg/m2   Constitutional: WDWN elderly female in no acute distress. Conversant and pleasant.  HEENT: Normocephalic and atraumatic. PERRL. EOM intact. No scleral icterus. No nasal discharge or sinus tenderness. Oral mucosa moist. Posterior pharynx clear of any exudate or lesions.  Neck: Supple and nontender. No lymphadenopathy, masses, or thyromegaly. No JVD or carotid bruits. Cardiac: Normal S1, S2. RRR without appreciable murmurs, rubs, or gallops. Distal pulses intact. No dependent edema.  Lungs: No respiratory distress. Breath sounds clear bilaterally without rales, rhonchi, or wheezes. Abdomen: Audible bowel sounds in all quadrants. Soft, nontender, nondistended.  Musculoskeletal: Able to move all extremities. Right hemiparesis present.  Skin: Warm and dry. No rash noted. Neurological: Alert and oriented to self Psychiatric: Appropriate mood and affect.   Labs Reviewed  CBC Latest Ref Rng 10/25/2014 05/02/2014 04/29/2014  WBC - 6.9 7.0 5.9  Hemoglobin 12.0 - 16.0 g/dL 9.1(A) 7.9(A) 7.5(A)  Hematocrit 36 - 46 % 30(A) 26(A) 26(A)  Platelets 150 - 399 K/L 235 263 277    CMP Latest Ref Rng 10/25/2014 04/29/2014 12/12/2013  Glucose 70 - 99 mg/dL - - 84  BUN 4 - 21 mg/dL 51(A) 64(A) 38(H)  Creatinine 0.5 - 1.1 mg/dL 4.9(A) 5.0(A) 3.24(H)  Sodium 137 - 147 mmol/L 140 140 147  Potassium 3.4 - 5.3 mmol/L 4.7 5.3 4.4  Chloride 96 - 112 mEq/L - - 116(H)  CO2 19 - 32 mEq/L - - 17(L)  Calcium 8.4 - 10.5 mg/dL - - 8.0(L)  Total Protein 6.0 - 8.3 g/dL - - -  Total Bilirubin 0.3 - 1.2 mg/dL - - -  Alkaline Phos 25 - 125 U/L - 75 -  AST 13 - 35 U/L - 14 -  ALT 7 - 35 U/L - 11 -    Lipid Panel     Component Value Date/Time   CHOL 168 10/25/2014   CHOL 168 10/25/2014   TRIG 126 10/25/2014   TRIG 100 10/25/2014   HDL 42 10/25/2014   HDL 42 10/25/2014   CHOLHDL 2.8 07/25/2013 0440   VLDL 11 07/25/2013 0440   LDLCALC 61 10/25/2014   LDLCALC  106 10/25/2014    Assessment & Plan 1. HTN BP soft. Continue atenolol 50mg  daily. Will decrease norvasc from 10mg  to 5mg  daily. Nursing staff to monitor BP and HR. Continue to monitor her status.   2. allergic rhinitis Stable. Continue claritin 10mg  daily and monitior.   3. Hemiparesis affecting right side as late effect of cerebrovascular accident Stable. Continue asa 325mg  daily. Monitor clinically  4. Gastroesophageal reflux disease without esophagitis Stable. Continue protonix 20mg  daily and monitor  5. Osteoarthritis of multiple joints, unspecified osteoarthritis type Stable. Continue ultracet 37.5/325mg  twice daily and tylenol 650mg  every six hours as needed for pain (do not exceed 4g of tylenol in 24 hour period). Continue to monitor  6. Dementia with depression Stable. Continue aricept 10mg  daily with  lexapro 10mg  daily and remeron 7.5mg  daily at bedtime for mood. Continue to monitor for change in behavior/mood. Continue fall risk and pressure ulcer precautions.   7. CKD, stage V Recent eGFR 10.85. Continue to f/u with nephrology for renal concerns. Continue to monitor renal function and her status.   8. Anemia in CKD Stable. Most recent hgb/hct 9.1/30. Continue to monitor h&h   Family/Staff Communication Plan of care discussed with nursing staff. Nursing staff verbalized understanding and agree with plan of care. No additional questions or concerns reported.    Arthur Holms, MSN, AGNP-C [facility] [address] [place], [place] [address] [phone] [8am-5pm] After hours: [phone]

## 2015-01-16 ENCOUNTER — Non-Acute Institutional Stay (SKILLED_NURSING_FACILITY): Payer: Medicare Other | Admitting: Internal Medicine

## 2015-01-16 DIAGNOSIS — N189 Chronic kidney disease, unspecified: Secondary | ICD-10-CM

## 2015-01-16 DIAGNOSIS — I69851 Hemiplegia and hemiparesis following other cerebrovascular disease affecting right dominant side: Secondary | ICD-10-CM | POA: Diagnosis not present

## 2015-01-16 DIAGNOSIS — K219 Gastro-esophageal reflux disease without esophagitis: Secondary | ICD-10-CM | POA: Diagnosis not present

## 2015-01-16 DIAGNOSIS — F0390 Unspecified dementia without behavioral disturbance: Secondary | ICD-10-CM

## 2015-01-16 DIAGNOSIS — D631 Anemia in chronic kidney disease: Secondary | ICD-10-CM

## 2015-01-16 DIAGNOSIS — I150 Renovascular hypertension: Secondary | ICD-10-CM

## 2015-01-16 DIAGNOSIS — F329 Major depressive disorder, single episode, unspecified: Secondary | ICD-10-CM

## 2015-01-16 DIAGNOSIS — F0393 Unspecified dementia, unspecified severity, with mood disturbance: Secondary | ICD-10-CM | POA: Insufficient documentation

## 2015-01-16 DIAGNOSIS — I69351 Hemiplegia and hemiparesis following cerebral infarction affecting right dominant side: Secondary | ICD-10-CM

## 2015-01-16 DIAGNOSIS — F0280 Dementia in other diseases classified elsewhere without behavioral disturbance: Secondary | ICD-10-CM | POA: Diagnosis not present

## 2015-01-16 NOTE — Progress Notes (Signed)
Patient ID: Lindsay Shannon, female   DOB: November 19, 1922, 81 y.o.   MRN: [id number]    Facility: [facility]  Chief complaint: medical management of routine health problems  Allergies: reviewed, NKDA  Code status: full code  HPI 81 y/o female pt is seen for routine visit. She has been at her baseline as per staff. She is seen in her room today. She denies any concerns. 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 No recent fall or skin concerns reported. No change in functional status or behaviors reported. No concerns from staff.   Review of Systems  Constitutional: Negative for fever, chills, malaise/fatigue and diaphoresis.  HENT: Negative for congestion, hearing loss and sore throat.   Eyes: Negative for blurred vision. Respiratory: Negative for cough, sputum production, shortness of breath and wheezing.   Cardiovascular: Negative for chest pain, palpitations, leg swelling.  Gastrointestinal: Negative for heartburn, nausea, vomiting, abdominal pain. Genitourinary: Negative for dysuria. Musculoskeletal: Negative for back pain. Skin: Negative for itching and rash.  Neurological: Negative for dizziness Psychiatric/Behavioral: has memory loss. The patient is not nervous/anxious.    Past Medical History  Diagnosis Date  . Hypertension   . Stroke 07/2013  . Chronic kidney disease     CKD stage 4  . Refusal of blood transfusions as patient is Jehovah's Witness   . NEOPLASM, BENIGN, STOMACH 01/30/2004    Qualifier: Diagnosis of  By: Laney Potash, Pam    . GASTRITIS 01/30/2004    Qualifier: Diagnosis of  By: Laney Potash, Pam    . TIA (transient ischemic attack) 07/24/2013  . Benign paroxysmal positional vertigo 01/24/2014  . CATARACTS, BILATERAL 02/08/2008    Qualifier: Diagnosis of  By: Laney Potash, Pam    . Paraproteinemia 09/06/2012   Medication reviewed. See [facility]  Physical exam BP 102/62 mmHg   Pulse 71  Temp(Src) 98 F (36.7 C)  Resp 18  SpO2 96%  General- elderly female in no acute distress Head- atraumatic, normocephalic Eyes- PERRLA, EOMI, no pallor, no icterus, no discharge Neck- no lymphadenopathy Throat- moist mucus membrane Cardiovascular- normal s1,s2, no murmurs Respiratory- bilateral clear to auscultation, no wheeze, no rhonchi, no crackles, no use of accessory muscles Abdomen- bowel sounds present, soft, non tender Musculoskeletal- able to move her left extremities, right sided hemiparesis (old), no leg edema Skin- warm and dry Psychiatry- alert, normal mood and affect  Labs 12-12-13: wbc 6.1; hgb 7.4; hct 22.9; mcv 90.2; plt 269; glucose 84; bun 38; creat 3.24; k+4.4; na++147 04-29-14: wbc 5.9; hgb 7.5; hct 24.9; mcv 93.3; plt 277; glucose 80; bun 64; creat 5.0; k+5.3; na++ 140; liver normal albulmin 2.8 05-02-14: wbc 7.0; hgb 7.9; hct 25.8; mcv 93.5;plt 263    05-06-14: glucose 79; bun 62; creat 5.0; k 5.4; na++139 06-20-14: glucose 76; bun 45; creat 4.5;k+5.2; na++141  10-25-14 wbc 6.9, hb 9.1, hct 29.7, mcv 98, plt 235,na 140, k 4.7, glu 73, bun 51, cr 4.9, lft wnl, alb 3.4  Assessment/plan  Hemiparesis affecting right side as late effect of cerebrovascular accident Stable. Continue atenolol and asa 325mg  daily. Monitor clinically  Dementia without behavioral disturbance Stable. Continue aricept 10mg  daily with  lexapro 10mg  daily and remeron 7.5mg  daily at bedtime for mood. Continue to monitor for behavior changes. Continue fall risk and pressure ulcer precautions.   HTN Stable but most of bp reading on softer side. Continue atenolol 50mg  daily. D/c norvasc.  Depression due to dementia Continue remeron and lexapro current regimen   Gastroesophageal reflux disease without esophagitis Stable. Continue protonix 20mg  daily.  Anemia in CKD Stable. Most recent hgb/hct 9.1/30. Continue to monitor h&h and f/u with renal

## 2015-01-27 LAB — LIPID PANEL
Cholesterol: 171 mg/dL (ref 0–200)
HDL: 42 mg/dL (ref 35–70)
Triglycerides: 106 mg/dL (ref 40–160)

## 2015-03-06 ENCOUNTER — Non-Acute Institutional Stay (SKILLED_NURSING_FACILITY): Payer: Medicare Other | Admitting: Internal Medicine

## 2015-03-06 DIAGNOSIS — E46 Unspecified protein-calorie malnutrition: Secondary | ICD-10-CM | POA: Diagnosis not present

## 2015-03-06 DIAGNOSIS — J309 Allergic rhinitis, unspecified: Secondary | ICD-10-CM | POA: Diagnosis not present

## 2015-03-06 DIAGNOSIS — D638 Anemia in other chronic diseases classified elsewhere: Secondary | ICD-10-CM | POA: Diagnosis not present

## 2015-03-06 NOTE — Progress Notes (Signed)
Patient ID: Lindsay Shannon, female   DOB: December 08, 1922, 81 y.o.   MRN: [id number]    Facility: [facility]  Chief complaint: medical management of routine health problems  Allergies: reviewed, NKDA  Code status: full code  HPI 81 y/o female pt is seen for routine visit. She has been at her baseline as per staff. She is seen in her room today. She denies any concerns.She has PMH of old cva s/p right hemiparesis, CKD, vascular dementia, HTN, GERD, OA. Losing some weight. No falls reported  Review of Systems  Constitutional: Negative for fever, chills and diaphoresis.  HENT: Negative for congestion, hearing loss and sore throat.   Eyes: Negative for blurred vision. Respiratory: Negative for cough, sputum production, shortness of breath and wheezing.   Cardiovascular: Negative for chest pain, palpitations, leg swelling.  Gastrointestinal: Negative for heartburn, nausea, vomiting, abdominal pain. Genitourinary: Negative for dysuria. Skin: Negative for itching and rash.  Neurological: Negative for dizziness Psychiatric/Behavioral: has memory loss. The patient is not nervous/anxious.    Past Medical History  Diagnosis Date  . Hypertension   . Stroke 07/2013  . Chronic kidney disease     CKD stage 4  . Refusal of blood transfusions as patient is Jehovah's Witness   . NEOPLASM, BENIGN, STOMACH 01/30/2004    Qualifier: Diagnosis of  By: Laney Potash, Pam    . GASTRITIS 01/30/2004    Qualifier: Diagnosis of  By: Laney Potash, Pam    . TIA (transient ischemic attack) 07/24/2013  . Benign paroxysmal positional vertigo 01/24/2014  . CATARACTS, BILATERAL 02/08/2008    Qualifier: Diagnosis of  By: Laney Potash, Pam    . Paraproteinemia 09/06/2012   Medication reviewed. See [facility]  Physical exam BP 115/66 mmHg  Pulse 60  Temp(Src) 97 F (36.1 C)  Resp 18  Ht 5\' 1"  (1.549 m)  Wt 121 lb 11.2 oz (55.203 kg)  BMI 23.01 kg/m2  Wt Readings from Last 3  Encounters:  03/06/15 121 lb 11.2 oz (55.203 kg)  12/20/14 123 lb 3.2 oz (55.883 kg)  11/25/14 126 lb 3.2 oz (57.244 kg)   General- elderly female in no acute distress Head- atraumatic, normocephalic Eyes- PERRLA, EOMI, no pallor, no icterus, no discharge Neck- no lymphadenopathy Throat- moist mucus membrane Cardiovascular- normal s1,s2, no murmurs Respiratory- bilateral clear to auscultation, no wheeze, no rhonchi, no crackles, no use of accessory muscles Abdomen- bowel sounds present, soft, non tender Musculoskeletal- able to move her left extremities, right sided hemiparesis (old), no leg edema Skin- warm and dry Psychiatry- alert, normal mood and affect  Labs 12-12-13: wbc 6.1; hgb 7.4; hct 22.9; mcv 90.2; plt 269; glucose 84; bun 38; creat 3.24; k+4.4; na++147 04-29-14: wbc 5.9; hgb 7.5; hct 24.9; mcv 93.3; plt 277; glucose 80; bun 64; creat 5.0; k+5.3; na++ 140; liver normal albulmin 2.8 05-02-14: wbc 7.0; hgb 7.9; hct 25.8; mcv 93.5;plt 263    05-06-14: glucose 79; bun 62; creat 5.0; k 5.4; na++139 06-20-14: glucose 76; bun 45; creat 4.5;k+5.2; na++141   10-25-14 wbc 6.9, hb 9.1, hct 29.7, mcv 98, plt 235,na 140, k 4.7, glu 73, bun 51, cr 4.9, lft wnl, alb 3.4 01-27-15 hb 10.2   Assessment/plan  Allergic rhinitis Stable, continue claritin 10 mg daily  Protein calorie malnutrition Losing weight. On remeron 7.5 mg daily with medpass and magic cup. Monitor po intake. Decline anticipated with her dementia. Monitor weight. Consider discontinuing aricept after talking with RP given her weight  loss and limited benefit of aricept at this point.  Anemia of chronic disease Monitor h&h. Her aspirin was reduced to 81 mg daily. Tolerating it well. Consider discontinuing aricept after discussing with RP given her anemia and CKD. Hb stable in the last blood draw

## 2015-03-31 ENCOUNTER — Non-Acute Institutional Stay (SKILLED_NURSING_FACILITY): Payer: Medicare Other | Admitting: Internal Medicine

## 2015-03-31 DIAGNOSIS — E038 Other specified hypothyroidism: Secondary | ICD-10-CM | POA: Diagnosis not present

## 2015-03-31 DIAGNOSIS — I150 Renovascular hypertension: Secondary | ICD-10-CM

## 2015-03-31 DIAGNOSIS — F0151 Vascular dementia with behavioral disturbance: Secondary | ICD-10-CM

## 2015-03-31 DIAGNOSIS — F0150 Vascular dementia without behavioral disturbance: Secondary | ICD-10-CM

## 2015-03-31 DIAGNOSIS — F329 Major depressive disorder, single episode, unspecified: Secondary | ICD-10-CM | POA: Diagnosis not present

## 2015-03-31 DIAGNOSIS — I69391 Dysphagia following cerebral infarction: Secondary | ICD-10-CM | POA: Diagnosis not present

## 2015-03-31 DIAGNOSIS — E039 Hypothyroidism, unspecified: Secondary | ICD-10-CM

## 2015-03-31 DIAGNOSIS — K219 Gastro-esophageal reflux disease without esophagitis: Secondary | ICD-10-CM

## 2015-03-31 DIAGNOSIS — F0153 Vascular dementia, unspecified severity, with mood disturbance: Secondary | ICD-10-CM | POA: Insufficient documentation

## 2015-03-31 NOTE — Progress Notes (Signed)
Patient ID: Lindsay Shannon, female   DOB: Oct 24, 1923, 81 y.o.   MRN: [id number]     Facility: [facility]  Chief complaint: medical management of routine health problems  Allergies: reviewed, NKDA  Code status: full code  HPI 81 y/o female patient is seen for routine visit. She has been at her baseline as per staff. She denies any concerns. 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 No recent fall or skin concerns reported. No change in functional status or behaviors reported. No concerns from staff.   Review of Systems  Constitutional: Negative for fever, chills HENT: Negative for congestion, hearing loss and sore throat.   Eyes: Negative for blurred vision. Respiratory: Negative for cough, sputum production, shortness of breath and wheezing.   Cardiovascular: Negative for chest pain, palpitations, leg swelling.  Gastrointestinal: Negative for heartburn, nausea, vomiting, abdominal pain. Genitourinary: Negative for dysuria. Musculoskeletal: Negative for back pain. Skin: Negative for itching and rash.  Neurological: Negative for dizziness Psychiatric/Behavioral: has memory loss. The patient is not nervous/anxious.    Past Medical History  Diagnosis Date  . Hypertension   . Stroke 07/2013  . Chronic kidney disease     CKD stage 4  . Refusal of blood transfusions as patient is Jehovah's Witness   . NEOPLASM, BENIGN, STOMACH 01/30/2004    Qualifier: Diagnosis of  By: Laney Potash, Pam    . GASTRITIS 01/30/2004    Qualifier: Diagnosis of  By: Laney Potash, Pam    . TIA (transient ischemic attack) 07/24/2013  . Benign paroxysmal positional vertigo 01/24/2014  . CATARACTS, BILATERAL 02/08/2008    Qualifier: Diagnosis of  By: Laney Potash, Pam    . Paraproteinemia 09/06/2012   Medication reviewed. See [facility]   Medication List       This list is accurate as of: 03/31/15 12:18 PM.  Always use your most  recent med list.               acetaminophen 325 MG tablet  Commonly known as:  TYLENOL  Take 2 tablets (650 mg total) by mouth every 6 (six) hours as needed for mild pain.     aspirin 325 MG tablet  Take 1 tablet (325 mg total) by mouth daily.     atenolol 50 MG tablet  Commonly known as:  TENORMIN  Take 50 mg by mouth daily.     donepezil 10 MG tablet  Commonly known as:  ARICEPT  Take 0.5 tablets (5 mg total) by mouth at bedtime. 1/2 tablet for 1 month then will increase to 1 tablet qhs     escitalopram 10 MG tablet  Commonly known as:  LEXAPRO  Take 10 mg by mouth daily.     ipratropium-albuterol 0.5-2.5 (3) MG/3ML Soln  Commonly known as:  DUONEB  Take 3 mLs by nebulization every 8 (eight) hours as needed.     loratadine 10 MG tablet  Commonly known as:  CLARITIN  Take 10 mg by mouth daily.     mirtazapine 7.5 MG tablet  Commonly known as:  REMERON  Take 7.5 mg by mouth at bedtime.     pantoprazole 20 MG tablet  Commonly known as:  PROTONIX  Take 20 mg by mouth daily.     traMADol-acetaminophen 37.5-325 MG per tablet  Commonly known as:  ULTRACET  Take one tablet by mouth twice daily for osteoarthritis of multiple joints and pain  Physical exam BP 141/70 mmHg  Pulse 55  Temp(Src) 97.2 F (36.2 C)  Resp 16  Ht 5\' 1"  (1.549 m)  Wt 120 lb 12.8 oz (54.795 kg)  BMI 22.84 kg/m2  SpO2 91%  Wt Readings from Last 3 Encounters:  03/31/15 120 lb 12.8 oz (54.795 kg)  03/06/15 121 lb 11.2 oz (55.203 kg)  12/20/14 123 lb 3.2 oz (55.883 kg)   General- elderly female in no acute distress Head- atraumatic, normocephalic Eyes- PERRLA, EOMI, no pallor, no icterus, no discharge Neck- no lymphadenopathy Throat- moist mucus membrane Cardiovascular- normal s1,s2, no murmurs Respiratory- bilateral clear to auscultation, no wheeze, no rhonchi, no crackles, no use of accessory muscles Abdomen- bowel sounds present, soft, non tender Musculoskeletal- able to  move her left extremities, right sided hemiparesis (old), no leg edema Skin- warm and dry Psychiatry- alert, normal mood and affect  Labs 12-12-13: wbc 6.1; hgb 7.4; hct 22.9; mcv 90.2; plt 269; glucose 84; bun 38; creat 3.24; k+4.4; na++147 04-29-14: wbc 5.9; hgb 7.5; hct 24.9; mcv 93.3; plt 277; glucose 80; bun 64; creat 5.0; k+5.3; na++ 140; liver normal albulmin 2.8 05-02-14: wbc 7.0; hgb 7.9; hct 25.8; mcv 93.5;plt 263    05-06-14: glucose 79; bun 62; creat 5.0; k 5.4; na++139 06-20-14: glucose 76; bun 45; creat 4.5;k+5.2; na++141   10-25-14 wbc 6.9, hb 9.1, hct 29.7, mcv 98, plt 235,na 140, k 4.7, glu 73, bun 51, cr 4.9, lft wnl, alb 3.4 01-27-15 hb 10.2, hct 32.1, wbc 5.7, plt 253, iron 46, tibc 204, b12 591, folate 9.3, t.chol 171, tg 106, hdl 42, ldl 108, tsh 5.198  Assessment/plan  Dysphagia related to cva Continue mechanical soft diet with aspiration precautions  Hypertension Continue atenolol 50mg  daily. Monitor bp  Vascular dementia with depression Stable continue lexapro 10 mg daily and remeron 7.5 mg daily with aricept 10 mg daily  gerd On protonix 20 mg daily, denies any symptoms this visit. With hx of dementia and ckd and controlled symptom, change protonix to daily prn only for now and reassess  Subclinical hypothyroidism Reviewed tsh from 3/16. Recheck complete thyroid panel to assess further  Blanchie Serve, MD  [place] (Monday-Friday 8 am - 5 pm) [phone] (afterhours)

## 2015-04-01 LAB — TSH: TSH: 2.19 u[IU]/mL (ref <=5.90)

## 2015-04-28 ENCOUNTER — Other Ambulatory Visit: Payer: Self-pay | Admitting: *Deleted

## 2015-04-28 DIAGNOSIS — M1612 Unilateral primary osteoarthritis, left hip: Secondary | ICD-10-CM

## 2015-04-28 LAB — LIPID PANEL: LDL Cholesterol: 108 mg/dL

## 2015-04-28 LAB — HEPATIC FUNCTION PANEL
AST: 12 U/L — AB (ref 13–35)
Alkaline Phosphatase: 65 U/L (ref 25–125)
BILIRUBIN, TOTAL: 0.3 mg/dL

## 2015-04-28 LAB — BASIC METABOLIC PANEL
BUN: 46 mg/dL — AB (ref 4–21)
CREATININE: 4.5 mg/dL — AB (ref 0.5–1.1)
Glucose: 77 mg/dL
Potassium: 5.1 mmol/L (ref 3.4–5.3)
Sodium: 145 mmol/L (ref 137–147)

## 2015-04-28 LAB — CBC AND DIFFERENTIAL
HCT: 31 % — AB (ref 36–46)
Hemoglobin: 10.3 g/dL — AB (ref 12.0–16.0)
Platelets: 214 10*3/uL (ref 150–399)
WBC: 6.4 10^3/mL

## 2015-04-28 MED ORDER — TRAMADOL-ACETAMINOPHEN 37.5-325 MG PO TABS: ORAL_TABLET | ORAL | Status: AC

## 2015-04-28 NOTE — Telephone Encounter (Signed)
[facility] 

## 2015-05-05 ENCOUNTER — Non-Acute Institutional Stay (SKILLED_NURSING_FACILITY): Payer: Medicare Other | Admitting: Internal Medicine

## 2015-05-05 DIAGNOSIS — J3089 Other allergic rhinitis: Secondary | ICD-10-CM

## 2015-05-05 DIAGNOSIS — M159 Polyosteoarthritis, unspecified: Secondary | ICD-10-CM | POA: Diagnosis not present

## 2015-05-05 DIAGNOSIS — F329 Major depressive disorder, single episode, unspecified: Secondary | ICD-10-CM | POA: Insufficient documentation

## 2015-05-05 DIAGNOSIS — F322 Major depressive disorder, single episode, severe without psychotic features: Secondary | ICD-10-CM | POA: Diagnosis not present

## 2015-05-05 NOTE — Progress Notes (Signed)
Patient ID: Lindsay Shannon, female   DOB: 29-Apr-1923, 81 y.o.   MRN: [id number]      Facility: [facility]  Chief complaint: medical management of routine health problems  Allergies: reviewed, NKDA  Code status: full code  HPI 81 y/o female patient is seen for routine visit. She has been at her baseline as per staff. She denies any concerns. 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 No recent fall or skin concerns reported. No change in functional status or behaviors reported. No concerns from staff.   Review of Systems  Constitutional: Negative for fever, chills HENT: Negative for congestion, hearing loss and sore throat.   Eyes: Negative for blurred vision. Respiratory: Negative for cough, sputum production, shortness of breath and wheezing.   Cardiovascular: Negative for chest pain, palpitations, leg swelling.  Gastrointestinal: Negative for heartburn, nausea, vomiting, abdominal pain. Genitourinary: Negative for dysuria. Musculoskeletal: Negative for back pain. Skin: Negative for itching and rash.  Neurological: Negative for dizziness Psychiatric/Behavioral: has memory loss. The patient is not nervous/anxious.    Past Medical History  Diagnosis Date  . Hypertension   . Stroke 07/2013  . Chronic kidney disease     CKD stage 4  . Refusal of blood transfusions as patient is Jehovah's Witness   . NEOPLASM, BENIGN, STOMACH 01/30/2004    Qualifier: Diagnosis of  By: Laney Potash, Pam    . GASTRITIS 01/30/2004    Qualifier: Diagnosis of  By: Laney Potash, Pam    . TIA (transient ischemic attack) 07/24/2013  . Benign paroxysmal positional vertigo 01/24/2014  . CATARACTS, BILATERAL 02/08/2008    Qualifier: Diagnosis of  By: Laney Potash, Pam    . Paraproteinemia 09/06/2012   Medication reviewed. See [facility]   Medication List       This list is accurate as of: 05/05/15 12:38 PM.  Always use your  most recent med list.               acetaminophen 325 MG tablet  Commonly known as:  TYLENOL  Take 2 tablets (650 mg total) by mouth every 6 (six) hours as needed for mild pain.     aspirin 325 MG tablet  Take 1 tablet (325 mg total) by mouth daily.     atenolol 50 MG tablet  Commonly known as:  TENORMIN  Take 50 mg by mouth daily.     donepezil 10 MG tablet  Commonly known as:  ARICEPT  Take 0.5 tablets (5 mg total) by mouth at bedtime. 1/2 tablet for 1 month then will increase to 1 tablet qhs     escitalopram 10 MG tablet  Commonly known as:  LEXAPRO  Take 10 mg by mouth daily.     ipratropium-albuterol 0.5-2.5 (3) MG/3ML Soln  Commonly known as:  DUONEB  Take 3 mLs by nebulization every 8 (eight) hours as needed.     loratadine 10 MG tablet  Commonly known as:  CLARITIN  Take 10 mg by mouth daily.     mirtazapine 7.5 MG tablet  Commonly known as:  REMERON  Take 7.5 mg by mouth at bedtime.     pantoprazole 20 MG tablet  Commonly known as:  PROTONIX  Take 20 mg by mouth daily.     traMADol-acetaminophen 37.5-325 MG per tablet  Commonly known as:  ULTRACET  Take one tablet by mouth twice daily for osteoarthritis of multiple joints and pain. Do  not exceed 4gm of Tylenol in 24 hours        Physical exam BP 110/68 mmHg  Pulse 64  Temp(Src) 97 F (36.1 C)  Resp 16  SpO2 96%  Wt Readings from Last 3 Encounters:  03/31/15 120 lb 12.8 oz (54.795 kg)  03/06/15 121 lb 11.2 oz (55.203 kg)  12/20/14 123 lb 3.2 oz (55.883 kg)   General- elderly female in no acute distress Head- atraumatic, normocephalic Eyes- PERRLA, EOMI, no pallor, no icterus, no discharge Neck- no lymphadenopathy Throat- moist mucus membrane Cardiovascular- normal s1,s2, no murmurs Respiratory- bilateral clear to auscultation, no wheeze, no rhonchi, no crackles, no use of accessory muscles Abdomen- bowel sounds present, soft, non tender Musculoskeletal- able to move her left extremities,  right sided hemiparesis (old), no leg edema Skin- warm and dry Psychiatry- alert, normal mood and affect  Labs 12-12-13: wbc 6.1; hgb 7.4; hct 22.9; mcv 90.2; plt 269; glucose 84; bun 38; creat 3.24; k+4.4; na++147 04-29-14: wbc 5.9; hgb 7.5; hct 24.9; mcv 93.3; plt 277; glucose 80; bun 64; creat 5.0; k+5.3; na++ 140; liver normal albulmin 2.8 05-02-14: wbc 7.0; hgb 7.9; hct 25.8; mcv 93.5;plt 263    05-06-14: glucose 79; bun 62; creat 5.0; k 5.4; na++139 06-20-14: glucose 76; bun 45; creat 4.5;k+5.2; na++141   10-25-14 wbc 6.9, hb 9.1, hct 29.7, mcv 98, plt 235,na 140, k 4.7, glu 73, bun 51, cr 4.9, lft wnl, alb 3.4 01-27-15 hb 10.2, hct 32.1, wbc 5.7, plt 253, iron 46, tibc 204, b12 591, folate 9.3, t.chol 171, tg 106, hdl 42, ldl 108, tsh 5.198  Assessment/plan  Allergic rhinitis Stable, continue claritin 10 mg daily  Depression Stable, continue lexapro and remeron for now  OA Stabe, continue vit d supplement. Continue ultracet 37.5/325 bid    Blanchie Serve, MD  [facility] [phone] (Monday-Friday 8 am - 5 pm) [phone] (afterhours)

## 2015-05-09 IMAGING — CR DG HIP COMPLETE 2+V*R*
2 series · 2 of 2 positions shown · non-contrast
Comparison: None.

CLINICAL DATA: Pain

EXAM:
RIGHT HIP - COMPLETE 2+ VIEW

[t hip ap right]
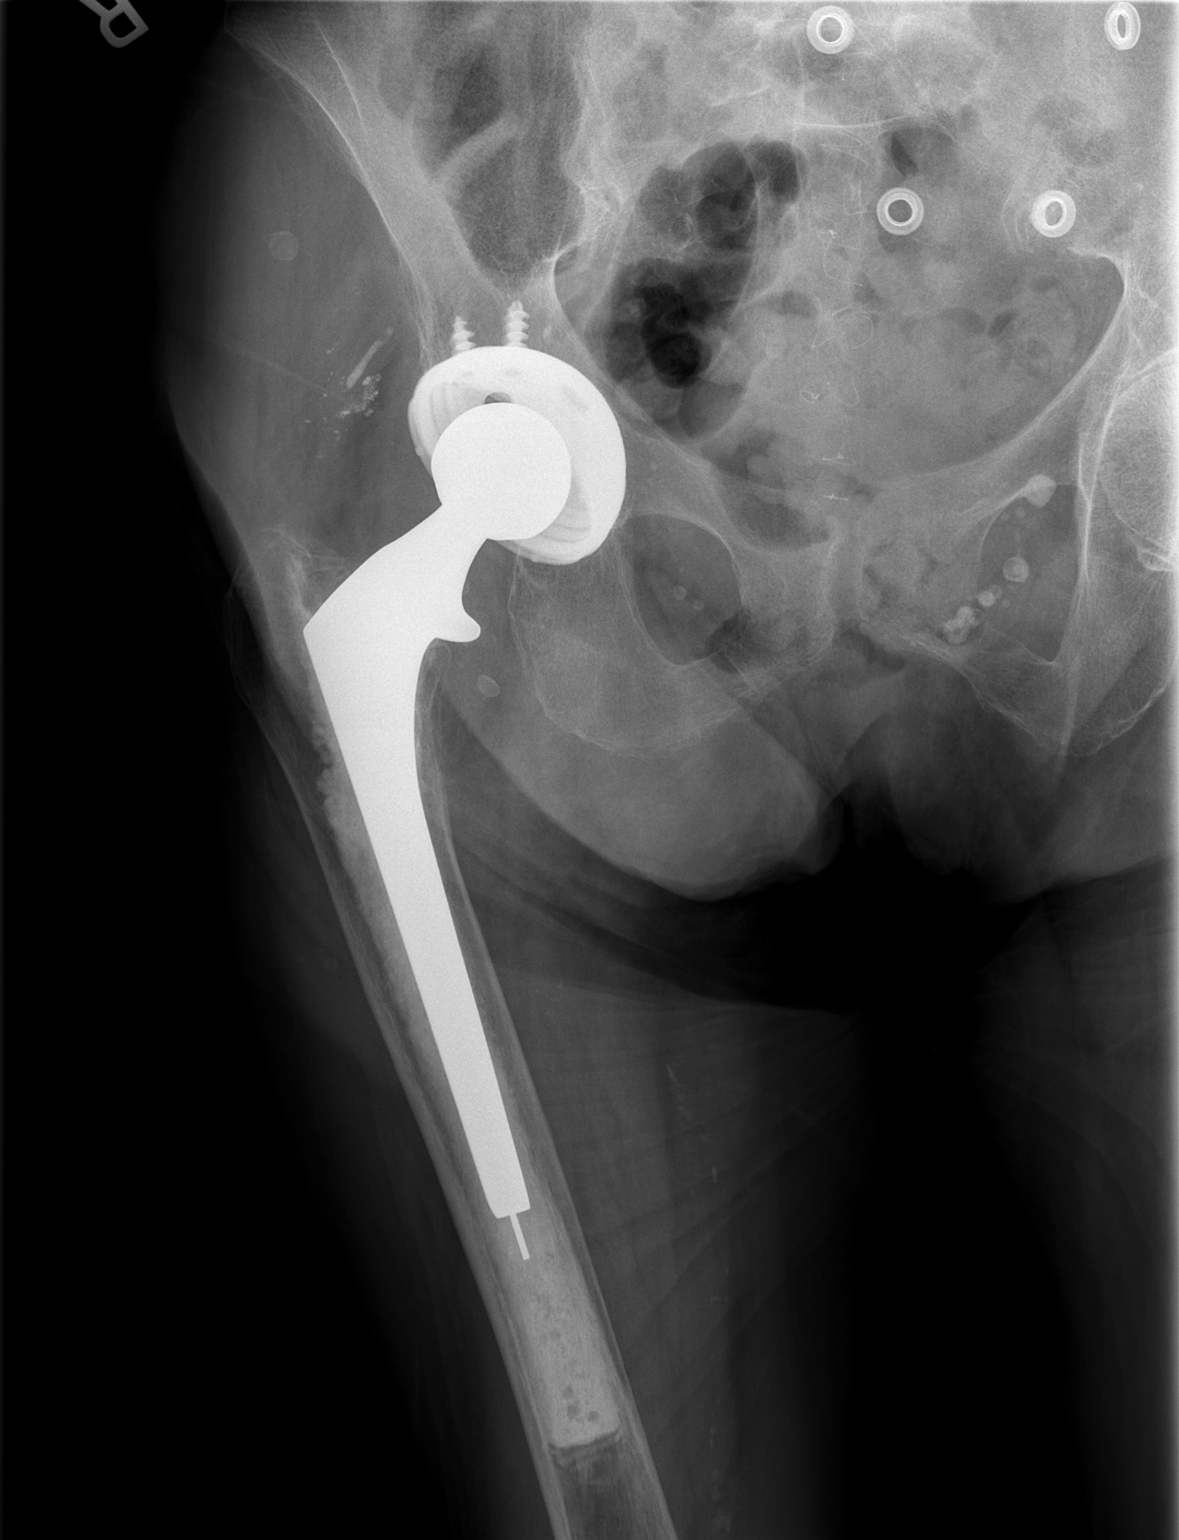

[t hip frog leg right]
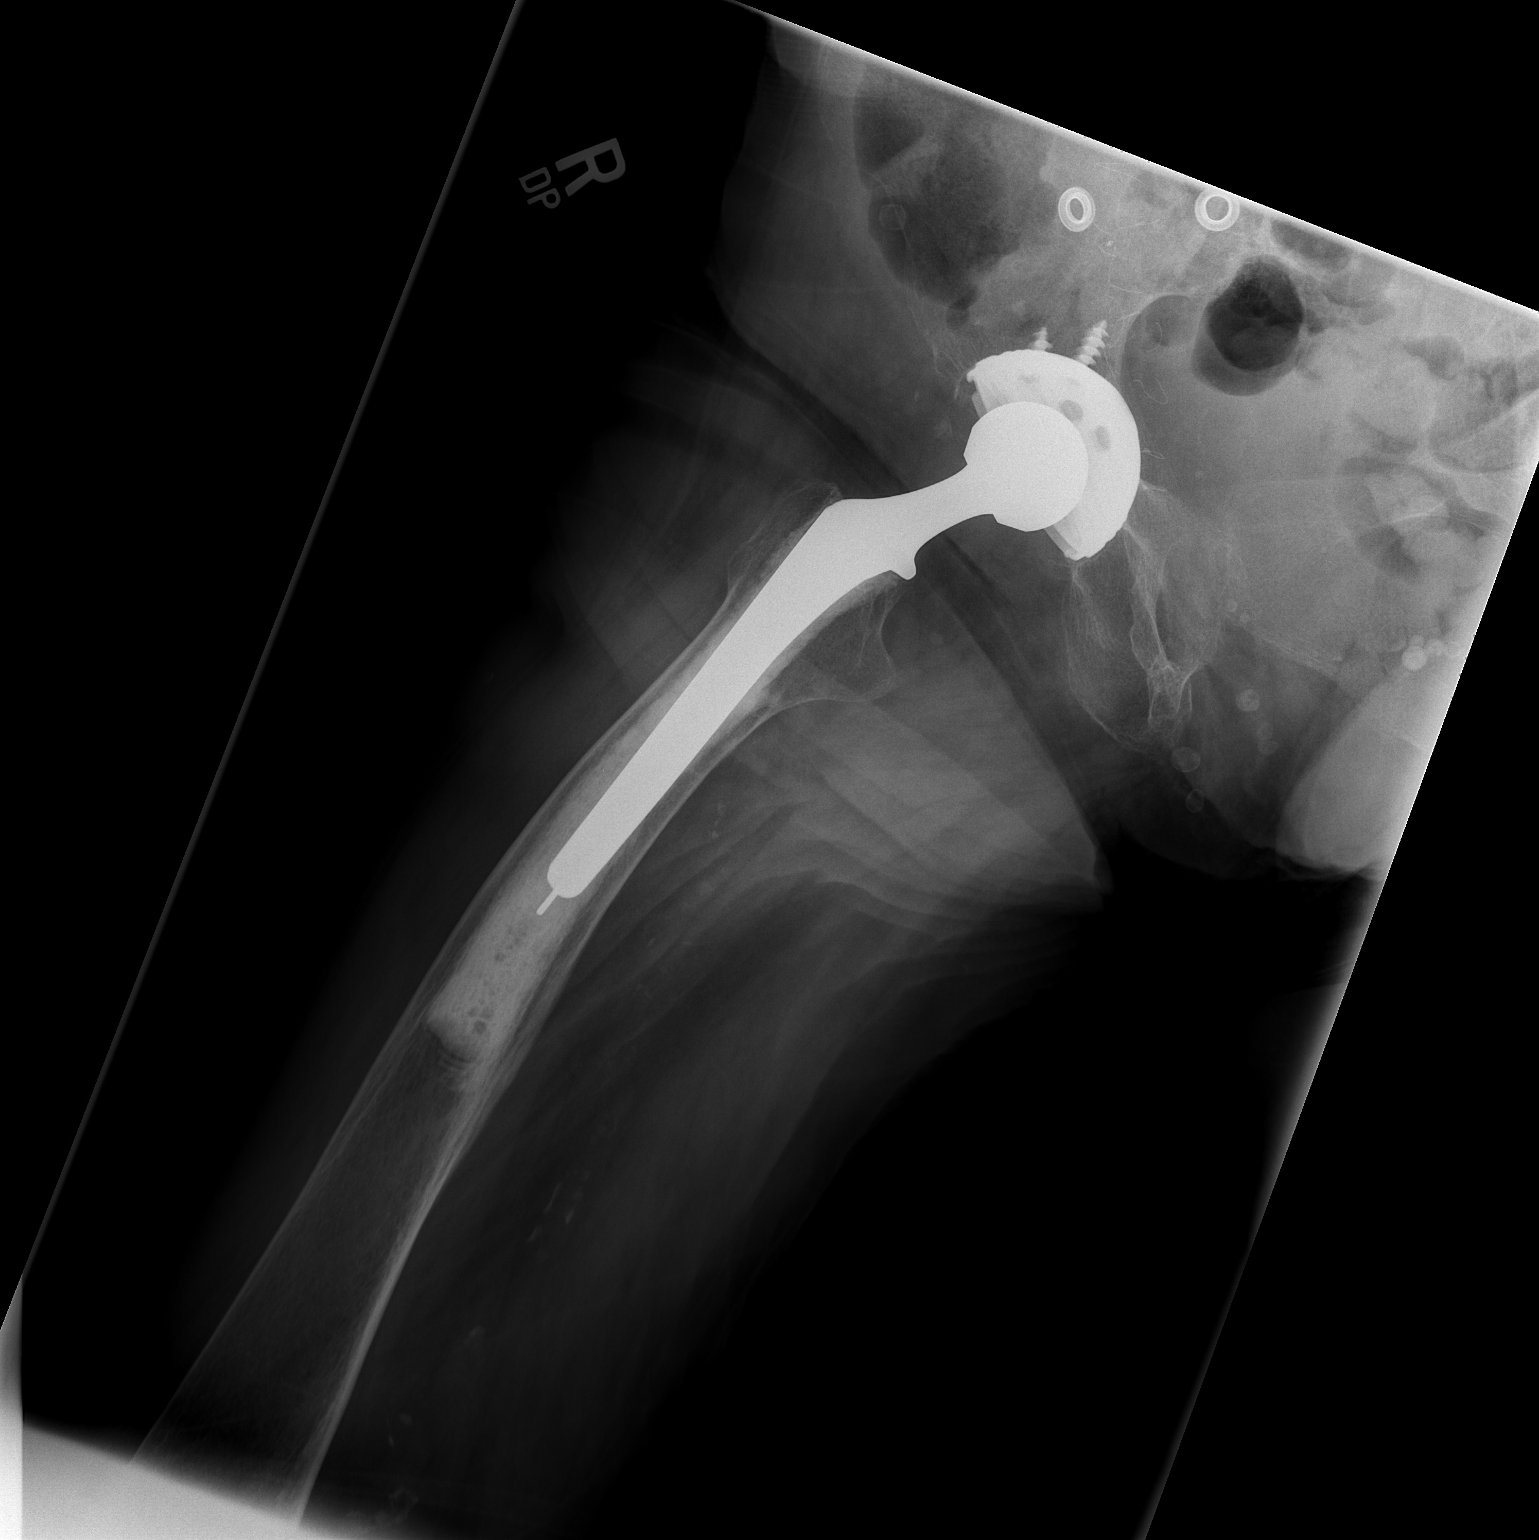

[2 of 2 positions shown; findings below may reference images not displayed]

FINDINGS: The bones are osteopenic. There is no evidence of acute fracture or
dislocation. The patient status post right hip arthroplasty.
Hardware appears intact without evidence of loosening or failure.
Atherosclerotic calcifications are identified.
IMPRESSION: No evidence of acute osseous abnormalities. The patient's hip
prostheses appears intact.

## 2015-06-05 ENCOUNTER — Non-Acute Institutional Stay (SKILLED_NURSING_FACILITY): Payer: Medicare Other | Admitting: Internal Medicine

## 2015-06-05 ENCOUNTER — Encounter: Payer: Self-pay | Admitting: Internal Medicine

## 2015-06-05 DIAGNOSIS — M159 Polyosteoarthritis, unspecified: Secondary | ICD-10-CM | POA: Insufficient documentation

## 2015-06-05 DIAGNOSIS — E440 Moderate protein-calorie malnutrition: Secondary | ICD-10-CM | POA: Insufficient documentation

## 2015-06-05 DIAGNOSIS — K219 Gastro-esophageal reflux disease without esophagitis: Secondary | ICD-10-CM | POA: Insufficient documentation

## 2015-06-05 NOTE — Progress Notes (Signed)
Patient ID: Lindsay Shannon, female   DOB: 13-Feb-1923, 81 y.o.   MRN: [id number]       Facility: [facility]  Chief complaint: medical management of routine health problems  Allergies: reviewed, NKDA  Code status: DNR  HPI 81 y/o female patient is seen for routine visit. 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 She is seen in her room, has been at her baseline and denies any concerns. No new concern from staff. No recent fall or skin concerns reported. No change in functional status or behaviors reported.   Review of Systems  Constitutional: Negative for fever, chills HENT: Negative for congestion, hearing loss and sore throat.   Eyes: Negative for blurred vision. Respiratory: Negative for cough, sputum production, shortness of breath and wheezing.   Cardiovascular: Negative for chest pain, palpitations, leg swelling.  Gastrointestinal: Negative for heartburn, nausea, vomiting, abdominal pain. Genitourinary: Negative for dysuria. Musculoskeletal: Negative for back pain. Skin: Negative for itching and rash.  Neurological: Negative for dizziness Psychiatric/Behavioral: has memory loss. The patient is not nervous/anxious.    Past Medical History  Diagnosis Date  . Hypertension   . Stroke 07/2013  . Chronic kidney disease     CKD stage 4  . Refusal of blood transfusions as patient is Jehovah's Witness   . NEOPLASM, BENIGN, STOMACH 01/30/2004    Qualifier: Diagnosis of  By: Laney Potash, Pam    . GASTRITIS 01/30/2004    Qualifier: Diagnosis of  By: Laney Potash, Pam    . TIA (transient ischemic attack) 07/24/2013  . Benign paroxysmal positional vertigo 01/24/2014  . CATARACTS, BILATERAL 02/08/2008    Qualifier: Diagnosis of  By: Laney Potash, Pam    . Paraproteinemia 09/06/2012   Medication reviewed. See [facility]   Medication List       This list is accurate as of: 06/05/15  4:52 PM.  Always use  your most recent med list.               aspirin EC 81 MG tablet  Take 81 mg by mouth every other day. For PVD     atenolol 50 MG tablet  Commonly known as:  TENORMIN  Take 50 mg by mouth daily.     donepezil 5 MG tablet  Commonly known as:  ARICEPT  Take 5 mg by mouth at bedtime.     escitalopram 10 MG tablet  Commonly known as:  LEXAPRO  Take 10 mg by mouth daily.     loratadine 10 MG tablet  Commonly known as:  CLARITIN  Take 10 mg by mouth daily.     mirtazapine 7.5 MG tablet  Commonly known as:  REMERON  Take 7.5 mg by mouth at bedtime.     pantoprazole 20 MG tablet  Commonly known as:  PROTONIX  Take 20 mg by mouth every other day.     traMADol-acetaminophen 37.5-325 MG per tablet  Commonly known as:  ULTRACET  Take one tablet by mouth twice daily for osteoarthritis of multiple joints and pain. Do not exceed 4gm of Tylenol in 24 hours     TYLENOL 325 MG tablet  Generic drug:  acetaminophen  Take 1 tablet (325 mg total) by mouth 2 (two) times daily.     Vitamin D3 2000 UNITS Tabs  Take by mouth daily. For vitamin D deficiency        Physical exam BP 168/62 mmHg  Temp(Src)  97.1 F (36.2 C) (Oral)  Resp 18  Ht 5\' 1"  (1.549 m)  Wt 119 lb (53.978 kg)  BMI 22.50 kg/m2  SpO2 99%  Wt Readings from Last 3 Encounters:  06/05/15 119 lb (53.978 kg)  03/31/15 120 lb 12.8 oz (54.795 kg)  03/06/15 121 lb 11.2 oz (55.203 kg)   General- elderly female in no acute distress Head- atraumatic, normocephalic Eyes- PERRLA, EOMI, no pallor, no icterus, no discharge Neck- no lymphadenopathy Throat- moist mucus membrane Cardiovascular- normal s1,s2, no murmurs Respiratory- bilateral clear to auscultation, no wheeze, no rhonchi, no crackles, no use of accessory muscles Abdomen- bowel sounds present, soft, non tender Musculoskeletal- able to move her left extremities, right sided hemiparesis (old), no leg edema Skin- warm and dry Psychiatry- alert, normal mood and  affect  Labs 12-12-13: wbc 6.1; hgb 7.4; hct 22.9; mcv 90.2; plt 269; glucose 84; bun 38; creat 3.24; k+4.4; na++147 04-29-14: wbc 5.9; hgb 7.5; hct 24.9; mcv 93.3; plt 277; glucose 80; bun 64; creat 5.0; k+5.3; na++ 140; liver normal albulmin 2.8 05-02-14: wbc 7.0; hgb 7.9; hct 25.8; mcv 93.5;plt 263    05-06-14: glucose 79; bun 62; creat 5.0; k 5.4; na++139 06-20-14: glucose 76; bun 45; creat 4.5;k+5.2; na++141   10-25-14 wbc 6.9, hb 9.1, hct 29.7, mcv 98, plt 235,na 140, k 4.7, glu 73, bun 51, cr 4.9, lft wnl, alb 3.4 01-27-15 hb 10.2, hct 32.1, wbc 5.7, plt 253, iron 46, tibc 204, b12 591, folate 9.3, t.chol 171, tg 106, hdl 42, ldl 108, tsh 5.198 04-28-15 hb 10.3, plt 214, na 145, k 5.1, cr 4.5, glu 77   Assessment/plan  Protein calorie malnutrition With advanced dementia, weight stable. Continue medpass, magic cup and remeron 7.5 mg qhs for now. Monitor weight  gerd Continue protonix 20 mg every other day and monitor.  OA Continue ultracet 37.5-325 bid with prn tylenol, current regimen helpful. Monitor clinically   Blanchie Serve, MD  [facility] [phone] (Monday-Friday 8 am - 5 pm) [phone] (afterhours)

## 2015-07-09 ENCOUNTER — Encounter: Payer: Self-pay | Admitting: Internal Medicine

## 2015-07-09 ENCOUNTER — Non-Acute Institutional Stay (SKILLED_NURSING_FACILITY): Payer: Medicare Other | Admitting: Internal Medicine

## 2015-07-09 DIAGNOSIS — N189 Chronic kidney disease, unspecified: Secondary | ICD-10-CM

## 2015-07-09 DIAGNOSIS — N185 Chronic kidney disease, stage 5: Secondary | ICD-10-CM

## 2015-07-09 DIAGNOSIS — E440 Moderate protein-calorie malnutrition: Secondary | ICD-10-CM

## 2015-07-09 DIAGNOSIS — D631 Anemia in chronic kidney disease: Secondary | ICD-10-CM | POA: Diagnosis not present

## 2015-07-09 DIAGNOSIS — F330 Major depressive disorder, recurrent, mild: Secondary | ICD-10-CM | POA: Diagnosis not present

## 2015-07-09 NOTE — Progress Notes (Signed)
Patient ID: Lindsay Shannon, female   DOB: [birth year], 81 y.o.   MRN: [id number]       Facility: [facility] : [facility]  Chief complaint: medical management of routine health problems  Allergies: reviewed, NKDA  Code status: DNR  HPI 81 y/o female patient is seen for routine visit. She has been at her baseline and denies any concerns. No new concern from staff. No recent fall or skin concerns reported. No change in functional status or behaviors reported. She has been losing weight. Has refused dialysis and has ESRD.  Review of Systems  Constitutional: Negative for fever, chills HENT: Negative for congestion, hearing loss and sore throat.   Eyes: Negative for blurred vision. Respiratory: Negative for cough, sputum production, shortness of breath and wheezing.   Cardiovascular: Negative for chest pain, palpitations, leg swelling.  Gastrointestinal: Negative for heartburn, nausea, vomiting, abdominal pain. Genitourinary: Negative for dysuria. Musculoskeletal: Negative for back pain. Skin: Negative for itching and rash.  Neurological: Negative for dizziness Psychiatric/Behavioral: has memory loss. The patient is not nervous/anxious.    Past Medical History  Diagnosis Date  . Hypertension   . Stroke 07/2013  . Chronic kidney disease     CKD stage 4  . Refusal of blood transfusions as patient is Jehovah's Witness   . NEOPLASM, BENIGN, STOMACH 01/30/2004    Qualifier: Diagnosis of  By: Laney Potash, Pam    . GASTRITIS 01/30/2004    Qualifier: Diagnosis of  By: Laney Potash, Pam    . TIA (transient ischemic attack) 07/24/2013  . Benign paroxysmal positional vertigo 01/24/2014  . CATARACTS, BILATERAL 02/08/2008    Qualifier: Diagnosis of  By: Laney Potash, Pam    . Paraproteinemia 09/06/2012   Medication reviewed. See [facility]   Medication List       This list is accurate as of: 07/09/15  2:37 PM.  Always use your most recent med list.               aspirin EC 81 MG tablet  Take 81 mg by mouth every other day. For PVD     atenolol 50 MG tablet  Commonly known as:  TENORMIN  Take 50 mg by mouth daily.     donepezil 5 MG tablet  Commonly known as:  ARICEPT  Take 5 mg by mouth at bedtime.     escitalopram 10 MG tablet  Commonly known as:  LEXAPRO  Take 10 mg by mouth daily.     loratadine 10 MG tablet  Commonly known as:  CLARITIN  Take 10 mg by mouth daily.     mirtazapine 7.5 MG tablet  Commonly known as:  REMERON  Take 7.5 mg by mouth at bedtime.     pantoprazole 20 MG tablet  Commonly known as:  PROTONIX  Take 20 mg by mouth every other day.     traMADol-acetaminophen 37.5-325 MG per tablet  Commonly known as:  ULTRACET  Take one tablet by mouth twice daily for osteoarthritis of multiple joints and pain. Do not exceed 4gm of Tylenol in 24 hours     TYLENOL 325 MG tablet  Generic drug:  acetaminophen  Take 325 mg by mouth 2 (two) times daily as needed.     Vitamin D3 2000 UNITS Tabs  Take by mouth daily. For vitamin D deficiency        Physical exam BP 109/60 mmHg  Pulse 75  Temp(Src) 97.8 F (36.6 C) (Oral)  Resp 16  Ht 5\' 1"  (1.549 m)  Wt 113 lb (51.256 kg)  BMI 21.36 kg/m2  SpO2 97%  Wt Readings from Last 3 Encounters:  07/09/15 113 lb (51.256 kg)  06/05/15 119 lb (53.978 kg)  03/31/15 120 lb 12.8 oz (54.795 kg)   General- elderly female in no acute distress Head- atraumatic, normocephalic Eyes- PERRLA, EOMI, no pallor, no icterus, no discharge Neck- no lymphadenopathy Throat- moist mucus membrane Cardiovascular- normal s1,s2, no murmurs Respiratory- bilateral clear to auscultation, no wheeze, no rhonchi, no crackles, no use of accessory muscles Abdomen- bowel sounds present, soft, non tender Musculoskeletal- able to move her left extremities, right sided hemiparesis (old), no leg edema Skin- warm and dry Psychiatry- alert, normal mood and affect  Labs 12-12-13: wbc 6.1; hgb 7.4; hct 22.9;  mcv 90.2; plt 269; glucose 84; bun 38; creat 3.24; k+4.4; na++147 04-29-14: wbc 5.9; hgb 7.5; hct 24.9; mcv 93.3; plt 277; glucose 80; bun 64; creat 5.0; k+5.3; na++ 140; liver normal albulmin 2.8 05-02-14: wbc 7.0; hgb 7.9; hct 25.8; mcv 93.5;plt 263    05-06-14: glucose 79; bun 62; creat 5.0; k 5.4; na++139 06-20-14: glucose 76; bun 45; creat 4.5;k+5.2; na++141   10-25-14 wbc 6.9, hb 9.1, hct 29.7, mcv 98, plt 235,na 140, k 4.7, glu 73, bun 51, cr 4.9, lft wnl, alb 3.4 01-27-15 hb 10.2, hct 32.1, wbc 5.7, plt 253, iron 46, tibc 204, b12 591, folate 9.3, t.chol 171, tg 106, hdl 42, ldl 108, tsh 5.198 04-28-15 hb 10.3, plt 214, na 145, k 5.1, cr 4.5, glu 77   Assessment/plan  Major depression Stable, continue lexapro 10 mg daily with remeron and monitor  Protein calorie malnutrition With advanced dementia, decline anticipated. Continue medpass, magic cup and remeron 7.5 mg qhs for now. Monitor weight  ckd stage 5 Has refused dialysis. Comfort care is the goal  Anemia of chronic disease Monitor h&h, Jehovah's witness. Last hb 10.3, goal hb > 10.    Blanchie Serve, MD  [facility] [phone] (Monday-Friday 8 am - 5 pm) [phone] (afterhours)

## 2015-08-04 ENCOUNTER — Non-Acute Institutional Stay (SKILLED_NURSING_FACILITY): Payer: Medicare Other | Admitting: Internal Medicine

## 2015-08-04 DIAGNOSIS — F322 Major depressive disorder, single episode, severe without psychotic features: Secondary | ICD-10-CM | POA: Diagnosis not present

## 2015-08-04 DIAGNOSIS — K219 Gastro-esophageal reflux disease without esophagitis: Secondary | ICD-10-CM | POA: Diagnosis not present

## 2015-08-04 DIAGNOSIS — J309 Allergic rhinitis, unspecified: Secondary | ICD-10-CM | POA: Diagnosis not present

## 2015-08-04 DIAGNOSIS — F329 Major depressive disorder, single episode, unspecified: Secondary | ICD-10-CM

## 2015-08-04 DIAGNOSIS — I69391 Dysphagia following cerebral infarction: Secondary | ICD-10-CM | POA: Diagnosis not present

## 2015-08-04 NOTE — Progress Notes (Signed)
Patient ID: Lindsay Shannon, female   DOB: [birth year], 81 y.o.   MRN: [id number]      [place] [facility] Service: SNF (31)    PCP: Blanchie Serve, MD   No Known Allergies  Chief Complaint  Patient presents with  . Medical Management of Chronic Issues    Code status: DNR  HPI 81 y/o female patient is seen for routine visit. She has been at her baseline and denies any concerns. She is feeding herself. No falls reported. No new skin concerns. Her reflux symptom is controlled. Mood has been fair. No new concern from staff.   Review of Systems  Constitutional: Negative for fever, chills HENT: Negative for congestion, hearing loss and sore throat.   Eyes: Negative for blurred vision. Respiratory: Negative for cough, sputum production, shortness of breath and wheezing.   Cardiovascular: Negative for chest pain, palpitations, leg swelling.  Gastrointestinal: Negative for heartburn, nausea, vomiting, abdominal pain. Genitourinary: Negative for dysuria. Musculoskeletal: Negative for back pain. Skin: Negative for itching and rash.  Neurological: Negative for dizziness Psychiatric/Behavioral: has memory loss. The patient is not nervous/anxious.     Past Medical History  Diagnosis Date  . Hypertension   . Stroke 07/2013  . Chronic kidney disease     CKD stage 4  . Refusal of blood transfusions as patient is Jehovah's Witness   . NEOPLASM, BENIGN, STOMACH 01/30/2004    Qualifier: Diagnosis of  By: Laney Potash, Pam    . GASTRITIS 01/30/2004    Qualifier: Diagnosis of  By: Laney Potash, Pam    . TIA (transient ischemic attack) 07/24/2013  . Benign paroxysmal positional vertigo 01/24/2014  . CATARACTS, BILATERAL 02/08/2008    Qualifier: Diagnosis of  By: Laney Potash, Pam    . Paraproteinemia 09/06/2012     Past Surgical History  Procedure Laterality Date  . Partial hip arthroplasty    . Replacement total knee       Medications:   Medication  List       This list is accurate as of: 08/04/15  3:36 PM.  Always use your most recent med list.               aspirin EC 81 MG tablet  Take 81 mg by mouth every other day. For PVD     atenolol 50 MG tablet  Commonly known as:  TENORMIN  Take 50 mg by mouth daily.     donepezil 5 MG tablet  Commonly known as:  ARICEPT  Take 5 mg by mouth at bedtime.     escitalopram 10 MG tablet  Commonly known as:  LEXAPRO  Take 10 mg by mouth daily.     loratadine 10 MG tablet  Commonly known as:  CLARITIN  Take 10 mg by mouth daily.     mirtazapine 7.5 MG tablet  Commonly known as:  REMERON  Take 7.5 mg by mouth at bedtime.     pantoprazole 20 MG tablet  Commonly known as:  PROTONIX  Take 20 mg by mouth every other day.     traMADol-acetaminophen 37.5-325 MG per tablet  Commonly known as:  ULTRACET  Take one tablet by mouth twice daily for osteoarthritis of multiple joints and pain. Do not exceed 4gm of Tylenol in 24 hours     TYLENOL 325 MG tablet  Generic drug:  acetaminophen  Take 325 mg by mouth 2 (two) times daily as needed.  Vitamin D3 2000 UNITS Tabs  Take by mouth daily. For vitamin D deficiency        Immunizations: Immunization History  Administered Date(s) Administered  . Influenza-Unspecified 08/19/2014  . PPD Test 04/25/2014, 05/21/2014  . Pneumococcal-Unspecified 08/28/2014    Physical Exam: Filed Vitals:   08/04/15 1535  BP: 109/63  Pulse: 65  Temp: 98.2 F (36.8 C)  Resp: 18  Weight: 116 lb 4.8 oz (52.753 kg)  SpO2: 96%   Body mass index is 21.99 kg/(m^2).  General- elderly female in no acute distress Head- atraumatic, normocephalic Eyes- PERRLA, EOMI, no pallor, no icterus, no discharge Neck- no lymphadenopathy Throat- moist mucus membrane Cardiovascular- normal s1,s2, no murmurs Respiratory- bilateral clear to auscultation, no wheeze, no rhonchi, no crackles, no use of accessory muscles Abdomen- bowel sounds present, soft, non  tender Musculoskeletal- able to move her left extremities, right sided hemiparesis (old), no leg edema Skin- warm and dry Psychiatry- alert, normal mood and affect  Labs 12-12-13: wbc 6.1; hgb 7.4; hct 22.9; mcv 90.2; plt 269; glucose 84; bun 38; creat 3.24; k+4.4; na++147 04-29-14: wbc 5.9; hgb 7.5; hct 24.9; mcv 93.3; plt 277; glucose 80; bun 64; creat 5.0; k+5.3; na++ 140; liver normal albulmin 2.8 05-02-14: wbc 7.0; hgb 7.9; hct 25.8; mcv 93.5;plt 263    05-06-14: glucose 79; bun 62; creat 5.0; k 5.4; na++139 06-20-14: glucose 76; bun 45; creat 4.5;k+5.2; na++141   10-25-14 wbc 6.9, hb 9.1, hct 29.7, mcv 98, plt 235,na 140, k 4.7, glu 73, bun 51, cr 4.9, lft wnl, alb 3.4 01-27-15 hb 10.2, hct 32.1, wbc 5.7, plt 253, iron 46, tibc 204, b12 591, folate 9.3, t.chol 171, tg 106, hdl 42, ldl 108, tsh 5.198 04-28-15 hb 10.3, plt 214, na 145, k 5.1, cr 4.5, glu 77   Assessment/plan  gerd Stable, d/c protonix and monitor clinically  Chronic major depression Stable, d/clexapro in attempt of GDR. If develops active sign of depression, consider increasing dosing of remeron  Allergic rhinitis Stable with claritin 10 mg daily for now  Dysphagia Aspiration precautions, mechanical soft diet for now and monitor  Blanchie Serve, MD  [facility] [address], [place] [address] Monday-Friday 8 am - 5 pm: [phone] Afterhours: [phone]  Fax: [phone]

## 2015-09-09 DEATH — deceased

## 2015-09-23 ENCOUNTER — Encounter: Payer: Self-pay | Admitting: Cardiology
